# Patient Record
Sex: Female | Born: 1980 | Race: Black or African American | Hispanic: No | Marital: Married | Smoking: Never smoker
Health system: Southern US, Community
[De-identification: ages and names within clinical notes are randomized; demographics above are authoritative.]

## PROBLEM LIST (undated history)

## (undated) ENCOUNTER — Inpatient Hospital Stay (HOSPITAL_COMMUNITY): Payer: Self-pay

## (undated) DIAGNOSIS — R768 Other specified abnormal immunological findings in serum: Secondary | ICD-10-CM

## (undated) DIAGNOSIS — N971 Female infertility of tubal origin: Secondary | ICD-10-CM

## (undated) DIAGNOSIS — Z3183 Encounter for assisted reproductive fertility procedure cycle: Secondary | ICD-10-CM

## (undated) DIAGNOSIS — R51 Headache: Secondary | ICD-10-CM

## (undated) DIAGNOSIS — O00101 Right tubal pregnancy without intrauterine pregnancy: Secondary | ICD-10-CM

## (undated) DIAGNOSIS — D649 Anemia, unspecified: Secondary | ICD-10-CM

## (undated) DIAGNOSIS — E282 Polycystic ovarian syndrome: Secondary | ICD-10-CM

## (undated) DIAGNOSIS — I10 Essential (primary) hypertension: Secondary | ICD-10-CM

## (undated) DIAGNOSIS — F419 Anxiety disorder, unspecified: Secondary | ICD-10-CM

## (undated) DIAGNOSIS — N839 Noninflammatory disorder of ovary, fallopian tube and broad ligament, unspecified: Secondary | ICD-10-CM

## (undated) DIAGNOSIS — F32A Depression, unspecified: Secondary | ICD-10-CM

## (undated) HISTORY — DX: Right tubal pregnancy without intrauterine pregnancy: O00.101

## (undated) HISTORY — DX: Depression, unspecified: F32.A

## (undated) HISTORY — DX: Noninflammatory disorder of ovary, fallopian tube and broad ligament, unspecified: N83.9

## (undated) HISTORY — DX: Anxiety disorder, unspecified: F41.9

## (undated) HISTORY — DX: Encounter for assisted reproductive fertility procedure cycle: Z31.83

## (undated) HISTORY — DX: Headache: R51

## (undated) HISTORY — DX: Other specified abnormal immunological findings in serum: R76.8

---

## 2002-02-10 DIAGNOSIS — G4730 Sleep apnea, unspecified: Secondary | ICD-10-CM

## 2002-02-10 HISTORY — DX: Sleep apnea, unspecified: G47.30

## 2002-02-10 HISTORY — PX: GASTRIC BYPASS: SHX52

## 2005-09-13 ENCOUNTER — Emergency Department (HOSPITAL_COMMUNITY): Admission: EM | Admit: 2005-09-13 | Discharge: 2005-09-13 | Payer: Self-pay | Admitting: *Deleted

## 2005-09-16 ENCOUNTER — Emergency Department: Payer: Self-pay | Admitting: Emergency Medicine

## 2005-10-18 ENCOUNTER — Emergency Department: Payer: Self-pay | Admitting: Emergency Medicine

## 2005-11-01 ENCOUNTER — Emergency Department: Payer: Self-pay | Admitting: Emergency Medicine

## 2005-12-10 ENCOUNTER — Emergency Department: Payer: Self-pay | Admitting: Internal Medicine

## 2005-12-13 ENCOUNTER — Emergency Department: Payer: Self-pay

## 2006-01-27 ENCOUNTER — Emergency Department: Payer: Self-pay | Admitting: Emergency Medicine

## 2008-03-13 ENCOUNTER — Emergency Department: Payer: Self-pay | Admitting: Emergency Medicine

## 2009-05-11 ENCOUNTER — Ambulatory Visit: Payer: Self-pay | Admitting: Internal Medicine

## 2009-09-30 ENCOUNTER — Emergency Department: Payer: Self-pay | Admitting: Internal Medicine

## 2010-02-11 ENCOUNTER — Emergency Department: Payer: Self-pay | Admitting: Emergency Medicine

## 2010-03-26 ENCOUNTER — Other Ambulatory Visit (HOSPITAL_COMMUNITY): Payer: Self-pay | Admitting: Obstetrics & Gynecology

## 2010-03-26 DIAGNOSIS — N979 Female infertility, unspecified: Secondary | ICD-10-CM

## 2010-04-02 ENCOUNTER — Ambulatory Visit (HOSPITAL_COMMUNITY)
Admission: RE | Admit: 2010-04-02 | Discharge: 2010-04-02 | Disposition: A | Discharge status: Home / Self Care | Payer: BC Managed Care – PPO | Source: Ambulatory Visit | Attending: Obstetrics & Gynecology | Admitting: Obstetrics & Gynecology

## 2010-04-02 DIAGNOSIS — N979 Female infertility, unspecified: Secondary | ICD-10-CM

## 2010-07-17 ENCOUNTER — Other Ambulatory Visit (HOSPITAL_COMMUNITY): Payer: BC Managed Care – PPO

## 2010-07-17 ENCOUNTER — Encounter (HOSPITAL_COMMUNITY): Payer: BC Managed Care – PPO

## 2010-07-17 ENCOUNTER — Other Ambulatory Visit: Payer: Self-pay | Admitting: Obstetrics and Gynecology

## 2010-07-17 LAB — CBC
HCT: 31.3 % — ABNORMAL LOW (ref 36.0–46.0)
Hemoglobin: 9 g/dL — ABNORMAL LOW (ref 12.0–15.0)
MCH: 18.7 pg — ABNORMAL LOW (ref 26.0–34.0)
MCHC: 28.8 g/dL — ABNORMAL LOW (ref 30.0–36.0)
MCV: 65.1 fL — ABNORMAL LOW (ref 78.0–100.0)
Platelets: 256 10*3/uL (ref 150–400)
RBC: 4.81 MIL/uL (ref 3.87–5.11)
RDW: 19.8 % — ABNORMAL HIGH (ref 11.5–15.5)
WBC: 6.3 10*3/uL (ref 4.0–10.5)

## 2010-07-17 LAB — BASIC METABOLIC PANEL
BUN: 11 mg/dL (ref 6–23)
CO2: 27 mEq/L (ref 19–32)
Calcium: 8.9 mg/dL (ref 8.4–10.5)
Chloride: 107 mEq/L (ref 96–112)
Creatinine, Ser: 0.51 mg/dL (ref 0.4–1.2)
GFR calc Af Amer: >60 mL/min (ref >60)
GFR calc non Af Amer: >60 mL/min (ref >60)
Glucose, Bld: 76 mg/dL (ref 70–99)
Potassium: 4.3 mEq/L (ref 3.5–5.1)
Sodium: 140 mEq/L (ref 135–145)

## 2010-07-17 LAB — SURGICAL PCR SCREEN
MRSA, PCR: NEGATIVE
Staphylococcus aureus: NEGATIVE

## 2010-07-24 ENCOUNTER — Ambulatory Visit (HOSPITAL_COMMUNITY)
Admission: RE | Admit: 2010-07-24 | Discharge: 2010-07-24 | Disposition: A | Discharge status: Home / Self Care | Payer: BC Managed Care – PPO | Source: Ambulatory Visit | Attending: Obstetrics and Gynecology | Admitting: Obstetrics and Gynecology

## 2010-07-24 ENCOUNTER — Ambulatory Visit (HOSPITAL_COMMUNITY)
Admission: RE | Admit: 2010-07-24 | Payer: BC Managed Care – PPO | Source: Ambulatory Visit | Admitting: Obstetrics and Gynecology

## 2010-07-24 ENCOUNTER — Other Ambulatory Visit: Payer: Self-pay | Admitting: Obstetrics and Gynecology

## 2010-07-24 DIAGNOSIS — N840 Polyp of corpus uteri: Secondary | ICD-10-CM | POA: Insufficient documentation

## 2010-07-24 DIAGNOSIS — N7013 Chronic salpingitis and oophoritis: Secondary | ICD-10-CM | POA: Insufficient documentation

## 2010-07-24 HISTORY — PX: DIAGNOSTIC LAPAROSCOPY: SUR761

## 2010-07-24 LAB — PREGNANCY, URINE: Preg Test, Ur: NEGATIVE

## 2010-09-14 NOTE — Op Note (Signed)
NAMECAMERON, Marilyn Wilkinson                 ACCOUNT NO.:  [id number]  MEDICAL RECORD NO.:  [id number]  LOCATION:  [facility]                          FACILITY:  [facility]  PHYSICIAN:  Fermin Schwab, MD   DATE OF BIRTH:  1980/12/27  DATE OF PROCEDURE:  07/24/2010 DATE OF DISCHARGE:  07/24/2010                              OPERATIVE REPORT   PREOPERATIVE DIAGNOSES:  Bilateral hydrosalpinx, endometrial polyp.  POSTOPERATIVE DIAGNOSES:  Bilateral hydrosalpinx and pelvic adhesions.  PROCEDURE:  Hysteroscopy, endometrial biopsy, laparoscopy, electrosurgical lysis of adhesions, left salpingectomy, right salpingoneostomy.  SURGEON:  Fermin Schwab, MD  ASSISTANT:  None.  ANESTHESIA:  General endotracheal.  COMPLICATIONS:  None.  SPECIMENS:  Endometrial biopsy and left tube to Pathology.  ESTIMATED BLOOD LOSS:  Minimal.  FINDINGS:  On exam under anesthesia, the uterus was normal size anteverted.  No adnexal masses were palpable.  External genitalia and Bartholin, Skene, urethra were normal.  Vagina was normal.  Ectocervix was normal.  On lap hysteroscopy, endocervical canal was normal.  The uterus sounded to 8 cm.  Hysteroscopy revealed normal endometrial cavity without polyps.  Both tubal ostia were seen.  On laparoscopy, left peri-hepatic adhesions were noted between the liver capsule and diaphragm.  The right lobe of the liver was normal. Gallbladder, appendix, and rest of the diaphragm were normal.  Examination of the pelvic cavity revealed the following:  The anterior cul-de-sac was normal.  The uterus was grossly normal.  The posterior cul-de-sac was normal.  Both tubes revealed severe hydrosalpinx with comminution of the fallopian tubes and distal dilation.  The diameter on the right was 1.5 cm.  The diameter on the left hydrosalpinx was 1 cm. The left ovary had 6% cover with dense adhesions.  The left tube had the one-third cover with filmy adhesions.  In addition, the left tube  did not show filling with chromotubation.  The right tube was thin-walled and had one-third cover with filmy adhesions.  With chromotubation, the right tube did fill.  On salpingoneostomy 50% preservation of the tubal folds within the right tubal ampulla was confirmed.  DESCRIPTION OF PROCEDURE:  The patient was placed in dorsal supine position.  General endotracheal anesthesia was given, 1 gram of Kefzol was given intravenously for prophylaxis.  She was then placed in lithotomy position and operative field was created both on the abdomen and perineum and inside the vagina.  She was draped in a sterile manner. Exam under anesthesia showed above findings.  A Foley catheter was inserted into the bladder.  A slimline hysteroscope with normal saline via hysteroscopic pump was used for video hysteroscopy.  Above findings were noted.  As there were no polyps to remove, an endometrial biopsy was done.  This part of the procedure was terminated and the surgeon was re-gloved and operative field was created on the abdomen.  After preemptive anesthesia with 0.25% percent bupivacaine and 1:200,000 epinephrine for all incisions, a 5 mm infraumbilical skin incision was made, and a Veress needle was inserted.  After verifying its correct location and pneumoperitoneum was created with carbon dioxide.  A 5-mm trocar was inserted and video laparoscopy was started with the 30-degree laparoscope.  Above findings  were noted.  A 5-mm trocar was inserted under direct visualization in each lower quadrant for ancillary instruments.  Decision was made to remove the left hydrosalpinx that did not fill on chromotubation.  This was accomplished with Harmonic Ace device by first transecting the left tube at the uterotubal junction and carrying out successive coagulation and cutting maximum setting along the mesosalpinx, staying well away from the ovarian collaterals in the mesosalpinx.  The coagulations stayed  well away from the left infundibular pelvic ligament, trying not to harm ovarian circulation.  Using needle electrode with a 35 watts cutting current, the salpingo- oophorolysis was performed on the right adnexa.  A dilute vasopressin injection was injected (0.4 units per mL) in to the right mesosalpinx for tubal blanching.  Next, a cruciate incision was made on the most distal aspect of this clubbed tube where the fimbria were supposed to be using cutting current on a needle electrode and penetrating only through the muscularis layer of the thin-walled tube.  Using the hook scissors, this incision was carried into the tubal lumen sharply.  The resulting flaps of the tube were everted back and the serosal edges were secured to the distal ampullary serosa with a suture of 6-0 Vicryl, 6-0 Prolene. Chromotubation revealed free flow of indigo carmine from this tube, although portions of the tube were still ballooning during the process. Hemostasis was ensured and a slurry of Seprafilm was instilled over both adnexa after pelvis was copiously irrigated and aspirated.  Instruments were removed, and the gas was allowed to come out.  The incisions were approximated with Dermabond.  Estimated blood loss was less than 20 mL. The patient tolerated the procedure well and was transferred to recovery room in satisfactory condition.  She will take doxycycline 100 mg tablets twice daily for 7 days postoperatively because of apparent evidence of untreated pelvic inflammatory disease.     Fermin Schwab, MD     TY/MEDQ  D:  09/13/2010  T:  09/14/2010  Job:  [id number]  cc:   Dr. Gevena Cotton

## 2011-02-17 ENCOUNTER — Other Ambulatory Visit: Payer: Self-pay | Admitting: Obstetrics & Gynecology

## 2011-02-17 ENCOUNTER — Ambulatory Visit (HOSPITAL_COMMUNITY)
Admission: RE | Admit: 2011-02-17 | Discharge: 2011-02-17 | Disposition: A | Discharge status: Home / Self Care | Payer: BC Managed Care – PPO | Source: Ambulatory Visit | Attending: Obstetrics & Gynecology | Admitting: Obstetrics & Gynecology

## 2011-02-17 ENCOUNTER — Ambulatory Visit: Payer: BC Managed Care – PPO | Admitting: Gynecology

## 2011-02-17 VITALS — BP 123/88 | Wt 223.0 lb

## 2011-02-17 DIAGNOSIS — Z3689 Encounter for other specified antenatal screening: Secondary | ICD-10-CM | POA: Insufficient documentation

## 2011-02-17 DIAGNOSIS — Z3400 Encounter for supervision of normal first pregnancy, unspecified trimester: Secondary | ICD-10-CM

## 2011-02-17 DIAGNOSIS — O3680X0 Pregnancy with inconclusive fetal viability, not applicable or unspecified: Secondary | ICD-10-CM

## 2011-02-17 LAB — POCT URINE PREGNANCY: Preg Test, Ur: POSITIVE

## 2011-02-18 LAB — OBSTETRIC PANEL
Antibody Screen: NEGATIVE
Basophils Absolute: 0 10*3/uL (ref 0.0–0.1)
Basophils Relative: 0 % (ref 0–1)
Eosinophils Absolute: 0.2 10*3/uL (ref 0.0–0.7)
Eosinophils Relative: 2 % (ref 0–5)
HCT: 37.4 % (ref 36.0–46.0)
Hemoglobin: 11.5 g/dL — ABNORMAL LOW (ref 12.0–15.0)
Hepatitis B Surface Ag: NEGATIVE
Lymphocytes Relative: 33 % (ref 12–46)
Lymphs Abs: 3.6 10*3/uL (ref 0.7–4.0)
MCH: 21.1 pg — ABNORMAL LOW (ref 26.0–34.0)
MCHC: 30.7 g/dL (ref 30.0–36.0)
MCV: 68.8 fL — ABNORMAL LOW (ref 78.0–100.0)
Monocytes Absolute: 1.1 10*3/uL — ABNORMAL HIGH (ref 0.1–1.0)
Monocytes Relative: 10 % (ref 3–12)
Neutro Abs: 6.2 10*3/uL (ref 1.7–7.7)
Neutrophils Relative %: 56 % (ref 43–77)
Platelets: 307 10*3/uL (ref 150–400)
RBC: 5.44 MIL/uL — ABNORMAL HIGH (ref 3.87–5.11)
RDW: 22.1 % — ABNORMAL HIGH (ref 11.5–15.5)
Rh Type: POSITIVE
Rubella: 193.7 IU/mL — ABNORMAL HIGH
WBC: 11.1 10*3/uL — ABNORMAL HIGH (ref 4.0–10.5)

## 2011-02-18 LAB — HIV ANTIBODY (ROUTINE TESTING W REFLEX): HIV: NONREACTIVE

## 2011-02-18 LAB — HEPATITIS C ANTIBODY: HCV Ab: NEGATIVE

## 2011-02-19 LAB — CULTURE, URINE COMPREHENSIVE
Colony Count: NO GROWTH
Organism ID, Bacteria: NO GROWTH

## 2011-03-03 ENCOUNTER — Ambulatory Visit (INDEPENDENT_AMBULATORY_CARE_PROVIDER_SITE_OTHER): Payer: BC Managed Care – PPO | Admitting: Family Medicine

## 2011-03-03 ENCOUNTER — Encounter: Payer: Self-pay | Admitting: Family Medicine

## 2011-03-03 DIAGNOSIS — Z1272 Encounter for screening for malignant neoplasm of vagina: Secondary | ICD-10-CM

## 2011-03-03 DIAGNOSIS — O30049 Twin pregnancy, dichorionic/diamniotic, unspecified trimester: Secondary | ICD-10-CM | POA: Insufficient documentation

## 2011-03-03 DIAGNOSIS — O30009 Twin pregnancy, unspecified number of placenta and unspecified number of amniotic sacs, unspecified trimester: Secondary | ICD-10-CM

## 2011-03-03 DIAGNOSIS — Z113 Encounter for screening for infections with a predominantly sexual mode of transmission: Secondary | ICD-10-CM

## 2011-03-03 DIAGNOSIS — E282 Polycystic ovarian syndrome: Secondary | ICD-10-CM

## 2011-03-03 MED ORDER — PROMETHAZINE HCL 25 MG PO TABS: 25.0000 mg | ORAL_TABLET | Freq: Four times a day (QID) | ORAL | Status: AC | PRN

## 2011-03-03 NOTE — Patient Instructions (Signed)
Pregnancy - First Trimester During sexual intercourse, millions of sperm go into the vagina. Only 1 sperm will penetrate and fertilize the female egg while it is in the Fallopian tube. One week later, the fertilized egg implants into the wall of the uterus. An embryo begins to develop into a baby. At 6 to 8 weeks, the eyes and face are formed and the heartbeat can be seen on ultrasound. At the end of 12 weeks (first trimester), all the baby's organs are formed. Now that you are pregnant, you will want to do everything you can to have a healthy baby. Two of the most important things are to get good prenatal care and follow your caregiver's instructions. Prenatal care is all the medical care you receive before the baby's birth. It is given to prevent, find, and treat problems during the pregnancy and childbirth. PRENATAL EXAMS  During prenatal visits, your weight, blood pressure and urine are checked. This is done to make sure you are healthy and progressing normally during the pregnancy.   A pregnant woman should gain 25 to 35 pounds during the pregnancy. However, if you are over weight or underweight, your caregiver will advise you regarding your weight.   Your caregiver will ask and answer questions for you.   Blood work, cervical cultures, other necessary tests and a Pap test are done during your prenatal exams. These tests are done to check on your health and the probable health of your baby. Tests are strongly recommended and done for HIV with your permission. This is the virus that causes AIDS. These tests are done because medications can be given to help prevent your baby from being born with this infection should you have been infected without knowing it. Blood work is also used to find out your blood type, previous infections and follow your blood levels (hemoglobin).   Low hemoglobin (anemia) is common during pregnancy. Iron and vitamins are given to help prevent this. Later in the pregnancy,  blood tests for diabetes will be done along with any other tests if any problems develop. You may need tests to make sure you and the baby are doing well.   You may need other tests to make sure you and the baby are doing well.  CHANGES DURING THE FIRST TRIMESTER (THE FIRST 3 MONTHS OF PREGNANCY) Your body goes through many changes during pregnancy. They vary from person to person. Talk to your caregiver about changes you notice and are concerned about. Changes can include:  Your menstrual period stops.   The egg and sperm carry the genes that determine what you look like. Genes from you and your partner are forming a baby. The female genes determine whether the baby is a boy or a girl.   Your body increases in girth and you may feel bloated.   Feeling sick to your stomach (nauseous) and throwing up (vomiting). If the vomiting is uncontrollable, call your caregiver.   Your breasts will begin to enlarge and become tender.   Your nipples may stick out more and become darker.   The need to urinate more. Painful urination may mean you have a bladder infection.   Tiring easily.   Loss of appetite.   Cravings for certain kinds of food.   At first, you may gain or lose a couple of pounds.   You may have changes in your emotions from day to day (excited to be pregnant or concerned something may go wrong with the pregnancy and baby).     You may have more vivid and strange dreams.  HOME CARE INSTRUCTIONS   It is very important to avoid all smoking, alcohol and un-prescribed drugs during your pregnancy. These affect the formation and growth of the baby. Avoid chemicals while pregnant to ensure the delivery of a healthy infant.   Start your prenatal visits by the 12th week of pregnancy. They are usually scheduled monthly at first, then more often in the last 2 months before delivery. Keep your caregiver's appointments. Follow your caregiver's instructions regarding medication use, blood and lab  tests, exercise, and diet.   During pregnancy, you are providing food for you and your baby. Eat regular, well-balanced meals. Choose foods such as meat, fish, milk and other low fat dairy products, vegetables, fruits, and whole-grain breads and cereals. Your caregiver will tell you of the ideal weight gain.   You can help morning sickness by keeping soda crackers at the bedside. Eat a couple before arising in the morning. You may want to use the crackers without salt on them.   Eating 4 to 5 small meals rather than 3 large meals a day also may help the nausea and vomiting.   Drinking liquids between meals instead of during meals also seems to help nausea and vomiting.   A physical sexual relationship may be continued throughout pregnancy if there are no other problems. Problems may be early (premature) leaking of amniotic fluid from the membranes, vaginal bleeding, or belly (abdominal) pain.   Exercise regularly if there are no restrictions. Check with your caregiver or physical therapist if you are unsure of the safety of some of your exercises. Greater weight gain will occur in the last 2 trimesters of pregnancy. Exercising will help:   Control your weight.   Keep you in shape.   Prepare you for labor and delivery.   Help you lose your pregnancy weight after you deliver your baby.   Wear a good support or jogging bra for breast tenderness during pregnancy. This may help if worn during sleep too.   Ask when prenatal classes are available. Begin classes when they are offered.   Do not use hot tubs, steam rooms or saunas.   Wear your seat belt when driving. This protects you and your baby if you are in an accident.   Avoid raw meat, uncooked cheese, cat litter boxes and soil used by cats throughout the pregnancy. These carry germs that can cause birth defects in the baby.   The first trimester is a good time to visit your dentist for your dental health. Getting your teeth cleaned is  OK. Use a softer toothbrush and brush gently during pregnancy.   Ask for help if you have financial, counseling or nutritional needs during pregnancy. Your caregiver will be able to offer counseling for these needs as well as refer you for other special needs.   Do not take any medications or herbs unless told by your caregiver.   Inform your caregiver if there is any mental or physical domestic violence.   Make a list of emergency phone numbers of family, friends, hospital, and police and fire departments.   Write down your questions. Take them to your prenatal visit.   Do not douche.   Do not cross your legs.   If you have to stand for long periods of time, rotate you feet or take small steps in a circle.   You may have more vaginal secretions that may require a sanitary pad. Do not use   tampons or scented sanitary pads.  MEDICATIONS AND DRUG USE IN PREGNANCY  Take prenatal vitamins as directed. The vitamin should contain 1 milligram of folic acid. Keep all vitamins out of reach of children. Only a couple vitamins or tablets containing iron may be fatal to a baby or young child when ingested.   Avoid use of all medications, including herbs, over-the-counter medications, not prescribed or suggested by your caregiver. Only take over-the-counter or prescription medicines for pain, discomfort, or fever as directed by your caregiver. Do not use aspirin, ibuprofen, or naproxen unless directed by your caregiver.   Let your caregiver also know about herbs you may be using.   Alcohol is related to a number of birth defects. This includes fetal alcohol syndrome. All alcohol, in any form, should be avoided completely. Smoking will cause low birth rate and premature babies.   Street or illegal drugs are very harmful to the baby. They are absolutely forbidden. A baby born to an addicted mother will be addicted at birth. The baby will go through the same withdrawal an adult does.   Let your  caregiver know about any medications that you have to take and for what reason you take them.  MISCARRIAGE IS COMMON DURING PREGNANCY A miscarriage does not mean you did something wrong. It is not a reason to worry about getting pregnant again. Your caregiver will help you with questions you may have. If you have a miscarriage, you may need minor surgery. SEEK MEDICAL CARE IF:  You have any concerns or worries during your pregnancy. It is better to call with your questions if you feel they cannot wait, rather than worry about them. SEEK IMMEDIATE MEDICAL CARE IF:   An unexplained oral temperature above 102 F (38.9 C) develops, or as your caregiver suggests.   You have leaking of fluid from the vagina (birth canal). If leaking membranes are suspected, take your temperature and inform your caregiver of this when you call.   There is vaginal spotting or bleeding. Notify your caregiver of the amount and how many pads are used.   You develop a bad smelling vaginal discharge with a change in the color.   You continue to feel sick to your stomach (nauseated) and have no relief from remedies suggested. You vomit blood or coffee ground-like materials.   You lose more than 2 pounds of weight in 1 week.   You gain more than 2 pounds of weight in 1 week and you notice swelling of your face, hands, feet, or legs.   You gain 5 pounds or more in 1 week (even if you do not have swelling of your hands, face, legs, or feet).   You get exposed to German measles and have never had them.   You are exposed to fifth disease or chickenpox.   You develop belly (abdominal) pain. Round ligament discomfort is a common non-cancerous (benign) cause of abdominal pain in pregnancy. Your caregiver still must evaluate this.   You develop headache, fever, diarrhea, pain with urination, or shortness of breath.   You fall or are in a car accident or have any kind of trauma.   There is mental or physical violence in  your home.  Document Released: 01/21/2001 Document Revised: 10/09/2010 Document Reviewed: 07/25/2008 [employer] Patient Information 2012 [employer]. Breastfeeding BENEFITS OF BREASTFEEDING For the baby  The first milk (colostrum) helps the baby's digestive system function better.   There are antibodies from the mother in the milk that help   the baby fight off infections.   The baby has a lower incidence of asthma, allergies, and SIDS (sudden infant death syndrome).   The nutrients in breast milk are better than formulas for the baby and helps the baby's brain grow better.   Babies who breastfeed have less gas, colic, and constipation.  For the mother  Breastfeeding helps develop a very special bond between mother and baby.   It is more convenient, always available at the correct temperature and cheaper than formula feeding.   It burns calories in the mother and helps with losing weight that was gained during pregnancy.   It makes the uterus contract back down to normal size faster and slows bleeding following delivery.   Breastfeeding mothers have a lower risk of developing breast cancer.  NURSE FREQUENTLY  A healthy, full-term baby may breastfeed as often as every hour or space his or her feedings to every 3 hours.   How often to nurse will vary from baby to baby. Watch your baby for signs of hunger, not the clock.   Nurse as often as the baby requests, or when you feel the need to reduce the fullness of your breasts.   Awaken the baby if it has been 3 to 4 hours since the last feeding.   Frequent feeding will help the mother make more milk and will prevent problems like sore nipples and engorgement of the breasts.  BABY'S POSITION AT THE BREAST  Whether lying down or sitting, be sure that the baby's tummy is facing your tummy.   Support the breast with 4 fingers underneath the breast and the thumb above. Make sure your fingers are well away from the nipple and baby's  mouth.   Stroke the baby's lips and cheek closest to the breast gently with your finger or nipple.   When the baby's mouth is open wide enough, place all of your nipple and as much of the dark area around the nipple as possible into your baby's mouth.   Pull the baby in close so the tip of the nose and the baby's cheeks touch the breast during the feeding.  FEEDINGS  The length of each feeding varies from baby to baby and from feeding to feeding.   The baby must suck about 2 to 3 minutes for your milk to get to him or her. This is called a "let down." For this reason, allow the baby to feed on each breast as long as he or she wants. Your baby will end the feeding when he or she has received the right balance of nutrients.   To break the suction, put your finger into the corner of the baby's mouth and slide it between his or her gums before removing your breast from his or her mouth. This will help prevent sore nipples.  REDUCING BREAST ENGORGEMENT  In the first week after your baby is born, you may experience signs of breast engorgement. When breasts are engorged, they feel heavy, warm, full, and may be tender to the touch. You can reduce engorgement if you:   Nurse frequently, every 2 to 3 hours. Mothers who breastfeed early and often have fewer problems with engorgement.   Place light ice packs on your breasts between feedings. This reduces swelling. Wrap the ice packs in a lightweight towel to protect your skin.   Apply moist hot packs to your breast for 5 to 10 minutes before each feeding. This increases circulation and helps the milk flow.     Gently massage your breast before and during the feeding.   Make sure that the baby empties at least one breast at every feeding before switching sides.   Use a breast pump to empty the breasts if your baby is sleepy or not nursing well. You may also want to pump if you are returning to work or or you feel you are getting engorged.   Avoid  bottle feeds, pacifiers or supplemental feedings of water or juice in place of breastfeeding.   Be sure the baby is latched on and positioned properly while breastfeeding.   Prevent fatigue, stress, and anemia.   Wear a supportive bra, avoiding underwire styles.   Eat a balanced diet with enough fluids.  If you follow these suggestions, your engorgement should improve in 24 to 48 hours. If you are still experiencing difficulty, call your lactation consultant or caregiver. IS MY BABY GETTING ENOUGH MILK? Sometimes, mothers worry about whether their babies are getting enough milk. You can be assured that your baby is getting enough milk if:  The baby is actively sucking and you hear swallowing.   The baby nurses at least 8 to 12 times in a 24 hour time period. Nurse your baby until he or she unlatches or falls asleep at the first breast (at least 10 to 20 minutes), then offer the second side.   The baby is wetting 5 to 6 disposable diapers (6 to 8 cloth diapers) in a 24 hour period by 5 to 6 days of age.   The baby is having at least 2 to 3 stools every 24 hours for the first few months. Breast milk is all the food your baby needs. It is not necessary for your baby to have water or formula. In fact, to help your breasts make more milk, it is best not to give your baby supplemental feedings during the early weeks.   The stool should be soft and yellow.   The baby should gain 4 to 7 ounces per week after he is 4 days old.  TAKE CARE OF YOURSELF Take care of your breasts by:  Bathing or showering daily.   Avoiding the use of soaps on your nipples.   Start feedings on your left breast at one feeding and on your right breast at the next feeding.   You will notice an increase in your milk supply 2 to 5 days after delivery. You may feel some discomfort from engorgement, which makes your breasts very firm and often tender. Engorgement "peaks" out within 24 to 48 hours. In the meantime, apply  warm moist towels to your breasts for 5 to 10 minutes before feeding. Gentle massage and expression of some milk before feeding will soften your breasts, making it easier for your baby to latch on. Wear a well fitting nursing bra and air dry your nipples for 10 to 15 minutes after each feeding.   Only use cotton bra pads.   Only use pure lanolin on your nipples after nursing. You do not need to wash it off before nursing.  Take care of yourself by:   Eating well-balanced meals and nutritious snacks.   Drinking milk, fruit juice, and water to satisfy your thirst (about 8 glasses a day).   Getting plenty of rest.   Increasing calcium in your diet (1200 mg a day).   Avoiding foods that you notice affect the baby in a bad way.  SEEK MEDICAL CARE IF:   You have any questions or difficulty   with breastfeeding.   You need help.   You have a hard, red, sore area on your breast, accompanied by a fever of 100.5 F (38.1 C) or more.   Your baby is too sleepy to eat well or is having trouble sleeping.   Your baby is wetting less than 6 diapers per day, by 5 days of age.   Your baby's skin or white part of his or her eyes is more yellow than it was in the hospital.   You feel depressed.  Document Released: 01/27/2005 Document Revised: 10/09/2010 Document Reviewed: 09/11/2008 [employer] Patient Information 2012 [employer]. 

## 2011-03-03 NOTE — Progress Notes (Signed)
   Subjective:    Marilyn Wilkinson is a G1P0 [redacted]w[redacted]d being seen today for her first obstetrical visit.  Her obstetrical history is significant for IVF. Patient does intend to breast feed. Pregnancy history fully reviewed.  Patient reports no complaints.  Filed Vitals:   03/03/11 1500  BP: 133/90  Weight: 229 lb (103.874 kg)    HISTORY: OB History    Grav Para Term Preterm Abortions TAB SAB Ect Mult Living   1              # Outc Date GA Lbr Len/2nd Wgt Sex Del Anes PTL Lv   1 CUR              Past Medical History  Diagnosis Date  . Headache     migraines  . Fallopian tube disorder     BLOCKAGE ON BILATERAL TUBE   Past Surgical History  Procedure Date  . Diagnostic laparoscopy 07/24/2010    REMOVAL OF BILATERAL TUBES BLOCKAGE  . Gastric bypass 2004   Family History  Problem Relation Age of Onset  . Asthma Mother   . Diabetes Mother   . Arthritis Mother   . Fibromyalgia Mother   . Hypertension Father   . Diabetes Father   . Hypertension Maternal Grandmother   . Arthritis Maternal Grandmother      Exam    Uterine Size: size equals dates 16 wk size  Pelvic Exam:    Perineum: No Hemorrhoids   Vulva: normal   Vagina:  normal mucosa   pH:    Cervix: anteverted and no cervical motion tenderness   Adnexa: normal adnexa   Bony Pelvis: average  System: Breast:  normal appearance, no masses or tenderness   Skin: normal coloration and turgor, no rashes    Neurologic: oriented, normal   Extremities: normal strength, tone, and muscle mass   HEENT sclera clear, anicteric   Mouth/Teeth mucous membranes moist, pharynx normal without lesions   Neck supple   Cardiovascular: regular rate and rhythm, no murmurs or gallops   Respiratory:  appears well, vitals normal, no respiratory distress, acyanotic, normal RR, ear and throat exam is normal, neck free of mass or lymphadenopathy, chest clear, no wheezing, crepitations, rhonchi, normal symmetric air entry   Abdomen: soft,  non-tender; bowel sounds normal; no masses,  no organomegaly   Urinary: urethral meatus normal      Assessment:    Pregnancy: G1P0 Patient Active Problem List  Diagnoses  . PCOS (polycystic ovarian syndrome)  . Dichorionic diamniotic twin pregnancy        Plan:     Initial labs drawn. Prenatal vitamins. Problem list reviewed and updated. Genetic Screening discussed First Screen: ordered.  Ultrasound discussed; fetal survey: discussed.  Follow up in 4 weeks. Metformin for now  Renly Roots S 03/03/2011

## 2011-03-10 NOTE — Progress Notes (Signed)
Spoke to patient . Patient aware of normal pap and culture result.

## 2011-03-12 ENCOUNTER — Ambulatory Visit (HOSPITAL_COMMUNITY): Admission: RE | Admit: 2011-03-12 | Payer: BC Managed Care – PPO | Source: Ambulatory Visit

## 2011-03-12 ENCOUNTER — Other Ambulatory Visit: Payer: Self-pay | Admitting: Family Medicine

## 2011-03-12 ENCOUNTER — Ambulatory Visit (HOSPITAL_COMMUNITY)
Admission: RE | Admit: 2011-03-12 | Discharge: 2011-03-12 | Disposition: A | Discharge status: Home / Self Care | Payer: BC Managed Care – PPO | Source: Ambulatory Visit | Attending: Family Medicine | Admitting: Family Medicine

## 2011-03-12 DIAGNOSIS — O30009 Twin pregnancy, unspecified number of placenta and unspecified number of amniotic sacs, unspecified trimester: Secondary | ICD-10-CM | POA: Insufficient documentation

## 2011-03-12 DIAGNOSIS — O30049 Twin pregnancy, dichorionic/diamniotic, unspecified trimester: Secondary | ICD-10-CM

## 2011-03-12 DIAGNOSIS — Z3689 Encounter for other specified antenatal screening: Secondary | ICD-10-CM | POA: Insufficient documentation

## 2011-03-12 DIAGNOSIS — O351XX0 Maternal care for (suspected) chromosomal abnormality in fetus, not applicable or unspecified: Secondary | ICD-10-CM | POA: Insufficient documentation

## 2011-03-12 DIAGNOSIS — O3510X0 Maternal care for (suspected) chromosomal abnormality in fetus, unspecified, not applicable or unspecified: Secondary | ICD-10-CM | POA: Insufficient documentation

## 2011-03-12 NOTE — Progress Notes (Signed)
Obstetric ultrasound performed today.  Adequate nuchal translucency measurements unable to be obtained.  Patient elected to plan for Quad Screen in second trimester.  Please see full report in [facility].  Repeat ultrasound in 6 weeks recommended to evaluate fetal anatomy.

## 2011-03-13 ENCOUNTER — Encounter: Payer: Self-pay | Admitting: Family Medicine

## 2011-03-18 NOTE — Progress Notes (Signed)
Encounter addended by: Marlana Latus, RN on: 03/18/2011 11:14 AM<BR>     Documentation filed: Episodes, Chief Complaint Section

## 2011-03-31 ENCOUNTER — Ambulatory Visit (INDEPENDENT_AMBULATORY_CARE_PROVIDER_SITE_OTHER): Payer: BC Managed Care – PPO | Admitting: Obstetrics & Gynecology

## 2011-03-31 VITALS — BP 119/84 | Wt 235.0 lb

## 2011-03-31 DIAGNOSIS — O30049 Twin pregnancy, dichorionic/diamniotic, unspecified trimester: Secondary | ICD-10-CM

## 2011-03-31 DIAGNOSIS — O30009 Twin pregnancy, unspecified number of placenta and unspecified number of amniotic sacs, unspecified trimester: Secondary | ICD-10-CM

## 2011-03-31 NOTE — Progress Notes (Signed)
No other complaints or concerns.  Fetal movement and labor precautions reviewed.  Quad screen next visit. Anatomy u/s at 18 weeks.

## 2011-03-31 NOTE — Patient Instructions (Signed)
Pregnancy - Second Trimester The second trimester of pregnancy (3 to 6 months) is a period of rapid growth for you and your baby. At the end of the sixth month, your baby is about 9 inches long and weighs 1 1/2 pounds. You will begin to feel the baby move between 18 and 20 weeks of the pregnancy. This is called quickening. Weight gain is faster. A clear fluid (colostrum) may leak out of your breasts. You may feel small contractions of the womb (uterus). This is known as false labor or Braxton-Hicks contractions. This is like a practice for labor when the baby is ready to be born. Usually, the problems with morning sickness have usually passed by the end of your first trimester. Some women develop small dark blotches (called cholasma, mask of pregnancy) on their face that usually goes away after the baby is born. Exposure to the sun makes the blotches worse. Acne may also develop in some pregnant women and pregnant women who have acne, may find that it goes away. PRENATAL EXAMS  Blood work may continue to be done during prenatal exams. These tests are done to check on your health and the probable health of your baby. Blood work is used to follow your blood levels (hemoglobin). Anemia (low hemoglobin) is common during pregnancy. Iron and vitamins are given to help prevent this. You will also be checked for diabetes between 24 and 28 weeks of the pregnancy. Some of the previous blood tests may be repeated.   The size of the uterus is measured during each visit. This is to make sure that the baby is continuing to grow properly according to the dates of the pregnancy.   Your blood pressure is checked every prenatal visit. This is to make sure you are not getting toxemia.   Your urine is checked to make sure you do not have an infection, diabetes or protein in the urine.   Your weight is checked often to make sure gains are happening at the suggested rate. This is to ensure that both you and your baby are  growing normally.   Sometimes, an ultrasound is performed to confirm the proper growth and development of the baby. This is a test which bounces harmless sound waves off the baby so your caregiver can more accurately determine due dates.  Sometimes, a specialized test is done on the amniotic fluid surrounding the baby. This test is called an amniocentesis. The amniotic fluid is obtained by sticking a needle into the belly (abdomen). This is done to check the chromosomes in instances where there is a concern about possible genetic problems with the baby. It is also sometimes done near the end of pregnancy if an early delivery is required. In this case, it is done to help make sure the baby's lungs are mature enough for the baby to live outside of the womb. CHANGES OCCURING IN THE SECOND TRIMESTER OF PREGNANCY Your body goes through many changes during pregnancy. They vary from person to person. Talk to your caregiver about changes you notice that you are concerned about.  During the second trimester, you will likely have an increase in your appetite. It is normal to have cravings for certain foods. This varies from person to person and pregnancy to pregnancy.   Your lower abdomen will begin to bulge.   You may have to urinate more often because the uterus and baby are pressing on your bladder. It is also common to get more bladder infections during pregnancy (  pain with urination). You can help this by drinking lots of fluids and emptying your bladder before and after intercourse.   You may begin to get stretch marks on your hips, abdomen, and breasts. These are normal changes in the body during pregnancy. There are no exercises or medications to take that prevent this change.   You may begin to develop swollen and bulging veins (varicose veins) in your legs. Wearing support hose, elevating your feet for 15 minutes, 3 to 4 times a day and limiting salt in your diet helps lessen the problem.    Heartburn may develop as the uterus grows and pushes up against the stomach. Antacids recommended by your caregiver helps with this problem. Also, eating smaller meals 4 to 5 times a day helps.   Constipation can be treated with a stool softener or adding bulk to your diet. Drinking lots of fluids, vegetables, fruits, and whole grains are helpful.   Exercising is also helpful. If you have been very active up until your pregnancy, most of these activities can be continued during your pregnancy. If you have been less active, it is helpful to start an exercise program such as walking.   Hemorrhoids (varicose veins in the rectum) may develop at the end of the second trimester. Warm sitz baths and hemorrhoid cream recommended by your caregiver helps hemorrhoid problems.   Backaches may develop during this time of your pregnancy. Avoid heavy lifting, wear low heal shoes and practice good posture to help with backache problems.   Some pregnant women develop tingling and numbness of their hand and fingers because of swelling and tightening of ligaments in the wrist (carpel tunnel syndrome). This goes away after the baby is born.   As your breasts enlarge, you may have to get a bigger bra. Get a comfortable, cotton, support bra. Do not get a nursing bra until the last month of the pregnancy if you will be nursing the baby.   You may get a dark line from your belly button to the pubic area called the linea nigra.   You may develop rosy cheeks because of increase blood flow to the face.   You may develop spider looking lines of the face, neck, arms and chest. These go away after the baby is born.  HOME CARE INSTRUCTIONS   It is extremely important to avoid all smoking, herbs, alcohol, and unprescribed drugs during your pregnancy. These chemicals affect the formation and growth of the baby. Avoid these chemicals throughout the pregnancy to ensure the delivery of a healthy infant.   Most of your home  care instructions are the same as suggested for the first trimester of your pregnancy. Keep your caregiver's appointments. Follow your caregiver's instructions regarding medication use, exercise and diet.   During pregnancy, you are providing food for you and your baby. Continue to eat regular, well-balanced meals. Choose foods such as meat, fish, milk and other low fat dairy products, vegetables, fruits, and whole-grain breads and cereals. Your caregiver will tell you of the ideal weight gain.   A physical sexual relationship may be continued up until near the end of pregnancy if there are no other problems. Problems could include early (premature) leaking of amniotic fluid from the membranes, vaginal bleeding, abdominal pain, or other medical or pregnancy problems.   Exercise regularly if there are no restrictions. Check with your caregiver if you are unsure of the safety of some of your exercises. The greatest weight gain will occur in the   last 2 trimesters of pregnancy. Exercise will help you:   Control your weight.   Get you in shape for labor and delivery.   Lose weight after you have the baby.   Wear a good support or jogging bra for breast tenderness during pregnancy. This may help if worn during sleep. Pads or tissues may be used in the bra if you are leaking colostrum.   Do not use hot tubs, steam rooms or saunas throughout the pregnancy.   Wear your seat belt at all times when driving. This protects you and your baby if you are in an accident.   Avoid raw meat, uncooked cheese, cat litter boxes and soil used by cats. These carry germs that can cause birth defects in the baby.   The second trimester is also a good time to visit your dentist for your dental health if this has not been done yet. Getting your teeth cleaned is OK. Use a soft toothbrush. Brush gently during pregnancy.   It is easier to loose urine during pregnancy. Tightening up and strengthening the pelvic muscles will  help with this problem. Practice stopping your urination while you are going to the bathroom. These are the same muscles you need to strengthen. It is also the muscles you would use as if you were trying to stop from passing gas. You can practice tightening these muscles up 10 times a set and repeating this about 3 times per day. Once you know what muscles to tighten up, do not perform these exercises during urination. It is more likely to contribute to an infection by backing up the urine.   Ask for help if you have financial, counseling or nutritional needs during pregnancy. Your caregiver will be able to offer counseling for these needs as well as refer you for other special needs.   Your skin may become oily. If so, wash your face with mild soap, use non-greasy moisturizer and oil or cream based makeup.  MEDICATIONS AND DRUG USE IN PREGNANCY  Take prenatal vitamins as directed. The vitamin should contain 1 milligram of folic acid. Keep all vitamins out of reach of children. Only a couple vitamins or tablets containing iron may be fatal to a baby or young child when ingested.   Avoid use of all medications, including herbs, over-the-counter medications, not prescribed or suggested by your caregiver. Only take over-the-counter or prescription medicines for pain, discomfort, or fever as directed by your caregiver. Do not use aspirin.   Let your caregiver also know about herbs you may be using.   Alcohol is related to a number of birth defects. This includes fetal alcohol syndrome. All alcohol, in any form, should be avoided completely. Smoking will cause low birth rate and premature babies.   Street or illegal drugs are very harmful to the baby. They are absolutely forbidden. A baby born to an addicted mother will be addicted at birth. The baby will go through the same withdrawal an adult does.  SEEK MEDICAL CARE IF:  You have any concerns or worries during your pregnancy. It is better to call with  your questions if you feel they cannot wait, rather than worry about them. SEEK IMMEDIATE MEDICAL CARE IF:   An unexplained oral temperature above 102 F (38.9 C) develops, or as your caregiver suggests.   You have leaking of fluid from the vagina (birth canal). If leaking membranes are suspected, take your temperature and tell your caregiver of this when you call.   There   is vaginal spotting, bleeding, or passing clots. Tell your caregiver of the amount and how many pads are used. Light spotting in pregnancy is common, especially following intercourse.   You develop a bad smelling vaginal discharge with a change in the color from clear to white.   You continue to feel sick to your stomach (nauseated) and have no relief from remedies suggested. You vomit blood or coffee ground-like materials.   You lose more than 2 pounds of weight or gain more than 2 pounds of weight over 1 week, or as suggested by your caregiver.   You notice swelling of your face, hands, feet, or legs.   You get exposed to German measles and have never had them.   You are exposed to fifth disease or chickenpox.   You develop belly (abdominal) pain. Round ligament discomfort is a common non-cancerous (benign) cause of abdominal pain in pregnancy. Your caregiver still must evaluate you.   You develop a bad headache that does not go away.   You develop fever, diarrhea, pain with urination, or shortness of breath.   You develop visual problems, blurry, or double vision.   You fall or are in a car accident or any kind of trauma.   There is mental or physical violence at home.  Document Released: 01/21/2001 Document Revised: 10/09/2010 Document Reviewed: 07/26/2008 [employer] Patient Information 2012 [employer]. 

## 2011-04-14 ENCOUNTER — Ambulatory Visit (INDEPENDENT_AMBULATORY_CARE_PROVIDER_SITE_OTHER): Payer: BC Managed Care – PPO | Admitting: Obstetrics & Gynecology

## 2011-04-14 VITALS — BP 123/99 | Wt 241.0 lb

## 2011-04-14 DIAGNOSIS — Z3480 Encounter for supervision of other normal pregnancy, unspecified trimester: Secondary | ICD-10-CM

## 2011-04-14 DIAGNOSIS — Z23 Encounter for immunization: Secondary | ICD-10-CM

## 2011-04-14 DIAGNOSIS — O30049 Twin pregnancy, dichorionic/diamniotic, unspecified trimester: Secondary | ICD-10-CM

## 2011-04-14 DIAGNOSIS — E669 Obesity, unspecified: Secondary | ICD-10-CM

## 2011-04-14 DIAGNOSIS — R358 Other polyuria: Secondary | ICD-10-CM

## 2011-04-14 DIAGNOSIS — R3589 Other polyuria: Secondary | ICD-10-CM

## 2011-04-14 NOTE — Progress Notes (Signed)
Routine visit. No problems. Wants her flu shot today. Has anatomy u/s scheduled next week. Will get early 1 hour glucola at [facility].

## 2011-04-14 NOTE — Progress Notes (Signed)
Addended by: Allie Bossier on: 04/14/2011 01:59 PM   Modules accepted: Orders

## 2011-04-21 ENCOUNTER — Ambulatory Visit (HOSPITAL_COMMUNITY)
Admission: RE | Admit: 2011-04-21 | Discharge: 2011-04-21 | Disposition: A | Discharge status: Home / Self Care | Payer: BC Managed Care – PPO | Source: Ambulatory Visit | Attending: Obstetrics & Gynecology | Admitting: Obstetrics & Gynecology

## 2011-04-21 DIAGNOSIS — Z363 Encounter for antenatal screening for malformations: Secondary | ICD-10-CM | POA: Insufficient documentation

## 2011-04-21 DIAGNOSIS — O30049 Twin pregnancy, dichorionic/diamniotic, unspecified trimester: Secondary | ICD-10-CM

## 2011-04-21 DIAGNOSIS — O358XX0 Maternal care for other (suspected) fetal abnormality and damage, not applicable or unspecified: Secondary | ICD-10-CM | POA: Insufficient documentation

## 2011-04-21 DIAGNOSIS — Z1389 Encounter for screening for other disorder: Secondary | ICD-10-CM | POA: Insufficient documentation

## 2011-04-21 DIAGNOSIS — O30009 Twin pregnancy, unspecified number of placenta and unspecified number of amniotic sacs, unspecified trimester: Secondary | ICD-10-CM | POA: Insufficient documentation

## 2011-04-23 ENCOUNTER — Emergency Department: Payer: Self-pay

## 2011-05-12 ENCOUNTER — Ambulatory Visit (INDEPENDENT_AMBULATORY_CARE_PROVIDER_SITE_OTHER): Payer: BC Managed Care – PPO | Admitting: Family Medicine

## 2011-05-12 VITALS — BP 123/94 | Ht 66.0 in | Wt 250.0 lb

## 2011-05-12 DIAGNOSIS — O30009 Twin pregnancy, unspecified number of placenta and unspecified number of amniotic sacs, unspecified trimester: Secondary | ICD-10-CM

## 2011-05-12 DIAGNOSIS — Z3480 Encounter for supervision of other normal pregnancy, unspecified trimester: Secondary | ICD-10-CM

## 2011-05-12 DIAGNOSIS — O30049 Twin pregnancy, dichorionic/diamniotic, unspecified trimester: Secondary | ICD-10-CM

## 2011-05-12 NOTE — Patient Instructions (Signed)
Pregnancy - Second Trimester The second trimester of pregnancy (3 to 6 months) is a period of rapid growth for you and your baby. At the end of the sixth month, your baby is about 9 inches long and weighs 1 1/2 pounds. You will begin to feel the baby move between 18 and 20 weeks of the pregnancy. This is called quickening. Weight gain is faster. A clear fluid (colostrum) may leak out of your breasts. You may feel small contractions of the womb (uterus). This is known as false labor or Braxton-Hicks contractions. This is like a practice for labor when the baby is ready to be born. Usually, the problems with morning sickness have usually passed by the end of your first trimester. Some women develop small dark blotches (called cholasma, mask of pregnancy) on their face that usually goes away after the baby is born. Exposure to the sun makes the blotches worse. Acne may also develop in some pregnant women and pregnant women who have acne, may find that it goes away. PRENATAL EXAMS  Blood work may continue to be done during prenatal exams. These tests are done to check on your health and the probable health of your baby. Blood work is used to follow your blood levels (hemoglobin). Anemia (low hemoglobin) is common during pregnancy. Iron and vitamins are given to help prevent this. You will also be checked for diabetes between 24 and 28 weeks of the pregnancy. Some of the previous blood tests may be repeated.   The size of the uterus is measured during each visit. This is to make sure that the baby is continuing to grow properly according to the dates of the pregnancy.   Your blood pressure is checked every prenatal visit. This is to make sure you are not getting toxemia.   Your urine is checked to make sure you do not have an infection, diabetes or protein in the urine.   Your weight is checked often to make sure gains are happening at the suggested rate. This is to ensure that both you and your baby are  growing normally.   Sometimes, an ultrasound is performed to confirm the proper growth and development of the baby. This is a test which bounces harmless sound waves off the baby so your caregiver can more accurately determine due dates.  Sometimes, a specialized test is done on the amniotic fluid surrounding the baby. This test is called an amniocentesis. The amniotic fluid is obtained by sticking a needle into the belly (abdomen). This is done to check the chromosomes in instances where there is a concern about possible genetic problems with the baby. It is also sometimes done near the end of pregnancy if an early delivery is required. In this case, it is done to help make sure the baby's lungs are mature enough for the baby to live outside of the womb. CHANGES OCCURING IN THE SECOND TRIMESTER OF PREGNANCY Your body goes through many changes during pregnancy. They vary from person to person. Talk to your caregiver about changes you notice that you are concerned about.  During the second trimester, you will likely have an increase in your appetite. It is normal to have cravings for certain foods. This varies from person to person and pregnancy to pregnancy.   Your lower abdomen will begin to bulge.   You may have to urinate more often because the uterus and baby are pressing on your bladder. It is also common to get more bladder infections during pregnancy (  pain with urination). You can help this by drinking lots of fluids and emptying your bladder before and after intercourse.   You may begin to get stretch marks on your hips, abdomen, and breasts. These are normal changes in the body during pregnancy. There are no exercises or medications to take that prevent this change.   You may begin to develop swollen and bulging veins (varicose veins) in your legs. Wearing support hose, elevating your feet for 15 minutes, 3 to 4 times a day and limiting salt in your diet helps lessen the problem.    Heartburn may develop as the uterus grows and pushes up against the stomach. Antacids recommended by your caregiver helps with this problem. Also, eating smaller meals 4 to 5 times a day helps.   Constipation can be treated with a stool softener or adding bulk to your diet. Drinking lots of fluids, vegetables, fruits, and whole grains are helpful.   Exercising is also helpful. If you have been very active up until your pregnancy, most of these activities can be continued during your pregnancy. If you have been less active, it is helpful to start an exercise program such as walking.   Hemorrhoids (varicose veins in the rectum) may develop at the end of the second trimester. Warm sitz baths and hemorrhoid cream recommended by your caregiver helps hemorrhoid problems.   Backaches may develop during this time of your pregnancy. Avoid heavy lifting, wear low heal shoes and practice good posture to help with backache problems.   Some pregnant women develop tingling and numbness of their hand and fingers because of swelling and tightening of ligaments in the wrist (carpel tunnel syndrome). This goes away after the baby is born.   As your breasts enlarge, you may have to get a bigger bra. Get a comfortable, cotton, support bra. Do not get a nursing bra until the last month of the pregnancy if you will be nursing the baby.   You may get a dark line from your belly button to the pubic area called the linea nigra.   You may develop rosy cheeks because of increase blood flow to the face.   You may develop spider looking lines of the face, neck, arms and chest. These go away after the baby is born.  HOME CARE INSTRUCTIONS   It is extremely important to avoid all smoking, herbs, alcohol, and unprescribed drugs during your pregnancy. These chemicals affect the formation and growth of the baby. Avoid these chemicals throughout the pregnancy to ensure the delivery of a healthy infant.   Most of your home  care instructions are the same as suggested for the first trimester of your pregnancy. Keep your caregiver's appointments. Follow your caregiver's instructions regarding medication use, exercise and diet.   During pregnancy, you are providing food for you and your baby. Continue to eat regular, well-balanced meals. Choose foods such as meat, fish, milk and other low fat dairy products, vegetables, fruits, and whole-grain breads and cereals. Your caregiver will tell you of the ideal weight gain.   A physical sexual relationship may be continued up until near the end of pregnancy if there are no other problems. Problems could include early (premature) leaking of amniotic fluid from the membranes, vaginal bleeding, abdominal pain, or other medical or pregnancy problems.   Exercise regularly if there are no restrictions. Check with your caregiver if you are unsure of the safety of some of your exercises. The greatest weight gain will occur in the   last 2 trimesters of pregnancy. Exercise will help you:   Control your weight.   Get you in shape for labor and delivery.   Lose weight after you have the baby.   Wear a good support or jogging bra for breast tenderness during pregnancy. This may help if worn during sleep. Pads or tissues may be used in the bra if you are leaking colostrum.   Do not use hot tubs, steam rooms or saunas throughout the pregnancy.   Wear your seat belt at all times when driving. This protects you and your baby if you are in an accident.   Avoid raw meat, uncooked cheese, cat litter boxes and soil used by cats. These carry germs that can cause birth defects in the baby.   The second trimester is also a good time to visit your dentist for your dental health if this has not been done yet. Getting your teeth cleaned is OK. Use a soft toothbrush. Brush gently during pregnancy.   It is easier to loose urine during pregnancy. Tightening up and strengthening the pelvic muscles will  help with this problem. Practice stopping your urination while you are going to the bathroom. These are the same muscles you need to strengthen. It is also the muscles you would use as if you were trying to stop from passing gas. You can practice tightening these muscles up 10 times a set and repeating this about 3 times per day. Once you know what muscles to tighten up, do not perform these exercises during urination. It is more likely to contribute to an infection by backing up the urine.   Ask for help if you have financial, counseling or nutritional needs during pregnancy. Your caregiver will be able to offer counseling for these needs as well as refer you for other special needs.   Your skin may become oily. If so, wash your face with mild soap, use non-greasy moisturizer and oil or cream based makeup.  MEDICATIONS AND DRUG USE IN PREGNANCY  Take prenatal vitamins as directed. The vitamin should contain 1 milligram of folic acid. Keep all vitamins out of reach of children. Only a couple vitamins or tablets containing iron may be fatal to a baby or young child when ingested.   Avoid use of all medications, including herbs, over-the-counter medications, not prescribed or suggested by your caregiver. Only take over-the-counter or prescription medicines for pain, discomfort, or fever as directed by your caregiver. Do not use aspirin.   Let your caregiver also know about herbs you may be using.   Alcohol is related to a number of birth defects. This includes fetal alcohol syndrome. All alcohol, in any form, should be avoided completely. Smoking will cause low birth rate and premature babies.   Street or illegal drugs are very harmful to the baby. They are absolutely forbidden. A baby born to an addicted mother will be addicted at birth. The baby will go through the same withdrawal an adult does.  SEEK MEDICAL CARE IF:  You have any concerns or worries during your pregnancy. It is better to call with  your questions if you feel they cannot wait, rather than worry about them. SEEK IMMEDIATE MEDICAL CARE IF:   An unexplained oral temperature above 102 F (38.9 C) develops, or as your caregiver suggests.   You have leaking of fluid from the vagina (birth canal). If leaking membranes are suspected, take your temperature and tell your caregiver of this when you call.   There   is vaginal spotting, bleeding, or passing clots. Tell your caregiver of the amount and how many pads are used. Light spotting in pregnancy is common, especially following intercourse.   You develop a bad smelling vaginal discharge with a change in the color from clear to white.   You continue to feel sick to your stomach (nauseated) and have no relief from remedies suggested. You vomit blood or coffee ground-like materials.   You lose more than 2 pounds of weight or gain more than 2 pounds of weight over 1 week, or as suggested by your caregiver.   You notice swelling of your face, hands, feet, or legs.   You get exposed to German measles and have never had them.   You are exposed to fifth disease or chickenpox.   You develop belly (abdominal) pain. Round ligament discomfort is a common non-cancerous (benign) cause of abdominal pain in pregnancy. Your caregiver still must evaluate you.   You develop a bad headache that does not go away.   You develop fever, diarrhea, pain with urination, or shortness of breath.   You develop visual problems, blurry, or double vision.   You fall or are in a car accident or any kind of trauma.   There is mental or physical violence at home.  Document Released: 01/21/2001 Document Revised: 01/16/2011 Document Reviewed: 07/26/2008 [employer] Patient Information 2012 [employer]. Contraception Choices Contraception (birth control) is the use of any methods or devices to prevent pregnancy. Below are some methods to help avoid pregnancy. HORMONAL METHODS   Contraceptive implant.  This is a thin, plastic tube containing progesterone hormone. It does not contain estrogen hormone. Your caregiver inserts the tube in the inner part of the upper arm. The tube can remain in place for up to 3 years. After 3 years, the implant must be removed. The implant prevents the ovaries from releasing an egg (ovulation), thickens the cervical mucus which prevents sperm from entering the uterus, and thins the lining of the inside of the uterus.   Progesterone-only injections. These injections are given every 3 months by your caregiver to prevent pregnancy. This synthetic progesterone hormone stops the ovaries from releasing eggs. It also thickens cervical mucus and changes the uterine lining. This makes it harder for sperm to survive in the uterus.   Birth control pills. These pills contain estrogen and progesterone hormone. They work by stopping the egg from forming in the ovary (ovulation). Birth control pills are prescribed by a caregiver.Birth control pills can also be used to treat heavy periods.   Minipill. This type of birth control pill contains only the progesterone hormone. They are taken every day of each month and must be prescribed by your caregiver.   Birth control patch. The patch contains hormones similar to those in birth control pills. It must be changed once a week and is prescribed by a caregiver.   Vaginal ring. The ring contains hormones similar to those in birth control pills. It is left in the vagina for 3 weeks, removed for 1 week, and then a new one is put back in place. The patient must be comfortable inserting and removing the ring from the vagina.A caregiver's prescription is necessary.   Emergency contraception. Emergency contraceptives prevent pregnancy after unprotected sexual intercourse. This pill can be taken right after sex or up to 5 days after unprotected sex. It is most effective the sooner you take the pills after having sexual intercourse. Emergency  contraceptive pills are available without a prescription.   Check with your pharmacist. Do not use emergency contraception as your only form of birth control.  BARRIER METHODS   Female condom. This is a thin sheath (latex or rubber) that is worn over the penis during sexual intercourse. It can be used with spermicide to increase effectiveness.   Female condom. This is a soft, loose-fitting sheath that is put into the vagina before sexual intercourse.   Diaphragm. This is a soft, latex, dome-shaped barrier that must be fitted by a caregiver. It is inserted into the vagina, along with a spermicidal jelly. It is inserted before intercourse. The diaphragm should be left in the vagina for 6 to 8 hours after intercourse.   Cervical cap. This is a round, soft, latex or plastic cup that fits over the cervix and must be fitted by a caregiver. The cap can be left in place for up to 48 hours after intercourse.   Sponge. This is a soft, circular piece of polyurethane foam. The sponge has spermicide in it. It is inserted into the vagina after wetting it and before sexual intercourse.   Spermicides. These are chemicals that kill or block sperm from entering the cervix and uterus. They come in the form of creams, jellies, suppositories, foam, or tablets. They do not require a prescription. They are inserted into the vagina with an applicator before having sexual intercourse. The process must be repeated every time you have sexual intercourse.  INTRAUTERINE CONTRACEPTION  Intrauterine device (IUD). This is a T-shaped device that is put in a woman's uterus during a menstrual period to prevent pregnancy. There are 2 types:   Copper IUD. This type of IUD is wrapped in copper wire and is placed inside the uterus. Copper makes the uterus and fallopian tubes produce a fluid that kills sperm. It can stay in place for 10 years.   Hormone IUD. This type of IUD contains the hormone progestin (synthetic progesterone). The  hormone thickens the cervical mucus and prevents sperm from entering the uterus, and it also thins the uterine lining to prevent implantation of a fertilized egg. The hormone can weaken or kill the sperm that get into the uterus. It can stay in place for 5 years.  PERMANENT METHODS OF CONTRACEPTION  Female tubal ligation. This is when the woman's fallopian tubes are surgically sealed, tied, or blocked to prevent the egg from traveling to the uterus.   Female sterilization. This is when the female has the tubes that carry sperm tied off (vasectomy).This blocks sperm from entering the vagina during sexual intercourse. After the procedure, the man can still ejaculate fluid (semen).  NATURAL PLANNING METHODS  Natural family planning. This is not having sexual intercourse or using a barrier method (condom, diaphragm, cervical cap) on days the woman could become pregnant.   Calendar method. This is keeping track of the length of each menstrual cycle and identifying when you are fertile.   Ovulation method. This is avoiding sexual intercourse during ovulation.   Symptothermal method. This is avoiding sexual intercourse during ovulation, using a thermometer and ovulation symptoms.   Post-ovulation method. This is timing sexual intercourse after you have ovulated.  Regardless of which type or method of contraception you choose, it is important that you use condoms to protect against the transmission of sexually transmitted diseases (STDs). Talk with your caregiver about which form of contraception is most appropriate for you. Document Released: 01/27/2005 Document Revised: 01/16/2011 Document Reviewed: 06/05/2010 [employer] Patient Information 2012 [employer]. Breastfeeding BENEFITS OF BREASTFEEDING For   the baby  The first milk (colostrum) helps the baby's digestive system function better.   There are antibodies from the mother in the milk that help the baby fight off infections.   The baby has a  lower incidence of asthma, allergies, and SIDS (sudden infant death syndrome).   The nutrients in breast milk are better than formulas for the baby and helps the baby's brain grow better.   Babies who breastfeed have less gas, colic, and constipation.  For the mother  Breastfeeding helps develop a very special bond between mother and baby.   It is more convenient, always available at the correct temperature and cheaper than formula feeding.   It burns calories in the mother and helps with losing weight that was gained during pregnancy.   It makes the uterus contract back down to normal size faster and slows bleeding following delivery.   Breastfeeding mothers have a lower risk of developing breast cancer.  NURSE FREQUENTLY  A healthy, full-term baby may breastfeed as often as every hour or space his or her feedings to every 3 hours.   How often to nurse will vary from baby to baby. Watch your baby for signs of hunger, not the clock.   Nurse as often as the baby requests, or when you feel the need to reduce the fullness of your breasts.   Awaken the baby if it has been 3 to 4 hours since the last feeding.   Frequent feeding will help the mother make more milk and will prevent problems like sore nipples and engorgement of the breasts.  BABY'S POSITION AT THE BREAST  Whether lying down or sitting, be sure that the baby's tummy is facing your tummy.   Support the breast with 4 fingers underneath the breast and the thumb above. Make sure your fingers are well away from the nipple and baby's mouth.   Stroke the baby's lips and cheek closest to the breast gently with your finger or nipple.   When the baby's mouth is open wide enough, place all of your nipple and as much of the dark area around the nipple as possible into your baby's mouth.   Pull the baby in close so the tip of the nose and the baby's cheeks touch the breast during the feeding.  FEEDINGS  The length of each feeding  varies from baby to baby and from feeding to feeding.   The baby must suck about 2 to 3 minutes for your milk to get to him or her. This is called a "let down." For this reason, allow the baby to feed on each breast as long as he or she wants. Your baby will end the feeding when he or she has received the right balance of nutrients.   To break the suction, put your finger into the corner of the baby's mouth and slide it between his or her gums before removing your breast from his or her mouth. This will help prevent sore nipples.  REDUCING BREAST ENGORGEMENT  In the first week after your baby is born, you may experience signs of breast engorgement. When breasts are engorged, they feel heavy, warm, full, and may be tender to the touch. You can reduce engorgement if you:   Nurse frequently, every 2 to 3 hours. Mothers who breastfeed early and often have fewer problems with engorgement.   Place light ice packs on your breasts between feedings. This reduces swelling. Wrap the ice packs in a lightweight towel to protect   your skin.   Apply moist hot packs to your breast for 5 to 10 minutes before each feeding. This increases circulation and helps the milk flow.   Gently massage your breast before and during the feeding.   Make sure that the baby empties at least one breast at every feeding before switching sides.   Use a breast pump to empty the breasts if your baby is sleepy or not nursing well. You may also want to pump if you are returning to work or or you feel you are getting engorged.   Avoid bottle feeds, pacifiers or supplemental feedings of water or juice in place of breastfeeding.   Be sure the baby is latched on and positioned properly while breastfeeding.   Prevent fatigue, stress, and anemia.   Wear a supportive bra, avoiding underwire styles.   Eat a balanced diet with enough fluids.  If you follow these suggestions, your engorgement should improve in 24 to 48 hours. If you are  still experiencing difficulty, call your lactation consultant or caregiver. IS MY BABY GETTING ENOUGH MILK? Sometimes, mothers worry about whether their babies are getting enough milk. You can be assured that your baby is getting enough milk if:  The baby is actively sucking and you hear swallowing.   The baby nurses at least 8 to 12 times in a 24 hour time period. Nurse your baby until he or she unlatches or falls asleep at the first breast (at least 10 to 20 minutes), then offer the second side.   The baby is wetting 5 to 6 disposable diapers (6 to 8 cloth diapers) in a 24 hour period by 5 to 6 days of age.   The baby is having at least 2 to 3 stools every 24 hours for the first few months. Breast milk is all the food your baby needs. It is not necessary for your baby to have water or formula. In fact, to help your breasts make more milk, it is best not to give your baby supplemental feedings during the early weeks.   The stool should be soft and yellow.   The baby should gain 4 to 7 ounces per week after he is 4 days old.  TAKE CARE OF YOURSELF Take care of your breasts by:  Bathing or showering daily.   Avoiding the use of soaps on your nipples.   Start feedings on your left breast at one feeding and on your right breast at the next feeding.   You will notice an increase in your milk supply 2 to 5 days after delivery. You may feel some discomfort from engorgement, which makes your breasts very firm and often tender. Engorgement "peaks" out within 24 to 48 hours. In the meantime, apply warm moist towels to your breasts for 5 to 10 minutes before feeding. Gentle massage and expression of some milk before feeding will soften your breasts, making it easier for your baby to latch on. Wear a well fitting nursing bra and air dry your nipples for 10 to 15 minutes after each feeding.   Only use cotton bra pads.   Only use pure lanolin on your nipples after nursing. You do not need to wash it  off before nursing.  Take care of yourself by:   Eating well-balanced meals and nutritious snacks.   Drinking milk, fruit juice, and water to satisfy your thirst (about 8 glasses a day).   Getting plenty of rest.   Increasing calcium in your diet (1200 mg   a day).   Avoiding foods that you notice affect the baby in a bad way.  SEEK MEDICAL CARE IF:   You have any questions or difficulty with breastfeeding.   You need help.   You have a hard, red, sore area on your breast, accompanied by a fever of 100.5 F (38.1 C) or more.   Your baby is too sleepy to eat well or is having trouble sleeping.   Your baby is wetting less than 6 diapers per day, by 5 days of age.   Your baby's skin or white part of his or her eyes is more yellow than it was in the hospital.   You feel depressed.  Document Released: 01/27/2005 Document Revised: 01/16/2011 Document Reviewed: 09/11/2008 [employer] Patient Information 2012 [employer]. 

## 2011-05-12 NOTE — Progress Notes (Signed)
Needs one hour and quad screen U/S for growth at 24 weeks ordered

## 2011-05-12 NOTE — Progress Notes (Signed)
p=[phone] 

## 2011-05-13 LAB — GLUCOSE TOLERANCE, 1 HOUR: Glucose, 1 Hour GTT: 83 mg/dL (ref 70–140)

## 2011-05-14 LAB — ALPHA FETOPROTEIN, MATERNAL
AFP: 148 IU/mL
Curr Gest Age: 21.2 wks.days
MoM for AFP: 3.5
Open Spina bifida: NEGATIVE
Osb Risk: 1:442 {titer}

## 2011-06-02 ENCOUNTER — Ambulatory Visit (HOSPITAL_COMMUNITY): Payer: BC Managed Care – PPO

## 2011-06-02 ENCOUNTER — Ambulatory Visit (INDEPENDENT_AMBULATORY_CARE_PROVIDER_SITE_OTHER): Payer: BC Managed Care – PPO | Admitting: Obstetrics & Gynecology

## 2011-06-02 VITALS — BP 137/102 | Wt 255.0 lb

## 2011-06-02 DIAGNOSIS — I10 Essential (primary) hypertension: Secondary | ICD-10-CM

## 2011-06-02 DIAGNOSIS — O30009 Twin pregnancy, unspecified number of placenta and unspecified number of amniotic sacs, unspecified trimester: Secondary | ICD-10-CM

## 2011-06-02 DIAGNOSIS — O10019 Pre-existing essential hypertension complicating pregnancy, unspecified trimester: Secondary | ICD-10-CM

## 2011-06-02 DIAGNOSIS — Z3480 Encounter for supervision of other normal pregnancy, unspecified trimester: Secondary | ICD-10-CM

## 2011-06-02 DIAGNOSIS — O30049 Twin pregnancy, dichorionic/diamniotic, unspecified trimester: Secondary | ICD-10-CM

## 2011-06-02 NOTE — Progress Notes (Signed)
On review of her past notes, patient has a DBP of 90 at 11 weeks.  Over the past three visits, all DBP >90.  Today, BP 137/102, no proteinuria.  Patient denies any HA, visual changes, RUQ pain.  Will collect 24 hour urine, check BP labs when the urine is returned.  Preeclampsia precautions reviewed.  Growth scan scheduled within the week.  No other complaints or concerns.  Fetal movement and labor precautions also reviewed.

## 2011-06-02 NOTE — Progress Notes (Signed)
Patient is here for routine prenatal she is feeling good.  Babies both seen on u/s moving around.

## 2011-06-02 NOTE — Patient Instructions (Addendum)
Hypertension During Pregnancy Hypertension is also called high blood pressure. It can occur at any time in life and during pregnancy. When you have hypertension, there is extra pressure inside your blood vessels that carry blood from the heart to the rest of your body (arteries). Hypertension during pregnancy can cause problems for you and your baby. Your baby might not weigh as much as it should at birth or might be born early (premature). Very bad cases of hypertension during pregnancy can be life-threatening.  There are different types of hypertension during pregnancy.   Chronic hypertension. This happens when a woman has hypertension before pregnancy and it continues during pregnancy.   Gestational hypertension. This is when hypertension develops during pregnancy.   Preeclampsia or toxemia of pregnancy. This is a very serious type of hypertension that develops only during pregnancy. It is a disease that affects the whole body (systemic) and can be very dangerous for both mother and baby.   Gestational hypertension and preeclampsia usually go away after your baby is born. Blood pressure generally stabilizes within 6 weeks. Women who have hypertension during pregnancy have a greater chance of developing hypertension later in life or with future pregnancies. UNDERSTANDING BLOOD PRESSURE Blood pressure moves blood in your body. Sometimes, the force that moves the blood becomes too strong.  A blood pressure reading is given in 2 numbers and looks like a fraction.   The top number is called the systolic pressure. When your heart beats, it forces more blood to flow through the arteries. Pressure inside the arteries goes up.   The bottom number is the diastolic pressure. Pressure goes down between beats. That is when the heart is resting.   You may have hypertension if:   Your systolic blood pressure is above 140.   Your diastolic pressure is above 90.  RISK FACTORS Some factors make you more  likely to develop hypertension during pregnancy. Risk factors include:  Having hypertension before pregnancy.   Having hypertension during a previous pregnancy.   Being overweight.   Being older than 40.   Being pregnant with more than 1 baby (multiples).   Having diabetes or kidney problems.  SYMPTOMS Chronic and gestational hypertension may not cause symptoms. Preeclampsia has symptoms, which may include:  Increased protein in your urine. Your caregiver will check for this at every prenatal visit.   Swelling of your hands and face.   Rapid weight gain.   Headaches.   Visual changes.   Being bothered by light.   Abdominal pain, especially in the right upper area.   Chest pain.   Shortness of breath.   Increased reflexes.   Seizures. Seizures occur with a more severe form of preeclampsia, called eclampsia.  DIAGNOSIS   You may be diagnosed with hypertension during pregnancy during a regular prenatal exam. At each visit, tests may include:   Blood pressure checks.   A urine test to check for protein in your urine.   The type of hypertension you are diagnosed with depends on when you developed it. It also depends on your specific blood pressure reading.   Developing hypertension before 20 weeks of pregnancy is consistent with chronic hypertension.   Developing hypertension after 20 weeks of pregnancy is consistent with gestational hypertension.   Hypertension with increased urinary protein is diagnosed as preeclampsia.   Blood pressure measurements that stay above 160 systolic or 110 diastolic are a sign of severe preeclampsia.  TREATMENT Treatment for hypertension during pregnancy varies. Treatment depends on   the type of hypertension and how serious it is.  If you take medicine for chronic hypertension, you may need to switch medicines.   Drugs called ACE inhibitors should not be taken during pregnancy.   Low-dose aspirin may be suggested for women who have  risk factors for preeclampsia.   If you have gestational hypertension, you may need to take a blood pressure medicine that is safe during pregnancy. Your caregiver will recommend the appropriate medicine.   If you have severe preeclampsia, you may need to be in the hospital. Caregivers will watch you and the baby very closely. You also may need to take medicine (magnesium sulfate) to prevent seizures and lower blood pressure.   Sometimes an early delivery is needed. This may be the case if the condition worsens. It would be done to protect you and the baby. The only cure for preeclampsia is delivery.  HOME CARE INSTRUCTIONS  Schedule and keep all of your regular prenatal care.   Follow your caregiver's instructions for taking medicines. Tell your caregiver about all medicines you take. This includes over-the-counter medicines.   Eat as little salt as possible.   Get regular exercise.   Do not drink alcohol.   Do not use tobacco products.   Do not drink products with caffeine.   Lie on your left side when resting.   Tell your doctor if you have any preeclampsia symptoms.  SEEK IMMEDIATE MEDICAL CARE IF:  You have severe abdominal pain.   You have sudden swelling in the hands, ankles, or face.   You gain 4 pounds (1.8 kg) or more in 1 week.   You vomit repeatedly.   You have vaginal bleeding.   You do not feel the baby moving as much.   You have a headache.   You have blurred or double vision.   You have muscle twitching or spasms.   You have shortness of breath.   You have blue fingernails and lips.   You have blood in your urine.  MAKE SURE YOU:  Understand these instructions.   Will watch your condition.   Will get help right away if you are not doing well.  Document Released: 10/15/2010 Document Revised: 01/16/2011 Document Reviewed: 10/15/2010 [facility] Patient Information 2012 [place], [place].   Breastfeeding BENEFITS OF BREASTFEEDING For the  baby  The first milk (colostrum) helps the baby's digestive system function better.   There are antibodies from the mother in the milk that help the baby fight off infections.   The baby has a lower incidence of asthma, allergies, and SIDS (sudden infant death syndrome).   The nutrients in breast milk are better than formulas for the baby and helps the baby's brain grow better.   Babies who breastfeed have less gas, colic, and constipation.  For the mother  Breastfeeding helps develop a very special bond between mother and baby.   It is more convenient, always available at the correct temperature and cheaper than formula feeding.   It burns calories in the mother and helps with losing weight that was gained during pregnancy.   It makes the uterus contract back down to normal size faster and slows bleeding following delivery.   Breastfeeding mothers have a lower risk of developing breast cancer.  NURSE FREQUENTLY  A healthy, full-term baby may breastfeed as often as every hour or space his or her feedings to every 3 hours.   How often to nurse will vary from baby to baby. Watch your baby for  signs of hunger, not the clock.   Nurse as often as the baby requests, or when you feel the need to reduce the fullness of your breasts.   Awaken the baby if it has been 3 to 4 hours since the last feeding.   Frequent feeding will help the mother make more milk and will prevent problems like sore nipples and engorgement of the breasts.  BABY'S POSITION AT THE BREAST  Whether lying down or sitting, be sure that the baby's tummy is facing your tummy.   Support the breast with 4 fingers underneath the breast and the thumb above. Make sure your fingers are well away from the nipple and baby's mouth.   Stroke the baby's lips and cheek closest to the breast gently with your finger or nipple.   When the baby's mouth is open wide enough, place all of your nipple and as much of the dark area around  the nipple as possible into your baby's mouth.   Pull the baby in close so the tip of the nose and the baby's cheeks touch the breast during the feeding.  FEEDINGS  The length of each feeding varies from baby to baby and from feeding to feeding.   The baby must suck about 2 to 3 minutes for your milk to get to him or her. This is called a "let down." For this reason, allow the baby to feed on each breast as long as he or she wants. Your baby will end the feeding when he or she has received the right balance of nutrients.   To break the suction, put your finger into the corner of the baby's mouth and slide it between his or her gums before removing your breast from his or her mouth. This will help prevent sore nipples.  REDUCING BREAST ENGORGEMENT  In the first week after your baby is born, you may experience signs of breast engorgement. When breasts are engorged, they feel heavy, warm, full, and may be tender to the touch. You can reduce engorgement if you:   Nurse frequently, every 2 to 3 hours. Mothers who breastfeed early and often have fewer problems with engorgement.   Place light ice packs on your breasts between feedings. This reduces swelling. Wrap the ice packs in a lightweight towel to protect your skin.   Apply moist hot packs to your breast for 5 to 10 minutes before each feeding. This increases circulation and helps the milk flow.   Gently massage your breast before and during the feeding.   Make sure that the baby empties at least one breast at every feeding before switching sides.   Use a breast pump to empty the breasts if your baby is sleepy or not nursing well. You may also want to pump if you are returning to work or or you feel you are getting engorged.   Avoid bottle feeds, pacifiers or supplemental feedings of water or juice in place of breastfeeding.   Be sure the baby is latched on and positioned properly while breastfeeding.   Prevent fatigue, stress, and  anemia.   Wear a supportive bra, avoiding underwire styles.   Eat a balanced diet with enough fluids.  If you follow these suggestions, your engorgement should improve in 24 to 48 hours. If you are still experiencing difficulty, call your lactation consultant or caregiver. IS MY BABY GETTING ENOUGH MILK? Sometimes, mothers worry about whether their babies are getting enough milk. You can be assured that your baby is  getting enough milk if:  The baby is actively sucking and you hear swallowing.   The baby nurses at least 8 to 12 times in a 24 hour time period. Nurse your baby until he or she unlatches or falls asleep at the first breast (at least 10 to 20 minutes), then offer the second side.   The baby is wetting 5 to 6 disposable diapers (6 to 8 cloth diapers) in a 24 hour period by 58 to 68 days of age.   The baby is having at least 2 to 3 stools every 24 hours for the first few months. Breast milk is all the food your baby needs. It is not necessary for your baby to have water or formula. In fact, to help your breasts make more milk, it is best not to give your baby supplemental feedings during the early weeks.   The stool should be soft and yellow.   The baby should gain 4 to 7 ounces per week after he is 79 days old.  TAKE CARE OF YOURSELF Take care of your breasts by:  Bathing or showering daily.   Avoiding the use of soaps on your nipples.   Start feedings on your left breast at one feeding and on your right breast at the next feeding.   You will notice an increase in your milk supply 2 to 5 days after delivery. You may feel some discomfort from engorgement, which makes your breasts very firm and often tender. Engorgement "peaks" out within 24 to 48 hours. In the meantime, apply warm moist towels to your breasts for 5 to 10 minutes before feeding. Gentle massage and expression of some milk before feeding will soften your breasts, making it easier for your baby to latch on. Wear a  well fitting nursing bra and air dry your nipples for 10 to 15 minutes after each feeding.   Only use cotton bra pads.   Only use pure lanolin on your nipples after nursing. You do not need to wash it off before nursing.  Take care of yourself by:   Eating well-balanced meals and nutritious snacks.   Drinking milk, fruit juice, and water to satisfy your thirst (about 8 glasses a day).   Getting plenty of rest.   Increasing calcium in your diet (1200 mg a day).   Avoiding foods that you notice affect the baby in a bad way.  SEEK MEDICAL CARE IF:   You have any questions or difficulty with breastfeeding.   You need help.   You have a hard, red, sore area on your breast, accompanied by a fever of 100.5 F (38.1 C) or more.   Your baby is too sleepy to eat well or is having trouble sleeping.   Your baby is wetting less than 6 diapers per day, by 67 days of age.   Your baby's skin or white part of his or her eyes is more yellow than it was in the hospital.   You feel depressed.  Document Released: 01/27/2005 Document Revised: 01/16/2011 Document Reviewed: 09/11/2008 [facility] Patient Information 2012 [place], [place].

## 2011-06-09 ENCOUNTER — Other Ambulatory Visit: Payer: BC Managed Care – PPO | Admitting: *Deleted

## 2011-06-09 ENCOUNTER — Inpatient Hospital Stay (HOSPITAL_COMMUNITY): Payer: BC Managed Care – PPO

## 2011-06-09 ENCOUNTER — Encounter: Payer: Self-pay | Admitting: Obstetrics & Gynecology

## 2011-06-09 ENCOUNTER — Encounter (HOSPITAL_COMMUNITY): Payer: Self-pay | Admitting: *Deleted

## 2011-06-09 ENCOUNTER — Ambulatory Visit (HOSPITAL_COMMUNITY)
Admission: RE | Admit: 2011-06-09 | Discharge: 2011-06-09 | Disposition: A | Discharge status: Home / Self Care | Payer: BC Managed Care – PPO | Source: Ambulatory Visit | Attending: Family Medicine | Admitting: Family Medicine

## 2011-06-09 ENCOUNTER — Inpatient Hospital Stay (HOSPITAL_COMMUNITY)
Admission: AD | Admit: 2011-06-09 | Discharge: 2011-06-13 | DRG: 886 | Disposition: A | Discharge status: Home / Self Care | Payer: BC Managed Care – PPO | Source: Ambulatory Visit | Attending: Obstetrics & Gynecology | Admitting: Obstetrics & Gynecology

## 2011-06-09 ENCOUNTER — Other Ambulatory Visit: Payer: Self-pay | Admitting: Family Medicine

## 2011-06-09 DIAGNOSIS — O26879 Cervical shortening, unspecified trimester: Principal | ICD-10-CM

## 2011-06-09 DIAGNOSIS — O30049 Twin pregnancy, dichorionic/diamniotic, unspecified trimester: Secondary | ICD-10-CM

## 2011-06-09 DIAGNOSIS — Z3689 Encounter for other specified antenatal screening: Secondary | ICD-10-CM | POA: Insufficient documentation

## 2011-06-09 DIAGNOSIS — O30009 Twin pregnancy, unspecified number of placenta and unspecified number of amniotic sacs, unspecified trimester: Secondary | ICD-10-CM | POA: Diagnosis present

## 2011-06-09 DIAGNOSIS — O365990 Maternal care for other known or suspected poor fetal growth, unspecified trimester, not applicable or unspecified: Secondary | ICD-10-CM

## 2011-06-09 DIAGNOSIS — O10019 Pre-existing essential hypertension complicating pregnancy, unspecified trimester: Secondary | ICD-10-CM

## 2011-06-09 DIAGNOSIS — O10919 Unspecified pre-existing hypertension complicating pregnancy, unspecified trimester: Secondary | ICD-10-CM

## 2011-06-09 DIAGNOSIS — IMO0002 Reserved for concepts with insufficient information to code with codable children: Secondary | ICD-10-CM

## 2011-06-09 DIAGNOSIS — E282 Polycystic ovarian syndrome: Secondary | ICD-10-CM

## 2011-06-09 DIAGNOSIS — I10 Essential (primary) hypertension: Secondary | ICD-10-CM

## 2011-06-09 DIAGNOSIS — O34599 Maternal care for other abnormalities of gravid uterus, unspecified trimester: Secondary | ICD-10-CM | POA: Diagnosis present

## 2011-06-09 HISTORY — DX: Female infertility of tubal origin: N97.1

## 2011-06-09 HISTORY — DX: Polycystic ovarian syndrome: E28.2

## 2011-06-09 LAB — URINALYSIS, ROUTINE W REFLEX MICROSCOPIC
Bilirubin Urine: NEGATIVE
Glucose, UA: NEGATIVE mg/dL
Hgb urine dipstick: NEGATIVE
Ketones, ur: NEGATIVE mg/dL
Leukocytes, UA: NEGATIVE
Nitrite: NEGATIVE
Protein, ur: NEGATIVE mg/dL
Specific Gravity, Urine: 1.02 (ref 1.005–1.030)
Urobilinogen, UA: 0.2 mg/dL (ref 0.0–1.0)
pH: 6 (ref 5.0–8.0)

## 2011-06-09 LAB — CBC
HCT: 35.2 % — ABNORMAL LOW (ref 36.0–46.0)
Hemoglobin: 11 g/dL — ABNORMAL LOW (ref 12.0–15.0)
MCH: 24 pg — ABNORMAL LOW (ref 26.0–34.0)
MCHC: 31.3 g/dL (ref 30.0–36.0)
MCV: 76.7 fL — ABNORMAL LOW (ref 78.0–100.0)
Platelets: 238 10*3/uL (ref 150–400)
RBC: 4.59 MIL/uL (ref 3.87–5.11)
RDW: 18 % — ABNORMAL HIGH (ref 11.5–15.5)
WBC: 10.4 10*3/uL (ref 4.0–10.5)

## 2011-06-09 LAB — COMPREHENSIVE METABOLIC PANEL
ALT: 15 U/L (ref 0–35)
AST: 17 U/L (ref 0–37)
Albumin: 2.8 g/dL — ABNORMAL LOW (ref 3.5–5.2)
Alkaline Phosphatase: 68 U/L (ref 39–117)
BUN: 9 mg/dL (ref 6–23)
CO2: 21 mEq/L (ref 19–32)
Calcium: 8.9 mg/dL (ref 8.4–10.5)
Chloride: 103 mEq/L (ref 96–112)
Creatinine, Ser: 0.54 mg/dL (ref 0.50–1.10)
GFR calc Af Amer: >90 mL/min (ref >90)
GFR calc non Af Amer: >90 mL/min (ref >90)
Glucose, Bld: 69 mg/dL — ABNORMAL LOW (ref 70–99)
Potassium: 3.9 mEq/L (ref 3.5–5.1)
Sodium: 135 mEq/L (ref 135–145)
Total Bilirubin: 0.2 mg/dL — ABNORMAL LOW (ref 0.3–1.2)
Total Protein: 6.3 g/dL (ref 6.0–8.3)

## 2011-06-09 LAB — FERRITIN: Ferritin: 7 ng/mL — ABNORMAL LOW (ref 10–291)

## 2011-06-09 LAB — VITAMIN B12: Vitamin B-12: 277 pg/mL (ref 211–911)

## 2011-06-09 LAB — PROTEIN / CREATININE RATIO, URINE
Creatinine, Urine: 118.38 mg/dL
Protein Creatinine Ratio: 0.07 (ref 0.00–0.15)
Total Protein, Urine: 8.5 mg/dL

## 2011-06-09 LAB — OB RESULTS CONSOLE GBS: GBS: POSITIVE

## 2011-06-09 LAB — FOLATE: Folate: >20 ng/mL

## 2011-06-09 MED ORDER — DOCUSATE SODIUM 100 MG PO CAPS
100.0000 mg | ORAL_CAPSULE | Freq: Every day | ORAL | Status: DC
  Administered 2011-06-10 – 2011-06-13 (×4): 100 mg via ORAL
  Filled 2011-06-09 (×6): qty 1

## 2011-06-09 MED ORDER — ZOLPIDEM TARTRATE 10 MG PO TABS: 10.0000 mg | ORAL_TABLET | Freq: Every evening | ORAL | Status: DC | PRN

## 2011-06-09 MED ORDER — CALCIUM CARBONATE ANTACID 500 MG PO CHEW
2.0000 | CHEWABLE_TABLET | ORAL | Status: DC | PRN
  Filled 2011-06-09: qty 2

## 2011-06-09 MED ORDER — ACETAMINOPHEN 325 MG PO TABS
650.0000 mg | ORAL_TABLET | ORAL | Status: DC | PRN
  Administered 2011-06-10 – 2011-06-11 (×3): 650 mg via ORAL
  Filled 2011-06-09 (×3): qty 2

## 2011-06-09 MED ORDER — PRENATAL MULTIVITAMIN CH
1.0000 | ORAL_TABLET | Freq: Every day | ORAL | Status: DC
  Administered 2011-06-10 – 2011-06-13 (×3): 1 via ORAL
  Filled 2011-06-09 (×5): qty 1

## 2011-06-09 MED ORDER — BETAMETHASONE SOD PHOS & ACET 6 (3-3) MG/ML IJ SUSP
12.0000 mg | INTRAMUSCULAR | Status: AC
  Administered 2011-06-09 – 2011-06-10 (×2): 12 mg via INTRAMUSCULAR
  Filled 2011-06-09 (×2): qty 2

## 2011-06-09 MED ORDER — SODIUM CHLORIDE 0.9 % IV SOLN
INTRAVENOUS | Status: DC
  Administered 2011-06-09: 17:00:00 via INTRAVENOUS

## 2011-06-09 MED ORDER — PROGESTERONE MICRONIZED 200 MG PO CAPS
200.0000 mg | ORAL_CAPSULE | Freq: Every day | ORAL | Status: DC
  Filled 2011-06-09: qty 1

## 2011-06-09 NOTE — Progress Notes (Signed)
Patient dropped off 24 hour urine collection on her way to the hospital so they didn't have to carry it with them.  When she went to the hospital she was admitted due an abnormality on her ultrasound.

## 2011-06-09 NOTE — Plan of Care (Signed)
Problem: Consults Goal: Birthing Suites Patient Information Press F2 to bring up selections list   Pt < [redacted] weeks EGA     

## 2011-06-09 NOTE — H&P (Signed)
LENORE MOYANO is a 31 y.o. female G1P0 at 25 weeks 2 days with di/di TUIP presenting for Baby B small and subjectively low fluid. History OB History    Grav Para Term Preterm Abortions TAB SAB Ect Mult Living   1         0     Past Medical History  Diagnosis Date  . Headache     migraines  . 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 Polycystic ovarian syndrome   . Infertility of tubal origin    Past Surgical History  Procedure Date  . Diagnostic laparoscopy 07/24/2010    REMOVAL OF BILATERAL TUBES BLOCKAGE  . Gastric bypass 2004   Family History: family history includes Arthritis in her maternal grandmother and mother; Asthma in her mother; Diabetes in her father and mother; Fibromyalgia in her mother; and Hypertension in her father and maternal grandmother. Social History:  reports that she has never smoked. She has never used smokeless tobacco. She reports that she drinks alcohol. She reports that she does not use illicit drugs.  Review of Systems  Constitutional: Negative.   Respiratory: Negative.   Cardiovascular: Negative.   Gastrointestinal: Negative.   Genitourinary: Negative.   Neurological: Negative.     Dilation: 1 Effacement (%): Thick Station: -2 Exam by:: Dr. Penne Lash Blood pressure 116/84, pulse 82, temperature 99 F (37.2 C), temperature source Oral, resp. rate 18, height 5\' 6"  (1.676 m), weight 115.667 kg (255 lb), last menstrual period 12/14/2010. Exam Physical Exam  Vitals reviewed. Constitutional: She is oriented to person, place, and time. She appears well-developed and well-nourished.  HENT:  Head: Normocephalic and atraumatic.  Mouth/Throat: No oropharyngeal exudate.  Eyes: Conjunctivae are normal.  Neck: Neck supple.  Cardiovascular: Normal rate and regular rhythm.   Respiratory: Breath sounds normal.  GI: Soft. She exhibits no distension. There is no tenderness. There is no rebound and no guarding.  Genitourinary: Vagina  normal.       Cervix 1/30/high/soft  Neurological: She is alert and oriented to person, place, and time.  Skin: Skin is warm and dry.  Psychiatric: She has a normal mood and affect.    Prenatal labs: ABO, Rh: O/POS/-- (01/07 0932) Antibody: NEG (01/07 0932) Rubella: 193.7 (01/07 0932) RPR: NON REAC (01/07 0932)  HBsAg: NEGATIVE (01/07 0932)  HIV: NON REACTIVE (01/07 0932)  GBS:     Assessment/Plan: MARINELL IGARASHI is a 31 y.o. female G1P0 at 25 weeks 2 days with di/di TUIP presenting for Baby B small and subjectively low fluid.  Pt has also had some elevated pressures x1 week.  1-Follow up 24 hour urine turned into office today. 2-PIH labs, protein/creat ratio 3-Betamethasone 4-Magnesium for CP prophylaxis 5-Progesterone not indicated in TIUP (discussed with MFM) 6-Umbilical artery doppler 7-Labs for hx of gastric bypass--folic acid, CA, Ferritin, Vit B12, Vitamin D 8-GBS 9-FFN tomorrow at 4 pm    Ledger Heindl H. 06/09/2011, 3:47 PM

## 2011-06-10 ENCOUNTER — Inpatient Hospital Stay (HOSPITAL_COMMUNITY): Payer: BC Managed Care – PPO

## 2011-06-10 LAB — GC/CHLAMYDIA PROBE AMP, URINE
Chlamydia, Swab/Urine, PCR: NEGATIVE
GC Probe Amp, Urine: NEGATIVE

## 2011-06-10 LAB — VITAMIN D 25 HYDROXY (VIT D DEFICIENCY, FRACTURES): Vit D, 25-Hydroxy: 30 ng/mL (ref 30–89)

## 2011-06-10 LAB — CREATININE CLEARANCE, URINE, 24 HOUR
Creatinine Clearance: 215 mL/min — ABNORMAL HIGH (ref 75–115)
Creatinine, 24H Ur: 1670 mg/d (ref 700–1800)
Creatinine, Urine: 133.6 mg/dL
Creatinine: 0.54 mg/dL (ref 0.50–1.10)

## 2011-06-10 LAB — PROTEIN, URINE, 24 HOUR
Protein, 24H Urine: 100 mg/d (ref 50–100)
Protein, Urine: 8 mg/dL

## 2011-06-10 MED ORDER — FERROUS SULFATE 325 (65 FE) MG PO TABS
325.0000 mg | ORAL_TABLET | Freq: Every day | ORAL | Status: DC
  Administered 2011-06-10 – 2011-06-13 (×4): 325 mg via ORAL
  Filled 2011-06-10 (×4): qty 1

## 2011-06-10 MED ORDER — PRENATAL MULTIVITAMIN CH
1.0000 | ORAL_TABLET | Freq: Every day | ORAL | Status: DC
  Administered 2011-06-12: 1 via ORAL
  Filled 2011-06-10 (×6): qty 1

## 2011-06-10 MED ORDER — FOLIC ACID 1 MG PO TABS
1.0000 mg | ORAL_TABLET | Freq: Every day | ORAL | Status: DC
  Administered 2011-06-10 – 2011-06-13 (×4): 1 mg via ORAL
  Filled 2011-06-10 (×5): qty 1

## 2011-06-10 MED ORDER — METFORMIN HCL 500 MG PO TABS
1000.0000 mg | ORAL_TABLET | Freq: Two times a day (BID) | ORAL | Status: DC
  Administered 2011-06-10 – 2011-06-11 (×2): 1000 mg via ORAL
  Filled 2011-06-10 (×4): qty 2

## 2011-06-10 NOTE — Progress Notes (Signed)
Patient ID: Marilyn Wilkinson, female   DOB: [birth year], 31 y.o.   MRN: [id number]  FACULTY PRACTICE ANTEPARTUM(COMPREHENSIVE) NOTE  Marilyn Wilkinson is a 31 y.o. G1P0 at [redacted]w[redacted]d  who is admitted for developing IUGR of twin B, short cervix, subjectively low amniotic fluid twin B, r/o pre eclampsia.   Length of Stay:  1  Days  Subjective: Pt has headache.  Pt has history of migraines. Patient reports the fetal movement as limited, but nml for patient. Patient reports uterine contraction  activity as none. Patient reports  vaginal bleeding as none. Patient describes fluid per vagina as None.  Vitals:  Blood pressure 117/74, pulse 87, temperature 98.1 F (36.7 C), temperature source Oral, resp. rate 20, height 5\' 6"  (1.676 m), weight 115.667 kg (255 lb), last menstrual period 12/14/2010. Physical Examination:  General appearance - alert, well appearing, and in no distress Abdomen - soft, NT,  Extremities - Homan's sign negative bilaterally, nontender DTR - 2+  Fetal Monitoring:  Baseline: 150 (both fetuses) bpm, Variability: Good {> 6 bpm), Accelerations: Non-reactive but appropriate for gestational age and Decelerations: Absent  Labs:  Recent Results (from the past 24 hour(s))  URINALYSIS, ROUTINE W REFLEX MICROSCOPIC   Collection Time   06/09/11  3:00 PM      Component Value Range   Color, Urine YELLOW  YELLOW    APPearance CLEAR  CLEAR    Specific Gravity, Urine 1.020  1.005 - 1.030    pH 6.0  5.0 - 8.0    Glucose, UA NEGATIVE  NEGATIVE (mg/dL)   Hgb urine dipstick NEGATIVE  NEGATIVE    Bilirubin Urine NEGATIVE  NEGATIVE    Ketones, ur NEGATIVE  NEGATIVE (mg/dL)   Protein, ur NEGATIVE  NEGATIVE (mg/dL)   Urobilinogen, UA 0.2  0.0 - 1.0 (mg/dL)   Nitrite NEGATIVE  NEGATIVE    Leukocytes, UA NEGATIVE  NEGATIVE   PROTEIN / CREATININE RATIO, URINE   Collection Time   06/09/11  3:35 PM      Component Value Range   Creatinine, Urine 118.38     Total Protein, Urine 8.5     PROTEIN  CREATININE RATIO 0.07  0.00 - 0.15   CBC   Collection Time   06/09/11  3:48 PM      Component Value Range   WBC 10.4  4.0 - 10.5 (K/uL)   RBC 4.59  3.87 - 5.11 (MIL/uL)   Hemoglobin 11.0 (*) 12.0 - 15.0 (g/dL)   HCT 40.9 (*) 81.1 - 46.0 (%)   MCV 76.7 (*) 78.0 - 100.0 (fL)   MCH 24.0 (*) 26.0 - 34.0 (pg)   MCHC 31.3  30.0 - 36.0 (g/dL)   RDW 91.4 (*) 78.2 - 15.5 (%)   Platelets 238  150 - 400 (K/uL)  COMPREHENSIVE METABOLIC PANEL   Collection Time   06/09/11  3:48 PM      Component Value Range   Sodium 135  135 - 145 (mEq/L)   Potassium 3.9  3.5 - 5.1 (mEq/L)   Chloride 103  96 - 112 (mEq/L)   CO2 21  19 - 32 (mEq/L)   Glucose, Bld 69 (*) 70 - 99 (mg/dL)   BUN 9  6 - 23 (mg/dL)   Creatinine, Ser 9.56  0.50 - 1.10 (mg/dL)   Calcium 8.9  8.4 - 21.3 (mg/dL)   Total Protein 6.3  6.0 - 8.3 (g/dL)   Albumin 2.8 (*) 3.5 - 5.2 (g/dL)   AST 17  0 -  37 (U/L)   ALT 15  0 - 35 (U/L)   Alkaline Phosphatase 68  39 - 117 (U/L)   Total Bilirubin 0.2 (*) 0.3 - 1.2 (mg/dL)   GFR calc non Af Amer >90  >90 (mL/min)   GFR calc Af Amer >90  >90 (mL/min)  FERRITIN   Collection Time   06/09/11  3:48 PM      Component Value Range   Ferritin 7 (*) 10 - 291 (ng/mL)  VITAMIN B12   Collection Time   06/09/11  3:48 PM      Component Value Range   Vitamin B-12 277  211 - 911 (pg/mL)  VITAMIN D 25 HYDROXY   Collection Time   06/09/11  3:48 PM      Component Value Range   Vit D, 25-Hydroxy 30  30 - 89 (ng/mL)  FOLATE   Collection Time   06/09/11  3:48 PM      Component Value Range   Folate >20.0    PROTEIN, URINE, 24 HOUR   Collection Time   06/09/11  4:08 PM      Component Value Range   Protein, Urine 8     Protein, 24H Urine 100  50 - 100 (mg/day)  CREATININE CLEARANCE, URINE, 24 HOUR   Collection Time   06/09/11  4:08 PM      Component Value Range   Creatinine       Creatinine, Urine 133.6     Creatinine, 24H Ur 1670  700 - 1800 (mg/day)   Creatinine Clearance        Imaging Studies:     Umbilical artery, fetus B, absent end diastolic flow.  Medications:  Scheduled    . betamethasone acetate-betamethasone sodium phosphate  12 mg Intramuscular Q24H  . docusate sodium  100 mg Oral Daily  . prenatal multivitamin  1 tablet Oral Daily  . DISCONTD: progesterone  200 mg Vaginal QHS   I have reviewed the patient's current medications.  ASSESSMENT: Patient Active Problem List  Diagnoses  . PCOS (polycystic ovarian syndrome)  . Dichorionic diamniotic twin pregnancy  .  Chronic hypertension antepartum  . Chronic hypertension    PLAN: Marilyn Wilkinson is a 31 y.o. G1P0 at [redacted]w[redacted]d  who is admitted for developing IUGR of twin B, short cervix, subjectively low amniotic fluid twin B, r/o pre eclampsia.  1-MFM consult 2-Follow up on 24 hour urine 3-Iron for low ferritin 4-continue betamethasone 5-consider ffn at 2 pm  Teryl Gubler H. 06/10/2011,7:58 AM

## 2011-06-10 NOTE — Progress Notes (Signed)
Patient ID: Marilyn Wilkinson, female   DOB: 1980/08/15, 31 y.o.   MRN: [id number] FACULTY PRACTICE ANTEPARTUM(COMPREHENSIVE) NOTE  Marilyn Wilkinson is a 31 y.o. G1P0 at [redacted]w[redacted]d by LMP, date of conception, early ultrasound who is admitted for fetal assessment with abnormal US findings.    Length of Stay:  1  Days  Subjective: Normal fetal movement Patient reports the fetal movement as normal. Patient reports uterine contraction  activity as none. Patient reports  vaginal bleeding as none. Patient describes fluid per vagina as None.  Vitals:  Blood pressure 138/81, pulse 103, temperature 98.2 F (36.8 C), temperature source Oral, resp. rate 20, height 5\' 6"  (1.676 m), weight 115.667 kg (255 lb), last menstrual period 12/14/2010. Physical Examination:  General appearance - alert, well appearing, and in no distress Heart - normal rate and regular rhythm Abdomen - soft, nontender, nondistended Fundal Height:  size equals dates and consistent with twins Cervical Exam: Not evaluated. and found to be not evaluated/ / and fetal presentation is . Extremities: extremities normal, atraumatic, no cyanosis or edema and Homans sign is negative, no sign of DVT with DTRs 2+ bilaterally Membranes:intact  Fetal Monitoring:  Baseline: 150-160 bpm  Labs:  No results found for this or any previous visit (from the past 24 hour(s)).  Imaging Studies:     Currently EPIC will not allow sonographic studies to automatically populate into notes.  In the meantime, copy and paste results into note or free text.  Medications:  Scheduled    . betamethasone acetate-betamethasone sodium phosphate  12 mg Intramuscular Q24H  . docusate sodium  100 mg Oral Daily  . ferrous sulfate  325 mg Oral Q breakfast  . folic acid  1 mg Oral Daily  . metFORMIN  1,000 mg Oral BID WC  . prenatal multivitamin  1 tablet Oral Daily  . prenatal multivitamin  1 tablet Oral Daily   I have reviewed the patient's current  medications.  ASSESSMENT: Patient Active Problem List  Diagnoses  . PCOS (polycystic ovarian syndrome)  . Dichorionic diamniotic twin pregnancy  .  Chronic hypertension antepartum  . Chronic hypertension    PLAN: End diastolic flow noted on [place] today, low normal AF baby B. Monitor q shift, repeat BPP, dopplers and AFI in 2-3 days Discussed with Dr. Carlye Grippe 06/10/2011,7:34 PM

## 2011-06-10 NOTE — Progress Notes (Signed)
UR Chart review completed.  

## 2011-06-10 NOTE — Progress Notes (Signed)
Please see AS ultrasound report for recommendations

## 2011-06-11 ENCOUNTER — Encounter (HOSPITAL_COMMUNITY): Payer: Self-pay | Admitting: *Deleted

## 2011-06-11 DIAGNOSIS — O26879 Cervical shortening, unspecified trimester: Secondary | ICD-10-CM

## 2011-06-11 DIAGNOSIS — O30049 Twin pregnancy, dichorionic/diamniotic, unspecified trimester: Secondary | ICD-10-CM

## 2011-06-11 DIAGNOSIS — O10019 Pre-existing essential hypertension complicating pregnancy, unspecified trimester: Secondary | ICD-10-CM

## 2011-06-11 DIAGNOSIS — O30009 Twin pregnancy, unspecified number of placenta and unspecified number of amniotic sacs, unspecified trimester: Secondary | ICD-10-CM

## 2011-06-11 LAB — FETAL FIBRONECTIN: Fetal Fibronectin: POSITIVE — AB

## 2011-06-11 NOTE — Progress Notes (Signed)
06/11/11 1500  Clinical Encounter Type  Visited With Patient and family together (Husband Talmadge Chad)  Visit Type Initial;Spiritual support;Social support  Spiritual Encounters  Spiritual Needs Prayer;Emotional  Stress Factors  Family Stress Factors Major life changes (Concerns about babies' and mother's health)    Referred yesterday by nurse to visit today, following yesterday's scheduled MFM and NICU contacts.   Visited with Rashida, husband Luisa Hart, and [name]'s mother Dewayne Hatch to offer chaplain services.  Family was receptive to spiritual support and requested prayer.  We held hands at the bedside, and each person offered vocal prayer of gratitude, for healing, about hopes.  MIL Ann particularly asked how to reach the chaplain and requested follow-up support for [place] and family.  Family expressed gratitude for support, encouragement, and prayer.  Provided pastoral presence, listening, encouragement, and prayer at bedside.  Spiritual Care will continue to follow for emotional and spiritual support.  Avis Epley, [place] Chaplain [phone]

## 2011-06-11 NOTE — Progress Notes (Signed)
Pt sitting up eating breakfast, will do monitoring when finished.

## 2011-06-11 NOTE — Progress Notes (Signed)
Patient ID: Marilyn Wilkinson, female   DOB: 1980/07/26, 31 y.o.   MRN: [id number] FACULTY PRACTICE ANTEPARTUM(COMPREHENSIVE) NOTE  Marilyn Wilkinson is a 31 y.o. G1P0 [redacted]w[redacted]d by LMP who is admitted for monitoring for abnormal US findings.   Fetal presentation is . Length of Stay:  2  Days  Subjective: Rare contractions Patient reports the fetal movement as active. Patient reports uterine contraction  activity as none. Patient reports  vaginal bleeding as none. Patient describes fluid per vagina as None.  Vitals:  Blood pressure 118/70, pulse 79, temperature 98.1 F (36.7 C), temperature source Oral, resp. rate 20, height 5\' 6"  (1.676 m), weight 115.667 kg (255 lb), last menstrual period 12/14/2010. Physical Examination:  General appearance - alert, well appearing, and in no distress Heart - normal rate and regular rhythm Abdomen - soft, nontender, nondistended Fundal Height:  consistent with twins Cervical Exam: Not evaluated. and found to be not evaluated/ / and fetal presentation is . Extremities: extremities normal, atraumatic, no cyanosis or edema and Homans sign is negative, no sign of DVT with DTRs 2+ bilaterally Membranes:intact  Fetal Monitoring:  Baseline: 150-155 bpm, Variability: Good {> 6 bpm), Accelerations: Reactive and Decelerations: Variable: mild  Labs:  No results found for this or any previous visit (from the past 24 hour(s)).  Imaging Studies:     Currently EPIC will not allow sonographic studies to automatically populate into notes.  In the meantime, copy and paste results into note or free text. NOrmal cord dopplers 06/10/11 Medications:  Scheduled    . betamethasone acetate-betamethasone sodium phosphate  12 mg Intramuscular Q24H  . docusate sodium  100 mg Oral Daily  . ferrous sulfate  325 mg Oral Q breakfast  . folic acid  1 mg Oral Daily  . metFORMIN  1,000 mg Oral BID WC  . prenatal multivitamin  1 tablet Oral Daily  . prenatal multivitamin  1 tablet Oral  Daily   I have reviewed the patient's current medications.  ASSESSMENT: Patient Active Problem List  Diagnoses  . PCOS (polycystic ovarian syndrome)  . Dichorionic diamniotic twin pregnancy  .  Chronic hypertension antepartum  . Chronic hypertension    PLAN: Monitor intermittently, dopplers. Consider fFN today due to short cervix.  Karlisa Gaubert 06/11/2011,7:23 AM

## 2011-06-12 DIAGNOSIS — O30049 Twin pregnancy, dichorionic/diamniotic, unspecified trimester: Secondary | ICD-10-CM

## 2011-06-12 MED ORDER — METFORMIN HCL 500 MG PO TABS
500.0000 mg | ORAL_TABLET | Freq: Two times a day (BID) | ORAL | Status: DC
  Administered 2011-06-12 – 2011-06-13 (×4): 500 mg via ORAL
  Filled 2011-06-12 (×5): qty 1

## 2011-06-12 NOTE — Progress Notes (Signed)
Patient ID: Marilyn Wilkinson, female   DOB: 04/05/1980, 31 y.o.   MRN: [id number] FACULTY PRACTICE ANTEPARTUM(COMPREHENSIVE) NOTE  Marilyn Wilkinson is a 31 y.o. G1P0 at [redacted]w[redacted]d by LMP who is admitted for monitoring for abnormal US findings.    Length of Stay:  3  Days  Subjective: Rare contractions Patient reports the fetal movement as active. Patient reports uterine contraction  activity as none. Patient reports  vaginal bleeding as none. Patient describes fluid per vagina as None.  Vitals:  Blood pressure 124/73, pulse 69, temperature 98.1 F (36.7 C), temperature source Oral, resp. rate 18, height 5\' 6"  (1.676 m), weight 115.667 kg (255 lb), last menstrual period 12/14/2010. Physical Examination:  General appearance - alert, well appearing, and in no distress Heart - normal rate and regular rhythm Abdomen - soft, nontender, nondistended Fundal Height:  consistent with twins Cervical Exam: Not evaluated. and found to be not evaluated/ / and fetal presentation is . Extremities: extremities normal, atraumatic, no cyanosis or edema and Homans sign is negative, no sign of DVT with DTRs 2+ bilaterally Membranes:intact  Fetal Monitoring:  Baseline 155, moderate variability, no accels, no decels                                Baseline 165, moderate variability, no accels, no decels Toco: no ctx  Labs:  Recent Results (from the past 24 hour(s))  FETAL FIBRONECTIN   Collection Time   06/11/11  2:36 PM      Component Value Range   Fetal Fibronectin POSITIVE (*) NEGATIVE     Imaging Studies:    Normal cord dopplers Jul 04, 2011 Medications:  Scheduled    . docusate sodium  100 mg Oral Daily  . ferrous sulfate  325 mg Oral Q breakfast  . folic acid  1 mg Oral Daily  . metFORMIN  500 mg Oral BID WC  . prenatal multivitamin  1 tablet Oral Daily  . prenatal multivitamin  1 tablet Oral Daily  . DISCONTD: metFORMIN  1,000 mg Oral BID WC   I have reviewed the patient's current  medications.  ASSESSMENT: Patient Active Problem List  Diagnoses  . PCOS (polycystic ovarian syndrome)  . Dichorionic diamniotic twin pregnancy  .  Chronic hypertension antepartum  . Chronic hypertension    PLAN: Continue close monitoring Plan for repeat ultrasound tomorrow morning with MFM Continue current care  Marilyn Wilkinson 06/12/2011,11:40 AM

## 2011-06-12 NOTE — Consult Note (Signed)
[facility]  Neonatal Medicine Consultation       06/12/2011    4:22 PM  I was called at the request of the patient's obstetrician (Dr. Penne Lash) to speak to this patient due to 25 5/7 week twin pregnancy with poor growth of twin B.  The patient was admitted on 4/29, and has been treated with bethamethasone.    I discussed outcome expected for preterm babies born at 25-34 weeks.  I reviewed respiratory distress and possible treatments needed, infection, feeding, intracranial bleeding, retinopathy.  I encouraged her to breast feed the twins, even if she can only do it while they are in the NICU.  I encouraged the father to tour the nursery, and to discuss with their OB nurse to make arrangements.  _____________________ Electronically Signed By: Angelita Ingles, MD Neonatologist

## 2011-06-13 ENCOUNTER — Inpatient Hospital Stay (HOSPITAL_COMMUNITY): Payer: BC Managed Care – PPO

## 2011-06-13 LAB — CULTURE, BETA STREP (GROUP B ONLY)

## 2011-06-13 MED ORDER — PROGESTERONE MICRONIZED 200 MG PO CAPS: 200.0000 mg | ORAL_CAPSULE | Freq: Every day | ORAL | Status: DC

## 2011-06-13 NOTE — Discharge Summary (Signed)
Attestation of Attending Supervision of Advanced Practitioner: Evaluation and management procedures were performed by the OB Fellow/PA/CNM/NP under my supervision and collaboration. Chart reviewed, and agree with management and plan.  Michaila Kenney, M.D. 06/13/2011 9:32 PM   

## 2011-06-13 NOTE — Progress Notes (Signed)
Patient returns for follow up BPPs and umbilical artery Dopplers due to DC/DA twins, lagging growth on Twin B with 25% discordance.  See report in AS/OB-GYN.  Alpha Gula, MD

## 2011-06-13 NOTE — Progress Notes (Signed)
UR Chart review completed.  

## 2011-06-13 NOTE — Discharge Instructions (Signed)
Multiple Pregnancy A multiple pregnancy is when a woman is pregnant with two or more fetuses. Multiple pregnancies occur in about 3% of all births. The more babies in a pregnancy, the greater the risks of problems to the babies and mother. This includes death. Since the use of Assisted Reproductive Technology (ART) and medications that can induce ovulation, multiple fetal gestation has increased.  RISKS TO THE MOTHER  Preeclampsia and eclampsia.   Postpartum bleeding (hemorrhage).   Kidney infection (pyelonephritis).   Develop anemia.   Develop diabetes.   Liver complications.   A blood clot blocks the artery, or branch of the artery leading to the lungs (pulmonary embolism).   Blood clots in the leg.   Placental separation.   Higher rate of Cesarean Section deliveries.   Women over 31 years old have a higher rate of Downs Syndrome babies.  RISKS TO THE BABY  Preterm labor with a premature baby.   Very low birth weight babies that are less than 3 pounds, especially with triplets or mores.   Premature rupture of the membranes.   Twin to twin blood transfusion with one baby anemic and the other baby with too much blood in its system. There may also be heart failure.   With triplets or more, one of the babies is at high risk for cerebral palsy or other neurologic problem.   There is a higher incidence of fetal death.  CARE OF MOTHERS WITH MULTIPLE FETAL GESTATION Multiple pregnancies need more care and special prenatal care.  You will see your caregiver more often.   You will have more tests including ultrasounds, nonstress tests and blood tests.   You will have special tests done called amniocentesis and a biophysical profile.   You may be hospitalized more often during the pregnancy.   You will be encouraged to eat a balanced and healthy diet with vitamin and mineral supplements as directed.   You will be asked to get more rest and sleep to keep up your energy.    You will be asked to restrict your daily activities, exercise, work, household chores and sexual activity.   If you have preterm labor with small babies, you will be given a steroid injection to help the babies lungs mature and do better when born.   The delivery may have to be by Cesarean delivery, especially if there are triplets or more.   The delivery should be in a hospital with an intensive care nursery and Neonatologists (pediatrician for high risk babies) to care for the newborn babies.  HOME CARE INSTRUCTIONS   Follow the caregiver's recommendations regarding office visits, tests for you and the babies, diet, rest and medications.   Avoid a large amount of physical activity.   Arrange to have help after the babies are born and when you go home from the hospital.   Take classes on how to care for multiple babies before you deliver them.  SEEK IMMEDIATE MEDICAL CARE IF:   You develop a temperature of 102 F (38.9 C) or higher.   You are leaking fluid from the vagina.   You develop vaginal bleeding.   You develop uterine contractions.   You develop a severe headache, severe upper abdominal pain, visual problems or excessive swelling of your face, hands and feet.   You develop severe back pain or leg pain.   You develop severe tiredness.   You develop chest pain.   You have shortness of breath, fall down or pass out.  Document Released: 11/06/2007 Document Revised: 01/16/2011 Document Reviewed: 11/06/2007 [facility] Patient Information 2012 [place], [place].Multiple Pregnancy A multiple pregnancy is when a woman is pregnant with two or more fetuses. Multiple pregnancies occur in about 3% of all births. The more babies in a pregnancy, the greater the risks of problems to the babies and mother. This includes death. Since the use of Assisted Reproductive Technology (ART) and medications that can induce ovulation, multiple fetal gestation has increased.  RISKS TO THE  MOTHER  Preeclampsia and eclampsia.   Postpartum bleeding (hemorrhage).   Kidney infection (pyelonephritis).   Develop anemia.   Develop diabetes.   Liver complications.   A blood clot blocks the artery, or branch of the artery leading to the lungs (pulmonary embolism).   Blood clots in the leg.   Placental separation.   Higher rate of Cesarean Section deliveries.   Women over 31 years old have a higher rate of Downs Syndrome babies.  RISKS TO THE BABY  Preterm labor with a premature baby.   Very low birth weight babies that are less than 3 pounds, especially with triplets or mores.   Premature rupture of the membranes.   Twin to twin blood transfusion with one baby anemic and the other baby with too much blood in its system. There may also be heart failure.   With triplets or more, one of the babies is at high risk for cerebral palsy or other neurologic problem.   There is a higher incidence of fetal death.  CARE OF MOTHERS WITH MULTIPLE FETAL GESTATION Multiple pregnancies need more care and special prenatal care.  You will see your caregiver more often.   You will have more tests including ultrasounds, nonstress tests and blood tests.   You will have special tests done called amniocentesis and a biophysical profile.   You may be hospitalized more often during the pregnancy.   You will be encouraged to eat a balanced and healthy diet with vitamin and mineral supplements as directed.   You will be asked to get more rest and sleep to keep up your energy.   You will be asked to restrict your daily activities, exercise, work, household chores and sexual activity.   If you have preterm labor with small babies, you will be given a steroid injection to help the babies lungs mature and do better when born.   The delivery may have to be by Cesarean delivery, especially if there are triplets or more.   The delivery should be in a hospital with an intensive care  nursery and Neonatologists (pediatrician for high risk babies) to care for the newborn babies.  HOME CARE INSTRUCTIONS   Follow the caregiver's recommendations regarding office visits, tests for you and the babies, diet, rest and medications.   Avoid a large amount of physical activity.   Arrange to have help after the babies are born and when you go home from the hospital.   Take classes on how to care for multiple babies before you deliver them.  SEEK IMMEDIATE MEDICAL CARE IF:   You develop a temperature of 102 F (38.9 C) or higher.   You are leaking fluid from the vagina.   You develop vaginal bleeding.   You develop uterine contractions.   You develop a severe headache, severe upper abdominal pain, visual problems or excessive swelling of your face, hands and feet.   You develop severe back pain or leg pain.   You develop severe tiredness.   You develop  chest pain.   You have shortness of breath, fall down or pass out.  Document Released: 11/06/2007 Document Revised: 01/16/2011 Document Reviewed: 11/06/2007 [facility] Patient Information 2012 [place], [place].Preventing Preterm Labor Preterm labor is when a pregnant woman has contractions that cause the cervix to open, shorten, and thin before 37 weeks of pregnancy. You will have regular contractions (tightening) 2 to 3 minutes apart. This usually causes discomfort or pain. HOME CARE  Eat a healthy diet.   Take your vitamins as told by your doctor.   Drink enough fluids to keep your pee (urine) clear or pale yellow every day.   Get rest and sleep.   Do not have sex if you are at high risk for preterm labor.   Follow your doctor's advice about activity, medicines, and tests.   Avoid stress.   Avoid hard labor or exercise that lasts for a long time.   Do not smoke.  GET HELP RIGHT AWAY IF:   You are having contractions.   You have belly (abdominal) pain.   You have bleeding from your vagina.   You have  pain when you pee (urinate).   You have abnormal discharge from your vagina.   You have a temperature by mouth above 102 F (38.9 C).  MAKE SURE YOU:  Understand these instructions.   Will watch your condition.   Will get help if you are not doing well or get worse.  Document Released: 04/25/2008 Document Revised: 01/16/2011 Document Reviewed: 04/25/2008 [facility] Patient Information 2012 [place], [place].

## 2011-06-13 NOTE — Progress Notes (Signed)
Patient seen for repeat BPP on Twin A (BPP 6/8 earlier this AM for absent breating). The fetus is active- fetal breathing movement noted immediately.  Repeat BPP for Twin A only 8/8.  Recommend continued 2x weekly BPPs/ Doppler studies.  See report in AS - OB/GYN  Alpha Gula, MD

## 2011-06-13 NOTE — Discharge Summary (Signed)
  FACULTY PRACTICE ANTEPARTUM(COMPREHENSIVE) NOTE  Marilyn Wilkinson is a 31 y.o. G1P0000 at 83w6dwho is admitted for Di/Di twins, cervical shortening  Length of Stay:  4  Days  Subjective:  No complaints Patient reports the fetal movement as active. Patient reports uterine contraction  activity as none. Patient reports  vaginal bleeding as none. Patient describes fluid per vagina as None.  Vitals:  Blood pressure 152/90, pulse 102, temperature 97.8 F (36.6 C), temperature source Oral, resp. rate 18, height 5\' 6"  (1.676 m), weight 115.667 kg (255 lb), last menstrual period 12/14/2010. Physical Examination:  General appearance - alert, well appearing, and in no distress, oriented to person, place, and time and well hydrated Heart - normal rate and regular rhythmAbdomen - soft, nontender, nondistended Fundal Height:  consistent with twins Extremities: extremities normal, atraumatic, no cyanosis or edema and Homans sign is negative, no sign of DVT with DTRs 2+ bilaterally Membranes:intact  Fetal Monitoring:  Baseline: A: 155, B160 bpm  Labs: No results found for this or any previous visit (from the past 24 hour(s)). No results found for this or any previous visit (from the past 24 hour(s)). fFN + Imaging Studies:    Repeat BPP by MFM 8/8 on twin A  Medications:  Scheduled    . docusate sodium  100 mg Oral Daily  . ferrous sulfate  325 mg Oral Q breakfast  . folic acid  1 mg Oral Daily  . metFORMIN  500 mg Oral BID WC  . prenatal multivitamin  1 tablet Oral Daily   I have reviewed the patient's current medications. NICU consult done. S/P BMZ ASSESSMENT: PCOS DC/DA twins at 25wk6d Growth lag twin B 25% Short cx CHTN   PLAN: D/C home with F/U at [facility] and 2x/wk BPP Dopplers with MFM PRL precautions and pelvic rest Rx prometrium 200 mg q hs  Beck Cofer 06/13/2011,8:01 PM

## 2011-06-16 ENCOUNTER — Other Ambulatory Visit: Payer: Self-pay | Admitting: Obstetrics & Gynecology

## 2011-06-16 DIAGNOSIS — IMO0001 Reserved for inherently not codable concepts without codable children: Secondary | ICD-10-CM

## 2011-06-17 ENCOUNTER — Ambulatory Visit (INDEPENDENT_AMBULATORY_CARE_PROVIDER_SITE_OTHER): Payer: BC Managed Care – PPO | Admitting: Obstetrics & Gynecology

## 2011-06-17 ENCOUNTER — Ambulatory Visit (HOSPITAL_COMMUNITY): Payer: Self-pay

## 2011-06-17 ENCOUNTER — Inpatient Hospital Stay (HOSPITAL_COMMUNITY)
Admission: AD | Admit: 2011-06-17 | Discharge: 2011-06-29 | DRG: 651 | Disposition: A | Discharge status: Home / Self Care | Payer: BC Managed Care – PPO | Source: Ambulatory Visit | Attending: Obstetrics & Gynecology | Admitting: Obstetrics & Gynecology

## 2011-06-17 ENCOUNTER — Other Ambulatory Visit: Payer: Self-pay | Admitting: Obstetrics & Gynecology

## 2011-06-17 ENCOUNTER — Encounter (HOSPITAL_COMMUNITY): Payer: Self-pay | Admitting: *Deleted

## 2011-06-17 ENCOUNTER — Ambulatory Visit (HOSPITAL_COMMUNITY)
Admit: 2011-06-17 | Discharge: 2011-06-17 | Disposition: A | Discharge status: Home / Self Care | Payer: BC Managed Care – PPO | Attending: Obstetrics & Gynecology | Admitting: Obstetrics & Gynecology

## 2011-06-17 VITALS — BP 133/96

## 2011-06-17 VITALS — BP 145/116 | Wt 259.0 lb

## 2011-06-17 DIAGNOSIS — O99844 Bariatric surgery status complicating childbirth: Secondary | ICD-10-CM | POA: Diagnosis present

## 2011-06-17 DIAGNOSIS — O34599 Maternal care for other abnormalities of gravid uterus, unspecified trimester: Secondary | ICD-10-CM | POA: Diagnosis present

## 2011-06-17 DIAGNOSIS — IMO0001 Reserved for inherently not codable concepts without codable children: Secondary | ICD-10-CM

## 2011-06-17 DIAGNOSIS — I10 Essential (primary) hypertension: Secondary | ICD-10-CM

## 2011-06-17 DIAGNOSIS — O1002 Pre-existing essential hypertension complicating childbirth: Principal | ICD-10-CM | POA: Diagnosis present

## 2011-06-17 DIAGNOSIS — O30049 Twin pregnancy, dichorionic/diamniotic, unspecified trimester: Secondary | ICD-10-CM | POA: Diagnosis present

## 2011-06-17 DIAGNOSIS — E282 Polycystic ovarian syndrome: Secondary | ICD-10-CM | POA: Diagnosis present

## 2011-06-17 DIAGNOSIS — O10019 Pre-existing essential hypertension complicating pregnancy, unspecified trimester: Secondary | ICD-10-CM | POA: Diagnosis present

## 2011-06-17 DIAGNOSIS — O365990 Maternal care for other known or suspected poor fetal growth, unspecified trimester, not applicable or unspecified: Secondary | ICD-10-CM | POA: Diagnosis present

## 2011-06-17 DIAGNOSIS — O30009 Twin pregnancy, unspecified number of placenta and unspecified number of amniotic sacs, unspecified trimester: Secondary | ICD-10-CM | POA: Diagnosis present

## 2011-06-17 DIAGNOSIS — O43899 Other placental disorders, unspecified trimester: Secondary | ICD-10-CM | POA: Diagnosis not present

## 2011-06-17 DIAGNOSIS — O3090 Multiple gestation, unspecified, unspecified trimester: Secondary | ICD-10-CM | POA: Diagnosis present

## 2011-06-17 DIAGNOSIS — O368990 Maternal care for other specified fetal problems, unspecified trimester, not applicable or unspecified: Secondary | ICD-10-CM

## 2011-06-17 DIAGNOSIS — Z3480 Encounter for supervision of other normal pregnancy, unspecified trimester: Secondary | ICD-10-CM

## 2011-06-17 LAB — COMPREHENSIVE METABOLIC PANEL
ALT: 14 U/L (ref 0–35)
AST: 16 U/L (ref 0–37)
Albumin: 2.6 g/dL — ABNORMAL LOW (ref 3.5–5.2)
Alkaline Phosphatase: 72 U/L (ref 39–117)
BUN: 12 mg/dL (ref 6–23)
CO2: 23 mEq/L (ref 19–32)
Calcium: 8.8 mg/dL (ref 8.4–10.5)
Chloride: 102 mEq/L (ref 96–112)
Creatinine, Ser: 0.58 mg/dL (ref 0.50–1.10)
GFR calc Af Amer: >90 mL/min (ref >90)
GFR calc non Af Amer: >90 mL/min (ref >90)
Glucose, Bld: 96 mg/dL (ref 70–99)
Potassium: 3.7 mEq/L (ref 3.5–5.1)
Sodium: 134 mEq/L — ABNORMAL LOW (ref 135–145)
Total Bilirubin: 0.1 mg/dL — ABNORMAL LOW (ref 0.3–1.2)
Total Protein: 5.9 g/dL — ABNORMAL LOW (ref 6.0–8.3)

## 2011-06-17 LAB — CBC
HCT: 34.3 % — ABNORMAL LOW (ref 36.0–46.0)
Hemoglobin: 10.9 g/dL — ABNORMAL LOW (ref 12.0–15.0)
MCH: 24.2 pg — ABNORMAL LOW (ref 26.0–34.0)
MCHC: 31.8 g/dL (ref 30.0–36.0)
MCV: 76.2 fL — ABNORMAL LOW (ref 78.0–100.0)
Platelets: 254 10*3/uL (ref 150–400)
RBC: 4.5 MIL/uL (ref 3.87–5.11)
RDW: 17.6 % — ABNORMAL HIGH (ref 11.5–15.5)
WBC: 12.5 10*3/uL — ABNORMAL HIGH (ref 4.0–10.5)

## 2011-06-17 MED ORDER — DOCUSATE SODIUM 100 MG PO CAPS
100.0000 mg | ORAL_CAPSULE | Freq: Every day | ORAL | Status: DC
  Administered 2011-06-18 – 2011-06-25 (×8): 100 mg via ORAL
  Filled 2011-06-17 (×8): qty 1

## 2011-06-17 MED ORDER — FOLIC ACID 1 MG PO TABS
1.0000 mg | ORAL_TABLET | Freq: Every day | ORAL | Status: DC
  Administered 2011-06-18 – 2011-06-25 (×8): 1 mg via ORAL
  Filled 2011-06-17 (×11): qty 1

## 2011-06-17 MED ORDER — CALCIUM CARBONATE ANTACID 500 MG PO CHEW: 2.0000 | CHEWABLE_TABLET | ORAL | Status: DC | PRN

## 2011-06-17 MED ORDER — PROGESTERONE MICRONIZED 200 MG PO CAPS
200.0000 mg | ORAL_CAPSULE | Freq: Every day | ORAL | Status: DC
  Administered 2011-06-17 – 2011-06-25 (×9): 200 mg via VAGINAL
  Filled 2011-06-17 (×11): qty 1

## 2011-06-17 MED ORDER — ZOLPIDEM TARTRATE 10 MG PO TABS: 10.0000 mg | ORAL_TABLET | Freq: Every evening | ORAL | Status: DC | PRN

## 2011-06-17 MED ORDER — PROGESTERONE MICRONIZED 200 MG PO CAPS
200.0000 mg | ORAL_CAPSULE | Freq: Every day | ORAL | Status: DC
  Filled 2011-06-17 (×2): qty 1

## 2011-06-17 MED ORDER — ACETAMINOPHEN 500 MG PO TABS
1000.0000 mg | ORAL_TABLET | Freq: Four times a day (QID) | ORAL | Status: DC | PRN
  Administered 2011-06-17 – 2011-06-26 (×5): 1000 mg via ORAL
  Filled 2011-06-17 (×5): qty 2

## 2011-06-17 MED ORDER — PRENATAL MULTIVITAMIN CH
1.0000 | ORAL_TABLET | Freq: Every day | ORAL | Status: DC
  Administered 2011-06-18 – 2011-06-25 (×8): 1 via ORAL
  Filled 2011-06-17 (×8): qty 1

## 2011-06-17 MED ORDER — METFORMIN HCL 500 MG PO TABS
1000.0000 mg | ORAL_TABLET | Freq: Two times a day (BID) | ORAL | Status: DC
  Administered 2011-06-17 – 2011-06-18 (×2): 1000 mg via ORAL
  Filled 2011-06-17 (×4): qty 2

## 2011-06-17 NOTE — Plan of Care (Signed)
Problem: Consults Goal: Birthing Suites Patient Information Press F2 to bring up selections list   Pt < [redacted] weeks EGA     

## 2011-06-17 NOTE — Patient Instructions (Signed)
Return to clinic for any obstetric concerns or go to [facility] for evaluation  

## 2011-06-17 NOTE — H&P (Signed)
Chart reviewed and agree with management and plan.  

## 2011-06-17 NOTE — Progress Notes (Signed)
BP 145/116, reports headache.  Had ultrasound at [facility] today, final read pending.  Patient still reports abnormal dopplers for Twin B.  Given severe range BP and symptoms, will send to [facility] for observation and further evaluation.  Dr. Shawnie Pons, Attending on call notified, [facility] Admissions also notified.  Patient and her husband agree to plan.

## 2011-06-17 NOTE — H&P (Signed)
Marilyn Wilkinson is a 31 y.o. female presenting for further eval of elevated BP and H/A. Denies any visual disburbances, RUQ pain, leaking, bldg or ctx. Had an [place] by MFM earlier today showing twin B with IUGR and absent EDF. When she had her visit at [facility] afterwards her DBP was approx 115 and therefore was sent to [facility]. 24 hr urine on 4/29= 100  History OB History    Grav Para Term Preterm Abortions TAB SAB Ect Mult Living   1 0 0 0 0 0 0 0 0 0      Past Medical History  Diagnosis Date  . Headache     migraines  . 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 Polycystic ovarian syndrome   . Infertility of tubal origin    Past Surgical History  Procedure Date  . Diagnostic laparoscopy 07/24/2010    REMOVAL OF BILATERAL TUBES BLOCKAGE  . Gastric bypass 2004   Family History: family history includes Arthritis in her maternal grandmother and mother; Asthma in her mother; Diabetes in her father and mother; Fibromyalgia in her mother; and Hypertension in her father and maternal grandmother. Social History:  reports that she has never smoked. She has never used smokeless tobacco. She reports that she drinks alcohol. She reports that she does not use illicit drugs.  ROS    Blood pressure 148/106, pulse 103, temperature 98.1 F (36.7 C), temperature source Oral, resp. rate 18, height 5\' 6"  (1.676 m), weight 117.482 kg (259 lb), last menstrual period 12/14/2010. Maternal Exam:  Uterine Assessment: None per toco     Fetal Exam Fetal Monitor Review: Baseline rate: A= 150 B= 155.  Variability: moderate (6-25 bpm).   Pattern: accelerations present and no decelerations.   occ mi variable     Physical Exam  Constitutional: She is oriented to person, place, and time. She appears well-developed and well-nourished.  HENT:  Head: Normocephalic.  Cardiovascular: Normal rate.   Respiratory: Effort normal.  Musculoskeletal: Normal range of motion.  Neurological: She is  alert and oriented to person, place, and time. She has normal reflexes.  Skin: Skin is warm and dry.  Psychiatric: She has a normal mood and affect. Her behavior is normal. Thought content normal.    Prenatal labs: ABO, Rh: O/POS/-- (01/07 0932) Antibody: NEG (01/07 0932) Rubella: 193.7 (01/07 0932) RPR: NON REAC (01/07 0932)  HBsAg: NEGATIVE (01/07 0932)  HIV: NON REACTIVE (01/07 0932)  GBS: Positive (04/29 0000)   Assessment/Plan: Marilyn Wilkinson twin preg @ 59.3 CHTN r/o superimposed preeclampsia IUGR & Abnl dopplers twin B  Collect CMET and 24 hour urine May require starting Labetalol for BP   Marilyn Wilkinson 06/17/2011, 4:42 PM

## 2011-06-18 ENCOUNTER — Inpatient Hospital Stay (HOSPITAL_COMMUNITY): Payer: BC Managed Care – PPO

## 2011-06-18 LAB — CREATININE CLEARANCE, URINE, 24 HOUR
Collection Interval-CRCL: 24 hours
Creatinine Clearance: 157 mL/min — ABNORMAL HIGH (ref 75–115)
Creatinine, 24H Ur: 1308 mg/d (ref 700–1800)
Creatinine, Urine: 111.31 mg/dL
Creatinine: 0.58 mg/dL (ref 0.50–1.10)
Urine Total Volume-CRCL: 1175 mL

## 2011-06-18 MED ORDER — SODIUM CHLORIDE 0.9 % IJ SOLN
3.0000 mL | Freq: Two times a day (BID) | INTRAMUSCULAR | Status: DC
  Administered 2011-06-19 – 2011-06-25 (×14): 3 mL via INTRAVENOUS

## 2011-06-18 NOTE — Progress Notes (Signed)
Came to evaluate patient as RN had difficulty tracing the heart rate pattern of Twin B.  Twin B visualized on bedside ultrasound, heart noted to be around mother's umbilical area, back up, head to maternal left.  Of note, Twin B had deep variable decelerations earlier today.  Will continue to monitor, discontinue once reassuring FHR tracing is obtained.  She is scheduled for BPP, UA dopplers for Twin B at [facility] tomorrow.   Continue routine antenatal care.  Jaynie Collins, M.D. 06/18/2011 9:16 PM

## 2011-06-18 NOTE — Progress Notes (Signed)
Patient had some concerns about applying for [facility].  Nurse Care manager spoke to patient and [facility] and she spoke to patient and gave her instructions on applying for [facility] in [place]. No other needs identified at this time.

## 2011-06-18 NOTE — Progress Notes (Signed)
Patient ID: Marilyn Wilkinson, female   DOB: [birth year], 31 y.o.   MRN: [id number] FACULTY PRACTICE ANTEPARTUM(COMPREHENSIVE) NOTE  Marilyn Wilkinson is a 31 y.o. G1P0000 at [redacted]w[redacted]d by date of embryo transfer who is admitted for elevated BP and headache.   Fetal presentation is cephalic and transverse. Length of Stay:  1  Days  Subjective: Slept well, no headache at present. Patient reports the fetal movement as active. Patient reports uterine contraction  activity as none. Patient reports  vaginal bleeding as none. Patient describes fluid per vagina as None.  Vitals:  Blood pressure 133/79, pulse 80, temperature 98.1 F (36.7 C), temperature source Oral, resp. rate 20, height 5\' 6"  (1.676 m), weight 117.482 kg (259 lb), last menstrual period 12/14/2010. Physical Examination:  General appearance - alert, well appearing, and in no distress Abdomen - soft, nontender, nondistended, no masses or organomegaly Fundal Height:  size equals dates Extremities: extremities normal, atraumatic, no cyanosis or edema Membranes:intact  Fetal Monitoring: A  Baseline: 140 bpm, Variability: Good {> 6 bpm), Accelerations: Non-reactive but appropriate for gestational age and Decelerations: Absent B-Baseline:  150 bpm, Variability: Good (>6 bpm), Accelerations:  Non-reactive but appropriate for gestational age and Decelerations: mod. Variables occasionally. Labs:  Recent Results (from the past 24 hour(s))  CBC   Collection Time   06/17/11  5:23 PM      Component Value Range   WBC 12.5 (*) 4.0 - 10.5 (K/uL)   RBC 4.50  3.87 - 5.11 (MIL/uL)   Hemoglobin 10.9 (*) 12.0 - 15.0 (g/dL)   HCT 78.2 (*) 95.6 - 46.0 (%)   MCV 76.2 (*) 78.0 - 100.0 (fL)   MCH 24.2 (*) 26.0 - 34.0 (pg)   MCHC 31.8  30.0 - 36.0 (g/dL)   RDW 21.3 (*) 08.6 - 15.5 (%)   Platelets 254  150 - 400 (K/uL)  COMPREHENSIVE METABOLIC PANEL   Collection Time   06/17/11  5:23 PM      Component Value Range   Sodium 134 (*) 135 - 145 (mEq/L)   Potassium  3.7  3.5 - 5.1 (mEq/L)   Chloride 102  96 - 112 (mEq/L)   CO2 23  19 - 32 (mEq/L)   Glucose, Bld 96  70 - 99 (mg/dL)   BUN 12  6 - 23 (mg/dL)   Creatinine, Ser 5.78  0.50 - 1.10 (mg/dL)   Calcium 8.8  8.4 - 46.9 (mg/dL)   Total Protein 5.9 (*) 6.0 - 8.3 (g/dL)   Albumin 2.6 (*) 3.5 - 5.2 (g/dL)   AST 16  0 - 37 (U/L)   ALT 14  0 - 35 (U/L)   Alkaline Phosphatase 72  39 - 117 (U/L)   Total Bilirubin 0.1 (*) 0.3 - 1.2 (mg/dL)   GFR calc non Af Amer >90  >90 (mL/min)   GFR calc Af Amer >90  >90 (mL/min)    Imaging Studies:    A vtx, nml fluid and Dopplers B trv, nml fluid and abnl Dopplers.  Medications:  Scheduled    . docusate sodium  100 mg Oral Daily  . folic acid  1 mg Oral Daily  . metFORMIN  1,000 mg Oral BID WC  . prenatal multivitamin  1 tablet Oral Daily  . progesterone  200 mg Vaginal Daily  . DISCONTD: progesterone  200 mg Oral Daily   I have reviewed the patient's current medications.  ASSESSMENT: Patient Active Problem List  Diagnoses  . PCOS (polycystic ovarian syndrome)  .  Dichorionic diamniotic twin pregnancy  .  Chronic hypertension antepartum  . Chronic hypertension    PLAN: Continue monitoring. 24 hour urine  BP are much improved over last 24 hours-will hold off on medication for now.  Angeleen Horney S 06/18/2011,8:04 AM

## 2011-06-18 NOTE — Progress Notes (Signed)
md notified of fhr of baby b. Will leave on monitor for a while longer.

## 2011-06-18 NOTE — Progress Notes (Signed)
UR chart review completed.  

## 2011-06-18 NOTE — Progress Notes (Signed)
Dr Macon Large called and notified that unable to trace both babies, have tried different positions. Dr Macon Large acknowledged and says that pt can come off monitor for now.

## 2011-06-19 ENCOUNTER — Ambulatory Visit (HOSPITAL_COMMUNITY): Payer: Self-pay

## 2011-06-19 ENCOUNTER — Inpatient Hospital Stay (HOSPITAL_COMMUNITY): Payer: BC Managed Care – PPO

## 2011-06-19 DIAGNOSIS — O10019 Pre-existing essential hypertension complicating pregnancy, unspecified trimester: Secondary | ICD-10-CM

## 2011-06-19 LAB — PROTEIN, URINE, 24 HOUR
Collection Interval-UPROT: 24 hours
Protein, 24H Urine: 94 mg/d (ref 50–100)
Protein, Urine: 8 mg/dL
Urine Total Volume-UPROT: 1175 mL

## 2011-06-19 MED ORDER — METFORMIN HCL 500 MG PO TABS
500.0000 mg | ORAL_TABLET | Freq: Two times a day (BID) | ORAL | Status: DC
  Administered 2011-06-19 – 2011-06-29 (×17): 500 mg via ORAL
  Filled 2011-06-19 (×23): qty 1

## 2011-06-19 NOTE — Progress Notes (Signed)
Patient ID: Marilyn Wilkinson, female   DOB: 06-Mar-1980, 31 y.o.   MRN: [id number] FACULTY PRACTICE ANTEPARTUM(COMPREHENSIVE) NOTE  Marilyn Wilkinson is a 31 y.o. G1P0000 with di/di twins at [redacted]w[redacted]d  by date of embryo transfer who is admitted for elevated BP and headache. Patient also has known IUGR of Twin B and abnormal UA dopplers of Twin B and is being followed closely by MFM.   Fetal presentation is cephalic and transverse. Length of Stay:  2  Days  Subjective: Slept well, no headache, visual changes, RUQ pain at present.  She underwent a BPP overnight as Twin B had repetitive variable decelerations lasting up to 2 minutes, and could not be traced adequately on the monitor.  BPP was 8/8 for both twins as per preliminary report,  Final report pending.  Patient is scheduled for BPP and dopplers with MFM today and tomorrow. Patient reports the fetal movement as active x 2. Patient reports uterine contraction  activity as none. Patient reports  vaginal bleeding as none. Patient describes fluid per vagina as none.  Vitals:  Blood pressure 128/85, pulse 82, temperature 98.1 F (36.7 C), temperature source Oral, resp. rate 18, height 5\' 6"  (1.676 m), weight 117.482 kg (259 lb), last menstrual period 12/14/2010. Temp:  [97.9 F (36.6 C)-98.4 F (36.9 C)] 98.1 F (36.7 C) (05/09 0601) Pulse Rate:  [79-93] 82  (05/09 0601) Resp:  [18-20] 18  (05/09 0650) BP: (128-148)/(85-92) 128/85 mmHg (05/09 0601)  Physical Examination: General appearance - alert, well appearing, and in no distress Abdomen - soft, nontender, nondistended, no masses or organomegaly Fundal Height:  gravid, no tenderness Extremities: extremities normal, atraumatic, no cyanosis or edema Membranes:intact  Fetal Monitoring:  Twin A  Baseline: 140 bpm, Variability: Good {> 6 bpm), Accelerations: Non-reactive but appropriate for gestational age and Decelerations: Absent Twin B Baseline:  150 bpm, Variability: Good (>6 bpm), Accelerations:   Non-reactive but appropriate for gestational age and Decelerations: moderate variables up to about 2 minutes in length   Labs:   Results for orders placed during the hospital encounter of 06/17/11 (from the past 48 hour(s))  PROTEIN, URINE, 24 HOUR     Status: Normal   Collection Time   06/17/11  4:00 PM      Component Value Range Comment   Urine Total Volume-UPROT 1175.0      Collection Interval-UPROT 24      Protein, Urine 8      Protein, 24H Urine 94  50 - 100 (mg/day)   CREATININE CLEARANCE, URINE, 24 HOUR     Status: Abnormal   Collection Time   06/17/11  4:00 PM      Component Value Range Comment   Urine Total Volume-CRCL 1175.0      Collection Interval-CRCL 24      Creatinine, Urine 111.31      Creatinine 0.58  0.50 - 1.10 (mg/dL)    Creatinine, 78G Ur 1308  700 - 1800 (mg/day)    Creatinine Clearance 157 (*) 75 - 115 (mL/min)   CBC     Status: Abnormal   Collection Time   06/17/11  5:23 PM      Component Value Range Comment   WBC 12.5 (*) 4.0 - 10.5 (K/uL)    RBC 4.50  3.87 - 5.11 (MIL/uL)    Hemoglobin 10.9 (*) 12.0 - 15.0 (g/dL)    HCT 95.6 (*) 21.3 - 46.0 (%)    MCV 76.2 (*) 78.0 - 100.0 (fL)    MCH  24.2 (*) 26.0 - 34.0 (pg)    MCHC 31.8  30.0 - 36.0 (g/dL)    RDW 16.1 (*) 09.6 - 15.5 (%)    Platelets 254  150 - 400 (K/uL)   COMPREHENSIVE METABOLIC PANEL     Status: Abnormal   Collection Time   06/17/11  5:23 PM      Component Value Range Comment   Sodium 134 (*) 135 - 145 (mEq/L)    Potassium 3.7  3.5 - 5.1 (mEq/L)    Chloride 102  96 - 112 (mEq/L)    CO2 23  19 - 32 (mEq/L)    Glucose, Bld 96  70 - 99 (mg/dL)    BUN 12  6 - 23 (mg/dL)    Creatinine, Ser 0.45  0.50 - 1.10 (mg/dL)    Calcium 8.8  8.4 - 10.5 (mg/dL)    Total Protein 5.9 (*) 6.0 - 8.3 (g/dL)    Albumin 2.6 (*) 3.5 - 5.2 (g/dL)    AST 16  0 - 37 (U/L)    ALT 14  0 - 35 (U/L)    Alkaline Phosphatase 72  39 - 117 (U/L)    Total Bilirubin 0.1 (*) 0.3 - 1.2 (mg/dL)    GFR calc non Af Amer >90  >90  (mL/min)    GFR calc Af Amer >90  >90 (mL/min)    Imaging Studies:    A vtx, nml fluid and Dopplers, BPP 8/8 B trv, nml fluid and abnl Dopplers, BPP 8/8  Medications:  Scheduled    . docusate sodium  100 mg Oral Daily  . folic acid  1 mg Oral Daily  . metFORMIN  500 mg Oral BID WC  . prenatal multivitamin  1 tablet Oral Daily  . progesterone  200 mg Vaginal Daily  . sodium chloride  3 mL Intravenous Q12H  . DISCONTD: metFORMIN  1,000 mg Oral BID WC   I have reviewed the patient's current medications.  ASSESSMENT: Patient Active Problem List  Diagnoses  . PCOS (polycystic ovarian syndrome)  . Dichorionic diamniotic twin pregnancy  .  Chronic hypertension antepartum  . Chronic hypertension  No evidence of superimposed preeclampsia currently  PLAN: Continue BP monitoring. No need for antihypertensive therapy Follow up on doppler studies; discuss with MFM the need for further inpatient management Routine antenatal care.  [name] 06/19/2011,7:47 AM

## 2011-06-19 NOTE — Progress Notes (Signed)
Pt to mfm via wheelchair

## 2011-06-20 ENCOUNTER — Inpatient Hospital Stay (HOSPITAL_COMMUNITY): Payer: BC Managed Care – PPO

## 2011-06-20 DIAGNOSIS — O365990 Maternal care for other known or suspected poor fetal growth, unspecified trimester, not applicable or unspecified: Secondary | ICD-10-CM

## 2011-06-20 DIAGNOSIS — O1002 Pre-existing essential hypertension complicating childbirth: Secondary | ICD-10-CM

## 2011-06-20 DIAGNOSIS — O30009 Twin pregnancy, unspecified number of placenta and unspecified number of amniotic sacs, unspecified trimester: Secondary | ICD-10-CM

## 2011-06-20 NOTE — Progress Notes (Signed)
FACULTY PRACTICE ANTEPARTUM(COMPREHENSIVE) NOTE  Marilyn Wilkinson is a 31 y.o. G1P0000 at [redacted]w[redacted]d by date of embryo transfer who is admitted for abnormal dopplers baby B.   Fetal presentation is caphalic A and variable B. Length of Stay:  3  Days  Subjective: Pt denies headache, discomfort, bleeding . Patient reports the fetal movement as active. Patient reports uterine contraction  activity as none. Patient reports  vaginal bleeding as none. Patient describes fluid per vagina as None.  Vitals:  Temp:  [97.8 F (36.6 C)-98.2 F (36.8 C)] 98.2 F (36.8 C) (05/10 0129) Pulse Rate:  [71-97] 71  (05/10 0129) Resp:  [18-20] 18  (05/10 0555) BP: (128-151)/(76-100) 131/84 mmHg (05/10 0129) Physical Examination:  General appearance - alert, well appearing, and in no distress Heart - normal rate and regular rhythm Abdomen - soft, nontender, nondistended Fundal Height:  size equals dates Cervical Exam: Not evaluated. and found to be not evaluated/  Extremities: extremities normal, atraumatic, no cyanosis or edema and Homans sign is negative, no sign of DVT with DTRs 2+ bilaterally Membranes:intact  Fetal Monitoring:  patient going to MFM now for daily dopplers and BPP baby B  Labs:  No results found for this or any previous visit (from the past 24 hour(s)).  IMedications:  Scheduled    . docusate sodium  100 mg Oral Daily  . folic acid  1 mg Oral Daily  . metFORMIN  500 mg Oral BID WC  . prenatal multivitamin  1 tablet Oral Daily  . progesterone  200 mg Vaginal Daily  . sodium chloride  3 mL Intravenous Q12H  . DISCONTD: metFORMIN  1,000 mg Oral BID WC   I have reviewed the patient's current medications.  ASSESSMENT: Patient Active Problem List  Diagnoses  . PCOS (polycystic ovarian syndrome)  . Dichorionic diamniotic twin pregnancy  .  Chronic hypertension antepartum  . Chronic hypertension    PLAN: Continue daily BPP baby B,   Lacretia Tindall V 06/20/2011,7:22 AM

## 2011-06-20 NOTE — Progress Notes (Signed)
Patient seen for follow up BPP and Doppler studies.  See report in [facility].  Marilyn Gula, MD   Dichorionic/diamniotic twin pregnancy at 26+6 weeks Twin A: normal amniotic fluid volume; UA dopplers were elevated for this GA .  BPP 6/8 (absent breathing movement), but active Twin B: growth restricted; low normal amniotic fluid volume; UA dopplers with absent end diastolic flow, but not reversed,  BPP 6/8 (absent breathing movement), but also active  Recommend repeat BPP this afternoon/ evening.  If reassuring continue in-house observation. Will need follow up growth scan next week.

## 2011-06-20 NOTE — Progress Notes (Signed)
Pt to mfm for ultrasound 

## 2011-06-20 NOTE — Progress Notes (Signed)
Patient seen for follow up BPP.  See report in [facility].  Both fetuses active with BPPs of 8/8.  Fluid subjectively low in Twin B.  Alpha Gula, MD

## 2011-06-20 NOTE — Progress Notes (Signed)
Dr Jolayne Panther reviewed fhr tracing, states we can remove monitors at this time.

## 2011-06-21 ENCOUNTER — Inpatient Hospital Stay (HOSPITAL_COMMUNITY): Payer: BC Managed Care – PPO

## 2011-06-21 NOTE — Progress Notes (Signed)
FACULTY PRACTICE ANTEPARTUM(COMPREHENSIVE) NOTE  Marilyn Wilkinson is a 31 y.o. G1P0000 at [redacted]w[redacted]d by date of embryo transfer who is admitted for abnormal dopplers baby B.   Fetal presentation is cephalic and variable B. Length of Stay:  4  Days  Subjective: Patient had BPP yesterday 8/8 Baby A and 6/8 baby B, retested yesterday PM as 8/8. Patient reports the fetal movement as active. Patient reports uterine contraction  activity as none. Patient reports  vaginal bleeding as none. Patient describes fluid per vagina as None.  Vitals:  Blood pressure 140/78, pulse 78, temperature 98.1 F (36.7 C), temperature source Oral, resp. rate 20, height 5\' 6"  (1.676 m), weight 117.482 kg (259 lb), last menstrual period 12/14/2010. Physical Examination:  General appearance - alert, well appearing, and in no distress Heart - normal rate and regular rhythm Abdomen - soft, nontender, nondistended Fundal Height:  size equals dates Cervical Exam: Not evaluated. and found to be not evaluated / {Ob Extremities: extremities normal, atraumatic, no cyanosis or edema and Homans sign is negative, no sign of DVT  Membranes:intact  Fetal Monitoring:  Baseline: 140 bpm and Decelerations: Absent  LMedications:  Scheduled    . docusate sodium  100 mg Oral Daily  . folic acid  1 mg Oral Daily  . metFORMIN  500 mg Oral BID WC  . prenatal multivitamin  1 tablet Oral Daily  . progesterone  200 mg Vaginal Daily  . sodium chloride  3 mL Intravenous Q12H   I have reviewed the patient's current medications.  ASSESSMENT: Patient Active Problem List  Diagnoses  . PCOS (polycystic ovarian syndrome)  . Dichorionic diamniotic twin pregnancy  .  Chronic hypertension antepartum  . Chronic hypertension    PLAN:   Daily BPP,   Lewie Deman V 06/21/2011,7:34 AM

## 2011-06-21 NOTE — Progress Notes (Signed)
Dr Jolayne Panther and dr. Margot Ables @ bedside with u/s machine to eval babies hr - when put back on cardio still continuing to monitor 1 baby - new order for bpp in u/s

## 2011-06-22 ENCOUNTER — Inpatient Hospital Stay (HOSPITAL_COMMUNITY): Payer: BC Managed Care – PPO

## 2011-06-22 NOTE — Progress Notes (Signed)
Dr Emelda Fear called - aware that u/s showed intermittent reverse flow on baby b - no new orders @ present

## 2011-06-22 NOTE — Progress Notes (Signed)
Patient ID: Marilyn Wilkinson, female   DOB: April 22, 1980, 31 y.o.   MRN: [id number] FACULTY PRACTICE ANTEPARTUM(COMPREHENSIVE) NOTE  Marilyn Wilkinson is a 31 y.o. G1P0000 at [redacted]w[redacted]d by date of embryo transfer who is admitted for abnormal dopplers baby B. baby B.   Fetal presentation is cephalic and variable B. Length of Stay:  5  Days  Subjective: Patient doing well without any complaints. Patient reports the fetal movement as active. Patient reports uterine contraction  activity as none. Patient reports  vaginal bleeding as none. Patient describes fluid per vagina as None.  Vitals:  Blood pressure 141/88, pulse 78, temperature 98.3 F (36.8 C), temperature source Oral, resp. rate 20, height 5\' 6"  (1.676 m), weight 117.482 kg (259 lb), last menstrual period 12/14/2010. Physical Examination:  General appearance - alert, well appearing, and in no distress Heart - normal rate and regular rhythm Abdomen - soft, nontender, nondistended Fundal Height:  Size consistent with twin pregnancy Cervical Exam: Not evaluated. and found to be not evaluated / {Ob Extremities: extremities normal, atraumatic, no cyanosis or edema and Homans sign is negative, no sign of DVT  Membranes:intact  Fetal Monitoring:  Unable to obtain NST yesterday evening. BPP 8/8 for both fetuses in am with absent dopplers in baby B. BPP 8/8 for both twins in the evening.  LMedications:  Scheduled    . docusate sodium  100 mg Oral Daily  . folic acid  1 mg Oral Daily  . metFORMIN  500 mg Oral BID WC  . prenatal multivitamin  1 tablet Oral Daily  . progesterone  200 mg Vaginal Daily  . sodium chloride  3 mL Intravenous Q12H   I have reviewed the patient's current medications.  ASSESSMENT: Patient Active Problem List  Diagnoses  . PCOS (polycystic ovarian syndrome)  . Dichorionic diamniotic twin pregnancy  .  Chronic hypertension antepartum  . Chronic hypertension    PLAN:   31 yo G1P0 with Di-Di twin pregnancy admitted due to  abnormal dopplers in baby B -Continue daily BPP and dopplers - continue current care  Sonal Dorwart 06/22/2011,6:50 AM

## 2011-06-23 ENCOUNTER — Inpatient Hospital Stay (HOSPITAL_COMMUNITY): Payer: BC Managed Care – PPO

## 2011-06-23 DIAGNOSIS — O365990 Maternal care for other known or suspected poor fetal growth, unspecified trimester, not applicable or unspecified: Secondary | ICD-10-CM

## 2011-06-23 DIAGNOSIS — I10 Essential (primary) hypertension: Secondary | ICD-10-CM

## 2011-06-23 DIAGNOSIS — O368990 Maternal care for other specified fetal problems, unspecified trimester, not applicable or unspecified: Secondary | ICD-10-CM

## 2011-06-23 NOTE — Anesthesia Preprocedure Evaluation (Signed)
Anesthesia Evaluation  Patient identified by MRN, date of birth, ID band Patient awake    Reviewed: Allergy & Precautions, H&P , NPO status , Patient's Chart, lab work & pertinent test results  Airway Mallampati: III      Dental No notable dental hx.    Pulmonary neg pulmonary ROS,  breath sounds clear to auscultation  Pulmonary exam normal       Cardiovascular Exercise Tolerance: Good hypertension, negative cardio ROS  Rhythm:regular Rate:Normal     Neuro/Psych  Headaches, negative neurological ROS  negative psych ROS   GI/Hepatic negative GI ROS, Neg liver ROS,   Endo/Other  negative endocrine ROSMorbid obesity  Renal/GU negative Renal ROS  negative genitourinary   Musculoskeletal   Abdominal Normal abdominal exam  (+)   Peds  Hematology negative hematology ROS (+)   Anesthesia Other Findings Headache   migraines Fallopian tube disorder   BLOCKAGE ON BILATERAL TUBE    Polycystic ovarian syndrome     Infertility of tubal origin    Reproductive/Obstetrics (+) Pregnancy                           Anesthesia Physical Anesthesia Plan  ASA: III  Anesthesia Plan: Spinal   Post-op Pain Management:    Induction:   Airway Management Planned:   Additional Equipment:   Intra-op Plan:   Post-operative Plan:   Informed Consent: I have reviewed the patients History and Physical, chart, labs and discussed the procedure including the risks, benefits and alternatives for the proposed anesthesia with the patient or authorized representative who has indicated his/her understanding and acceptance.     Plan Discussed with: Anesthesiologist, CRNA and Surgeon  Anesthesia Plan Comments:        Absent EDF by doppler on baby B. Discussed general versus spinal.  Anesthesia Quick Evaluation

## 2011-06-23 NOTE — Progress Notes (Signed)
Pt to MFM via wheelchair.

## 2011-06-23 NOTE — Progress Notes (Signed)
UR chart review completed.  

## 2011-06-23 NOTE — Consult Note (Signed)
Neonatology Consult to Antenatal Patient:  Ms. Pun was admitted on 5/7 and is currently at 33 2/[redacted] weeks GA. The pregnancy is complicated by Di-Di twin gestation with known IUGR, subjectively lower AFI, and absent EDF for Twin B. The mother has chronic hypertension with superimposed PIH, and she has polycystic ovary disease, on Metformin. She is admitted for close observation and the BPP for both babies was 8/8 today. She has received BMZ on 4/29-30. The babies are both female.  I spoke with the patient, her husband, and several family members. We discussed the worst case of delivery in the next 1-2 days, including usual DR management, possible respiratory complications and need for support, IV access, feedings (mother desires breast feeding, which was encouraged), LOS, Mortality and Morbidity, and long term outcomes. They had only a few questions at this time, which I answered. I offered a NICU tour to any interested family members and would be glad to come back if the patient or her husband have more questions later.  Thank you for asking me to see this patient.  Deatra James, MD Neonatologist  Time spent: 25 minutes [phone])

## 2011-06-23 NOTE — Progress Notes (Signed)
Patient ID: Marilyn Wilkinson, female   DOB: [birth year], 31 y.o.   MRN: [id number] FACULTY PRACTICE ANTEPARTUM(COMPREHENSIVE) NOTE  Marilyn Wilkinson is a 31 y.o. G1P0000 at [redacted]w[redacted]d by date of embryo transfer who is admitted for abnormal dopplers baby B.   Fetal presentation is cephalic forTwin A and variable for Twin B. Length of Stay:  6  Days  Subjective: Patient doing well without any complaints. Patient reports the fetal movement as active. Patient reports uterine contraction  activity as none. Patient reports  vaginal bleeding as none. Patient describes fluid per vagina as None.  Vitals:  Blood pressure 126/80, pulse 77, temperature 98.4 F (36.9 C), temperature source Oral, resp. rate 20, height 5\' 6"  (1.676 m), weight 117.482 kg (259 lb), last menstrual period 12/14/2010, SpO2 98.00%. Physical Examination: General appearance - alert, well appearing, and in no distress Heart - normal rate and regular rhythm Abdomen - soft, nontender, nondistended Fundal Height:  Size consistent with twin pregnancy Cervical Exam: Not evaluated. and found to be not evaluated Extremities: extremities normal, atraumatic, no cyanosis or edema and Homans sign is negative, no sign of DVT  Membranes:intact  Fetal Monitoring:  MFM read: BPP 8/8 for both twins, Twin B with subjectively decreased AFV with absent end-diastolic flow and intermittent mid diastolic reverse flow.  Medications:      . docusate sodium  100 mg Oral Daily  . folic acid  1 mg Oral Daily  . metFORMIN  500 mg Oral BID WC  . prenatal multivitamin  1 tablet Oral Daily  . progesterone  200 mg Vaginal Daily  . sodium chloride  3 mL Intravenous Q12H   I have reviewed the patient's current medications.  ASSESSMENT: Patient Active Problem List  Diagnoses  . PCOS (polycystic ovarian syndrome)  . Dichorionic diamniotic twin pregnancy  .  Chronic hypertension antepartum  . Chronic hypertension   PLAN:   31 yo G1P0 with Di-Di twin pregnancy  and CHTN admitted due to abnormal dopplers in Twin B.  Twin B now with absent end-diastolic flow and intermittent mid diastolic reverse flow. Discussed findings with patient.  Also discussed what would happen in the situation of indicated delivery for fetal distress or persistent reverse flow on dopplers; had detailed description of what occurs in emergent/stat or urgent cesarean deliveries.  The risks of cesarean section discussed with the patient included but were not limited to: bleeding which may require transfusion or reoperation; infection which may require antibiotics; injury to bowel, bladder, ureters or other surrounding organs; injury to the fetus; need for additional procedures including hysterectomy in the event of a life-threatening hemorrhage; placental abnormalities wth subsequent pregnancies, incisional problems, thromboembolic phenomenon and other postoperative/anesthesia complications.  All questions answered.  Written consent obtained and placed in her blue chart; will be valid for 30 days.  -Continue daily BPP and dopplers at MFM -Will follow up MFM recommendations -Labs including Type and Screen every 3 days; always keep saline lock in place -Anesthesiology and Neonatology consults ordered;  Spoke with both attendings on call (Dr Brayton Caves for Anesthesia and Dr. Francine Graven for neonatology) -Continue current inpatient antenatal care  [facility] A 06/23/2011,10:24 AM

## 2011-06-23 NOTE — Progress Notes (Signed)
Dr Macon Large at bedside. Per MD will hold off on am monitoring at this time. Since she had ultrasound done this am.

## 2011-06-24 ENCOUNTER — Inpatient Hospital Stay (HOSPITAL_COMMUNITY): Payer: BC Managed Care – PPO

## 2011-06-24 DIAGNOSIS — O43899 Other placental disorders, unspecified trimester: Secondary | ICD-10-CM | POA: Diagnosis not present

## 2011-06-24 DIAGNOSIS — O365990 Maternal care for other known or suspected poor fetal growth, unspecified trimester, not applicable or unspecified: Secondary | ICD-10-CM | POA: Diagnosis present

## 2011-06-24 DIAGNOSIS — O368990 Maternal care for other specified fetal problems, unspecified trimester, not applicable or unspecified: Secondary | ICD-10-CM | POA: Diagnosis not present

## 2011-06-24 LAB — COMPREHENSIVE METABOLIC PANEL
ALT: 16 U/L (ref 0–35)
AST: 16 U/L (ref 0–37)
Albumin: 2.4 g/dL — ABNORMAL LOW (ref 3.5–5.2)
Alkaline Phosphatase: 81 U/L (ref 39–117)
BUN: 11 mg/dL (ref 6–23)
CO2: 23 mEq/L (ref 19–32)
Calcium: 8.5 mg/dL (ref 8.4–10.5)
Chloride: 106 mEq/L (ref 96–112)
Creatinine, Ser: 0.62 mg/dL (ref 0.50–1.10)
GFR calc Af Amer: >90 mL/min (ref >90)
GFR calc non Af Amer: >90 mL/min (ref >90)
Glucose, Bld: 66 mg/dL — ABNORMAL LOW (ref 70–99)
Potassium: 4.5 mEq/L (ref 3.5–5.1)
Sodium: 137 mEq/L (ref 135–145)
Total Bilirubin: 0.2 mg/dL — ABNORMAL LOW (ref 0.3–1.2)
Total Protein: 5.4 g/dL — ABNORMAL LOW (ref 6.0–8.3)

## 2011-06-24 LAB — CBC
HCT: 34.8 % — ABNORMAL LOW (ref 36.0–46.0)
Hemoglobin: 11 g/dL — ABNORMAL LOW (ref 12.0–15.0)
MCH: 24.4 pg — ABNORMAL LOW (ref 26.0–34.0)
MCHC: 31.6 g/dL (ref 30.0–36.0)
MCV: 77.2 fL — ABNORMAL LOW (ref 78.0–100.0)
Platelets: 243 10*3/uL (ref 150–400)
RBC: 4.51 MIL/uL (ref 3.87–5.11)
RDW: 17.5 % — ABNORMAL HIGH (ref 11.5–15.5)
WBC: 9.5 10*3/uL (ref 4.0–10.5)

## 2011-06-24 LAB — TYPE AND SCREEN
ABO/RH(D): O POS
Antibody Screen: NEGATIVE

## 2011-06-24 LAB — ABO/RH: ABO/RH(D): O POS

## 2011-06-24 LAB — HEMOGLOBIN A1C
Hgb A1c MFr Bld: 5 % (ref <5.7)
Mean Plasma Glucose: 97 mg/dL (ref <117)

## 2011-06-24 NOTE — Progress Notes (Signed)
Patient ID: Marilyn Wilkinson, female   DOB: [birth year], 31 y.o.   MRN: [id number]  FACULTY PRACTICE ANTEPARTUM(COMPREHENSIVE) NOTE  Marilyn Wilkinson is a 31 y.o. G1P0000 at [redacted]w[redacted]d  who is admitted for HTN, abnml dopplers.   Length of Stay:  7  Days  Subjective: Patient had mild headache.  None now Patient reports the fetal movement as active. Patient reports uterine contraction  activity as none. Patient reports  vaginal bleeding as none. Patient describes fluid per vagina as None.  Vitals:  Blood pressure 123/65, pulse 79, temperature 98.1 F (36.7 C), temperature source Oral, resp. rate 18, height 5\' 6"  (1.676 m), weight 117.482 kg (259 lb), last menstrual period 12/14/2010, SpO2 98.00%. Physical Examination:  General appearance - alert, well appearing, and in no distress Abdomen - soft, nontender Extremities - no edema, redness or tenderness in the calves or thighs  Fetal Monitoring: A:  Baseline 150, 10 x 10 accels, no decels,  B:  Baseline 155, 10 x 10 accels, moderate variability, ? One decel, RN believes that it was maternal Labs:  Recent Results (from the past 24 hour(s))  CBC   Collection Time   06/24/11  5:20 AM      Component Value Range   WBC 9.5  4.0 - 10.5 (K/uL)   RBC 4.51  3.87 - 5.11 (MIL/uL)   Hemoglobin 11.0 (*) 12.0 - 15.0 (g/dL)   HCT 93.8 (*) 10.1 - 46.0 (%)   MCV 77.2 (*) 78.0 - 100.0 (fL)   MCH 24.4 (*) 26.0 - 34.0 (pg)   MCHC 31.6  30.0 - 36.0 (g/dL)   RDW 75.1 (*) 02.5 - 15.5 (%)   Platelets 243  150 - 400 (K/uL)  TYPE AND SCREEN   Collection Time   06/24/11  5:20 AM      Component Value Range   ABO/RH(D) O POS     Antibody Screen NEG     Sample Expiration 06/27/2011    COMPREHENSIVE METABOLIC PANEL   Collection Time   06/24/11  5:20 AM      Component Value Range   Sodium 137  135 - 145 (mEq/L)   Potassium 4.5  3.5 - 5.1 (mEq/L)   Chloride 106  96 - 112 (mEq/L)   CO2 23  19 - 32 (mEq/L)   Glucose, Bld 66 (*) 70 - 99 (mg/dL)   BUN 11  6 - 23 (mg/dL)    Creatinine, Ser 8.52  0.50 - 1.10 (mg/dL)   Calcium 8.5  8.4 - 77.8 (mg/dL)   Total Protein 5.4 (*) 6.0 - 8.3 (g/dL)   Albumin 2.4 (*) 3.5 - 5.2 (g/dL)   AST 16  0 - 37 (U/L)   ALT 16  0 - 35 (U/L)   Alkaline Phosphatase 81  39 - 117 (U/L)   Total Bilirubin 0.2 (*) 0.3 - 1.2 (mg/dL)   GFR calc non Af Amer >90  >90 (mL/min)   GFR calc Af Amer >90  >90 (mL/min)    Imaging Studies:     Medications:  Scheduled    . docusate sodium  100 mg Oral Daily  . folic acid  1 mg Oral Daily  . metFORMIN  500 mg Oral BID WC  . prenatal multivitamin  1 tablet Oral Daily  . progesterone  200 mg Vaginal Daily  . sodium chloride  3 mL Intravenous Q12H    ASSESSMENT: Patient Active Problem List  Diagnoses  . PCOS (polycystic ovarian syndrome)  . Dichorionic diamniotic twin  pregnancy  .  Chronic hypertension antepartum  . Chronic hypertension    PLAN: 31 G1 P0 at 27 weeks 3 days with absent EDF and intermittent reverse EDF Twin B.  No signs of pre eclampsia currently. Daily dopplers.  BID NSTs  Angles Trevizo H. 06/24/2011,7:53 AM

## 2011-06-24 NOTE — Progress Notes (Signed)
06/24/11 0900  Clinical Encounter Type  Visited With Patient and family together (Husband Marilyn Wilkinson)  Visit Type Follow-up;Spiritual support;Social support  Spiritual Encounters  Spiritual Needs Emotional;Prayer  Stress Factors  Family Stress Factors (Adjusting to being here for the "long haul.")    Lovely follow-up visit with Marilyn Wilkinson and husband Marilyn Wilkinson as they work through the emotional and spiritual implications of being here until delivery and the rollercoaster of unknowing through that time and likely NICU stay.  Family very welcoming of pastoral presence, prayer.  Provided opportunity for couple to reflect on learning and growth through marriage (fourth anniversary coming on 08/13/11), offering pastoral reflection, encouragement, and prayer of gratitude/blessing at bedside per [name]'s request.  Family uses humor, prayer, compromise, gratitude to cope, and reports good family support.  Spiritual Care will continue to follow for spiritual and emotional support, prayer, and pastoral reflection along this journey.  [address] [place], [place] [phone]

## 2011-06-25 ENCOUNTER — Inpatient Hospital Stay (HOSPITAL_COMMUNITY): Payer: BC Managed Care – PPO

## 2011-06-25 LAB — OB RESULTS CONSOLE GC/CHLAMYDIA
Chlamydia: NEGATIVE
Gonorrhea: NEGATIVE

## 2011-06-25 NOTE — Progress Notes (Signed)
FACULTY PRACTICE ANTEPARTUM(COMPREHENSIVE) NOTE  Marilyn Wilkinson is a 31 y.o. G1P0000 at [redacted]w[redacted]d  who is admitted for HTN, abnml dopplers. Twin A has intermittent AEDF; Twin B has AEDF with intermittent REDF  Length of Stay:  8  Days  Subjective: Patient has no complaints. Patient reports the fetal movement as active. Patient reports uterine contraction  activity as none. Patient reports  vaginal bleeding as none. Patient describes fluid per vagina as None.  Vitals:  Blood pressure 139/80, pulse 92, temperature 98.3 F (36.8 C), temperature source Oral, resp. rate 16, height 5\' 6"  (1.676 m), weight 117.482 kg (259 lb), last menstrual period 12/14/2010, SpO2 98.00%. Physical Examination: General appearance - alert, well appearing, and in no distress Abdomen - soft, nontender Extremities - no edema, redness or tenderness in the calves or thighs  Fetal Monitoring: A:  Baseline 150, 10 x 10 accels, no decels,  B:  Baseline 155, 10 x 10 accels, moderate variability,  no decel Labs:  No results found for this or any previous visit (from the past 24 hour(s)).  Medications:  Scheduled    . docusate sodium  100 mg Oral Daily  . folic acid  1 mg Oral Daily  . metFORMIN  500 mg Oral BID WC  . prenatal multivitamin  1 tablet Oral Daily  . progesterone  200 mg Vaginal Daily  . sodium chloride  3 mL Intravenous Q12H    ASSESSMENT: Patient Active Problem List  Diagnoses  . PCOS (polycystic ovarian syndrome)  . Dichorionic diamniotic twin pregnancy  .  Chronic hypertension antepartum  . Chronic hypertension  . Twin B with absent end diastolic flow on UA dopplers, intermittent reverse flow  . Twin B with IUGR    PLAN: 30 G1 P0 at 27 weeks 4 days with absent EDF and intermittent reverse EDF Twin B.  No signs of pre eclampsia currently. Daily dopplers.  BID NSTs Scheduled for growth scan tomorrow Routine antenatal care  Ario Mcdiarmid A 06/25/2011,7:28 AM

## 2011-06-25 NOTE — Progress Notes (Signed)
UR chart review completed.  

## 2011-06-26 ENCOUNTER — Encounter (HOSPITAL_COMMUNITY): Payer: Self-pay | Admitting: Anesthesiology

## 2011-06-26 ENCOUNTER — Encounter (HOSPITAL_COMMUNITY): Payer: Self-pay | Admitting: *Deleted

## 2011-06-26 ENCOUNTER — Inpatient Hospital Stay (HOSPITAL_COMMUNITY): Payer: BC Managed Care – PPO

## 2011-06-26 ENCOUNTER — Inpatient Hospital Stay (HOSPITAL_COMMUNITY): Payer: BC Managed Care – PPO | Admitting: Anesthesiology

## 2011-06-26 ENCOUNTER — Encounter (HOSPITAL_COMMUNITY)
Admission: AD | Disposition: A | Discharge status: Home / Self Care | Payer: Self-pay | Source: Ambulatory Visit | Attending: Obstetrics & Gynecology

## 2011-06-26 DIAGNOSIS — O365990 Maternal care for other known or suspected poor fetal growth, unspecified trimester, not applicable or unspecified: Secondary | ICD-10-CM

## 2011-06-26 DIAGNOSIS — O1002 Pre-existing essential hypertension complicating childbirth: Secondary | ICD-10-CM

## 2011-06-26 DIAGNOSIS — O30009 Twin pregnancy, unspecified number of placenta and unspecified number of amniotic sacs, unspecified trimester: Secondary | ICD-10-CM

## 2011-06-26 LAB — CBC
HCT: 35.8 % — ABNORMAL LOW (ref 36.0–46.0)
Hemoglobin: 11.4 g/dL — ABNORMAL LOW (ref 12.0–15.0)
MCH: 24.5 pg — ABNORMAL LOW (ref 26.0–34.0)
MCHC: 31.8 g/dL (ref 30.0–36.0)
MCV: 76.8 fL — ABNORMAL LOW (ref 78.0–100.0)
Platelets: 242 10*3/uL (ref 150–400)
RBC: 4.66 MIL/uL (ref 3.87–5.11)
RDW: 17.3 % — ABNORMAL HIGH (ref 11.5–15.5)
WBC: 10.2 10*3/uL (ref 4.0–10.5)

## 2011-06-26 LAB — OB RESULTS CONSOLE GC/CHLAMYDIA
Chlamydia: NEGATIVE
Gonorrhea: NEGATIVE

## 2011-06-26 SURGERY — Surgical Case
Anesthesia: Spinal | Site: Abdomen | Wound class: Clean Contaminated

## 2011-06-26 MED ORDER — PHENYLEPHRINE 40 MCG/ML (10ML) SYRINGE FOR IV PUSH (FOR BLOOD PRESSURE SUPPORT)
PREFILLED_SYRINGE | INTRAVENOUS | Status: AC
  Filled 2011-06-26: qty 5

## 2011-06-26 MED ORDER — BUPIVACAINE HCL 0.75 % IJ SOLN
INTRAMUSCULAR | Status: DC | PRN
  Administered 2011-06-26: 1.6 mL via INTRATHECAL

## 2011-06-26 MED ORDER — LACTATED RINGERS IV SOLN
INTRAVENOUS | Status: DC
  Administered 2011-06-26 (×3): via INTRAVENOUS

## 2011-06-26 MED ORDER — FENTANYL CITRATE 0.05 MG/ML IJ SOLN: 25.0000 ug | INTRAMUSCULAR | Status: DC | PRN

## 2011-06-26 MED ORDER — FENTANYL CITRATE 0.05 MG/ML IJ SOLN
INTRAMUSCULAR | Status: DC | PRN
  Administered 2011-06-26: 25 ug via INTRATHECAL

## 2011-06-26 MED ORDER — NALBUPHINE HCL 10 MG/ML IJ SOLN: 5.0000 mg | INTRAMUSCULAR | Status: DC | PRN

## 2011-06-26 MED ORDER — EPHEDRINE SULFATE 50 MG/ML IJ SOLN
INTRAMUSCULAR | Status: DC | PRN
  Administered 2011-06-26: 10 mg via INTRAVENOUS

## 2011-06-26 MED ORDER — OXYTOCIN 10 UNIT/ML IJ SOLN
INTRAMUSCULAR | Status: AC
  Filled 2011-06-26: qty 2

## 2011-06-26 MED ORDER — DIPHENHYDRAMINE HCL 25 MG PO CAPS: 25.0000 mg | ORAL_CAPSULE | Freq: Four times a day (QID) | ORAL | Status: DC | PRN

## 2011-06-26 MED ORDER — DIPHENHYDRAMINE HCL 50 MG/ML IJ SOLN
12.5000 mg | INTRAMUSCULAR | Status: DC | PRN
  Administered 2011-06-26 – 2011-06-27 (×2): 12.5 mg via INTRAVENOUS
  Filled 2011-06-26 (×2): qty 1

## 2011-06-26 MED ORDER — MAGNESIUM SULFATE BOLUS VIA INFUSION
4.0000 g | Freq: Once | INTRAVENOUS | Status: AC
  Administered 2011-06-26: 4 g via INTRAVENOUS
  Filled 2011-06-26: qty 500

## 2011-06-26 MED ORDER — ONDANSETRON HCL 4 MG/2ML IJ SOLN
INTRAMUSCULAR | Status: AC
  Filled 2011-06-26: qty 2

## 2011-06-26 MED ORDER — PROMETHAZINE HCL 25 MG/ML IJ SOLN: 6.2500 mg | INTRAMUSCULAR | Status: DC | PRN

## 2011-06-26 MED ORDER — LACTATED RINGERS IV SOLN
INTRAVENOUS | Status: DC
  Administered 2011-06-27: 03:00:00 via INTRAVENOUS

## 2011-06-26 MED ORDER — SODIUM CHLORIDE 0.9 % IV SOLN: 1.0000 ug/kg/h | INTRAVENOUS | Status: DC | PRN

## 2011-06-26 MED ORDER — CITRIC ACID-SODIUM CITRATE 334-500 MG/5ML PO SOLN
ORAL | Status: AC
  Administered 2011-06-26: 30 mL
  Filled 2011-06-26: qty 15

## 2011-06-26 MED ORDER — LANOLIN HYDROUS EX OINT: 1.0000 "application " | TOPICAL_OINTMENT | CUTANEOUS | Status: DC | PRN

## 2011-06-26 MED ORDER — CEFAZOLIN SODIUM-DEXTROSE 2-3 GM-% IV SOLR
2.0000 g | INTRAVENOUS | Status: AC
  Administered 2011-06-26: 2 g via INTRAVENOUS
  Filled 2011-06-26: qty 50

## 2011-06-26 MED ORDER — MIDAZOLAM HCL 2 MG/2ML IJ SOLN: 0.5000 mg | Freq: Once | INTRAMUSCULAR | Status: DC | PRN

## 2011-06-26 MED ORDER — PRENATAL MULTIVITAMIN CH
1.0000 | ORAL_TABLET | Freq: Every day | ORAL | Status: DC
  Administered 2011-06-27 – 2011-06-28 (×2): 1 via ORAL
  Filled 2011-06-26 (×2): qty 1

## 2011-06-26 MED ORDER — MORPHINE SULFATE 0.5 MG/ML IJ SOLN
INTRAMUSCULAR | Status: AC
  Filled 2011-06-26: qty 10

## 2011-06-26 MED ORDER — EPHEDRINE 5 MG/ML INJ
INTRAVENOUS | Status: AC
  Filled 2011-06-26: qty 10

## 2011-06-26 MED ORDER — ONDANSETRON HCL 4 MG PO TABS: 4.0000 mg | ORAL_TABLET | ORAL | Status: DC | PRN

## 2011-06-26 MED ORDER — NALOXONE HCL 0.4 MG/ML IJ SOLN: 0.4000 mg | INTRAMUSCULAR | Status: DC | PRN

## 2011-06-26 MED ORDER — KETOROLAC TROMETHAMINE 30 MG/ML IJ SOLN
INTRAMUSCULAR | Status: AC
  Administered 2011-06-26: 30 mg via INTRAVENOUS
  Filled 2011-06-26: qty 1

## 2011-06-26 MED ORDER — SIMETHICONE 80 MG PO CHEW
80.0000 mg | CHEWABLE_TABLET | Freq: Three times a day (TID) | ORAL | Status: DC
  Administered 2011-06-27 – 2011-06-28 (×5): 80 mg via ORAL

## 2011-06-26 MED ORDER — ONDANSETRON HCL 4 MG/2ML IJ SOLN: 4.0000 mg | Freq: Three times a day (TID) | INTRAMUSCULAR | Status: DC | PRN

## 2011-06-26 MED ORDER — DIBUCAINE 1 % RE OINT: 1.0000 "application " | TOPICAL_OINTMENT | RECTAL | Status: DC | PRN

## 2011-06-26 MED ORDER — IBUPROFEN 600 MG PO TABS
600.0000 mg | ORAL_TABLET | Freq: Four times a day (QID) | ORAL | Status: DC
  Administered 2011-06-26 – 2011-06-29 (×11): 600 mg via ORAL
  Filled 2011-06-26 (×12): qty 1

## 2011-06-26 MED ORDER — ONDANSETRON HCL 4 MG/2ML IJ SOLN: 4.0000 mg | INTRAMUSCULAR | Status: DC | PRN

## 2011-06-26 MED ORDER — FENTANYL CITRATE 0.05 MG/ML IJ SOLN
INTRAMUSCULAR | Status: AC
  Filled 2011-06-26: qty 2

## 2011-06-26 MED ORDER — KETOROLAC TROMETHAMINE 30 MG/ML IJ SOLN: 30.0000 mg | Freq: Four times a day (QID) | INTRAMUSCULAR | Status: DC | PRN

## 2011-06-26 MED ORDER — MENTHOL 3 MG MT LOZG: 1.0000 | LOZENGE | OROMUCOSAL | Status: DC | PRN

## 2011-06-26 MED ORDER — OXYTOCIN 20 UNITS IN LACTATED RINGERS INFUSION - SIMPLE
125.0000 mL/h | INTRAVENOUS | Status: AC
  Administered 2011-06-26: 125 mL/h via INTRAVENOUS
  Filled 2011-06-26: qty 1000

## 2011-06-26 MED ORDER — SIMETHICONE 80 MG PO CHEW: 80.0000 mg | CHEWABLE_TABLET | ORAL | Status: DC | PRN

## 2011-06-26 MED ORDER — MEPERIDINE HCL 25 MG/ML IJ SOLN: 6.2500 mg | INTRAMUSCULAR | Status: DC | PRN

## 2011-06-26 MED ORDER — SODIUM CHLORIDE 0.9 % IJ SOLN: 3.0000 mL | INTRAMUSCULAR | Status: DC | PRN

## 2011-06-26 MED ORDER — OXYTOCIN 10 UNIT/ML IJ SOLN
INTRAMUSCULAR | Status: DC | PRN
  Administered 2011-06-26: 20 [IU] via INTRAMUSCULAR

## 2011-06-26 MED ORDER — KETOROLAC TROMETHAMINE 30 MG/ML IJ SOLN
30.0000 mg | Freq: Four times a day (QID) | INTRAMUSCULAR | Status: DC | PRN
  Administered 2011-06-26: 30 mg via INTRAVENOUS

## 2011-06-26 MED ORDER — ACETAMINOPHEN 10 MG/ML IV SOLN: 1000.0000 mg | Freq: Four times a day (QID) | INTRAVENOUS | Status: AC | PRN

## 2011-06-26 MED ORDER — WITCH HAZEL-GLYCERIN EX PADS: 1.0000 "application " | MEDICATED_PAD | CUTANEOUS | Status: DC | PRN

## 2011-06-26 MED ORDER — MAGNESIUM SULFATE 40 G IN LACTATED RINGERS - SIMPLE
2.0000 g/h | INTRAVENOUS | Status: DC
  Filled 2011-06-26: qty 500

## 2011-06-26 MED ORDER — FENTANYL CITRATE 0.05 MG/ML IJ SOLN
INTRAMUSCULAR | Status: DC | PRN
  Administered 2011-06-26: 25 ug via INTRAVENOUS
  Administered 2011-06-26: 50 ug via INTRAVENOUS

## 2011-06-26 MED ORDER — ONDANSETRON HCL 4 MG/2ML IJ SOLN
INTRAMUSCULAR | Status: DC | PRN
  Administered 2011-06-26: 4 mg via INTRAVENOUS

## 2011-06-26 MED ORDER — OXYTOCIN 20 UNITS IN LACTATED RINGERS INFUSION - SIMPLE
INTRAVENOUS | Status: AC
  Administered 2011-06-26: 20 [IU] via INTRAVENOUS
  Filled 2011-06-26: qty 1000

## 2011-06-26 MED ORDER — ZOLPIDEM TARTRATE 5 MG PO TABS: 5.0000 mg | ORAL_TABLET | Freq: Every evening | ORAL | Status: DC | PRN

## 2011-06-26 MED ORDER — SENNOSIDES-DOCUSATE SODIUM 8.6-50 MG PO TABS
2.0000 | ORAL_TABLET | Freq: Every day | ORAL | Status: DC
  Administered 2011-06-27 – 2011-06-28 (×2): 2 via ORAL

## 2011-06-26 MED ORDER — OXYCODONE-ACETAMINOPHEN 5-325 MG PO TABS
1.0000 | ORAL_TABLET | ORAL | Status: DC | PRN
  Administered 2011-06-27 – 2011-06-29 (×6): 2 via ORAL
  Filled 2011-06-26 (×6): qty 2

## 2011-06-26 MED ORDER — MORPHINE SULFATE (PF) 0.5 MG/ML IJ SOLN
INTRAMUSCULAR | Status: DC | PRN
  Administered 2011-06-26: 100 ug via INTRATHECAL

## 2011-06-26 MED ORDER — DIPHENHYDRAMINE HCL 25 MG PO CAPS
25.0000 mg | ORAL_CAPSULE | ORAL | Status: DC | PRN
  Administered 2011-06-27: 25 mg via ORAL
  Filled 2011-06-26: qty 1

## 2011-06-26 MED ORDER — MORPHINE SULFATE (PF) 0.5 MG/ML IJ SOLN
INTRAMUSCULAR | Status: DC | PRN
  Administered 2011-06-26: 2000 ug via EPIDURAL
  Administered 2011-06-26: 2000 ug via INTRAVENOUS

## 2011-06-26 MED ORDER — DIPHENHYDRAMINE HCL 50 MG/ML IJ SOLN: 25.0000 mg | INTRAMUSCULAR | Status: DC | PRN

## 2011-06-26 MED ORDER — METOCLOPRAMIDE HCL 5 MG/ML IJ SOLN: 10.0000 mg | Freq: Three times a day (TID) | INTRAMUSCULAR | Status: DC | PRN

## 2011-06-26 MED ORDER — SCOPOLAMINE 1 MG/3DAYS TD PT72: 1.0000 | MEDICATED_PATCH | Freq: Once | TRANSDERMAL | Status: DC

## 2011-06-26 MED ORDER — TETANUS-DIPHTH-ACELL PERTUSSIS 5-2.5-18.5 LF-MCG/0.5 IM SUSP
0.5000 mL | Freq: Once | INTRAMUSCULAR | Status: AC
  Administered 2011-06-29: 0.5 mL via INTRAMUSCULAR
  Filled 2011-06-26: qty 0.5

## 2011-06-26 MED ORDER — PHENYLEPHRINE HCL 10 MG/ML IJ SOLN
INTRAMUSCULAR | Status: DC | PRN
  Administered 2011-06-26: 80 ug via INTRAVENOUS

## 2011-06-26 MED ORDER — ACETAMINOPHEN 325 MG PO TABS: 325.0000 mg | ORAL_TABLET | ORAL | Status: DC | PRN

## 2011-06-26 SURGICAL SUPPLY — 28 items
BRR ADH 6X5 SEPRAFILM 1 SHT (MISCELLANEOUS)
CHLORAPREP W/TINT 26ML (MISCELLANEOUS) ×3 IMPLANT
CONTAINER PREFILL 10% NBF 15ML (MISCELLANEOUS) IMPLANT
DRESSING TELFA 8X3 (GAUZE/BANDAGES/DRESSINGS) ×1 IMPLANT
ELECT REM PT RETURN 9FT ADLT (ELECTROSURGICAL) ×2
ELECTRODE REM PT RTRN 9FT ADLT (ELECTROSURGICAL) ×1 IMPLANT
EXTRACTOR VACUUM M CUP 4 TUBE (SUCTIONS) IMPLANT
GAUZE SPONGE 4X4 12PLY STRL LF (GAUZE/BANDAGES/DRESSINGS) ×2 IMPLANT
GLOVE BIOGEL PI IND STRL 6.5 (GLOVE) ×2 IMPLANT
GLOVE BIOGEL PI INDICATOR 6.5 (GLOVE) ×2
GLOVE SURG SS PI 6.0 STRL IVOR (GLOVE) ×2 IMPLANT
GOWN PREVENTION PLUS LG XLONG (DISPOSABLE) ×5 IMPLANT
KIT ABG SYR 3ML LUER SLIP (SYRINGE) ×2 IMPLANT
NDL HYPO 25X5/8 SAFETYGLIDE (NEEDLE) IMPLANT
NEEDLE HYPO 25X5/8 SAFETYGLIDE (NEEDLE) IMPLANT
NS IRRIG 1000ML POUR BTL (IV SOLUTION) ×2 IMPLANT
PACK C SECTION WH (CUSTOM PROCEDURE TRAY) ×2 IMPLANT
PAD ABD 7.5X8 STRL (GAUZE/BANDAGES/DRESSINGS) ×1 IMPLANT
RTRCTR C-SECT PINK 25CM LRG (MISCELLANEOUS) ×1 IMPLANT
SEPRAFILM MEMBRANE 5X6 (MISCELLANEOUS) IMPLANT
SLEEVE SCD COMPRESS KNEE MED (MISCELLANEOUS) IMPLANT
STAPLER VISISTAT 35W (STAPLE) ×2 IMPLANT
SUT PLAIN 0 NONE (SUTURE) IMPLANT
SUT VIC AB 0 CT1 36 (SUTURE) ×8 IMPLANT
TAPE CLOTH SURG 4X10 WHT LF (GAUZE/BANDAGES/DRESSINGS) ×1 IMPLANT
TOWEL OR 17X24 6PK STRL BLUE (TOWEL DISPOSABLE) ×4 IMPLANT
TRAY FOLEY CATH 14FR (SET/KITS/TRAYS/PACK) ×2 IMPLANT
WATER STERILE IRR 1000ML POUR (IV SOLUTION) ×2 IMPLANT

## 2011-06-26 NOTE — Progress Notes (Signed)
Patient ID: Marilyn Wilkinson, female   DOB: 16-Jul-1980, 31 y.o.   MRN: [id number] Following coordination of transfer of care to [facility], Dr. Otho Perl (MFM attending) expressed his concerns regarding the patient not being monitored in route to [facility]. Discussed with patient risk of decompensation of twin B during transport. Patient verbalized understanding. Following discussion with her family members, patient agreed to stay for delivery and also understands that there is a possibility that the babies may be transferred to another facility. Patient verbalized understanding. Risk of cesarean section reviewed including but not limited to risk of bleeding, infection and damage to adjacent organs. Patient verbalized understanding and all questions were answered.

## 2011-06-26 NOTE — Transfer of Care (Signed)
Immediate Anesthesia Transfer of Care Note  Patient: Marilyn Wilkinson  Procedure(s) Performed: Procedure(s) (LRB): CESAREAN SECTION (N/A)  Patient Location: PACU  Anesthesia Type: Spinal  Level of Consciousness: awake  Airway & Oxygen Therapy: Patient Spontanous Breathing  Post-op Assessment: Report given to PACU RN  Post vital signs: Reviewed and stable  Complications: No apparent anesthesia complications

## 2011-06-26 NOTE — Progress Notes (Signed)
27 5/[redacted]  weeks gestation, with twins and elevated BP.  Height  66" Weight 257 Lbs pre-pregnancy weight 223 Lbs.Pre-pregnancy  BMI 36 ( class II obesity)  IBW 130 Lbs  Total weight gain 34 Lbs. Weight gain goals 24-42 Lbs.   Estimated needs: 25-2700 kcal/day, 90-110 grams protein/day, 3 liters fluid/day Regular diet has been  tolerated well, appetite good. Currently NPO, c/s pending for 4 pm today Antenatal regular diet prescription will provide for increased needs. No abnormal nutrition related labs  Nutrition Dx: Increased nutrient needs r/t pregnancy and fetal growth requirements aeb 27-28 weeks twin gestation.  No educational needs assessed at this time.

## 2011-06-26 NOTE — Progress Notes (Signed)
MD to come and talk to pt about POC

## 2011-06-26 NOTE — Anesthesia Postprocedure Evaluation (Signed)
Anesthesia Post Note  Patient: Marilyn Wilkinson  Procedure(s) Performed: Procedure(s) (LRB): CESAREAN SECTION (N/A)  Anesthesia type: Spinal  Patient location: PACU  Post pain: Pain level controlled  Post assessment: Post-op Vital signs reviewed  Last Vitals:  Filed Vitals:   06/26/11 1849  BP: 149/78  Pulse: 84  Temp: 36.5 C  Resp: 18    Post vital signs: Reviewed  Level of consciousness: awake  Complications: No apparent anesthesia complications

## 2011-06-26 NOTE — Anesthesia Procedure Notes (Signed)
Spinal  Patient location during procedure: OR Start time: 06/26/2011 4:34 PM End time: 06/26/2011 4:36 PM Staffing Anesthesiologist: Brayton Caves R Performed by: anesthesiologist  Preanesthetic Checklist Completed: patient identified, site marked, surgical consent, pre-op evaluation, timeout performed, IV checked, risks and benefits discussed and monitors and equipment checked Spinal Block Patient position: sitting Prep: DuraPrep Patient monitoring: continuous pulse ox, blood pressure and heart rate Approach: midline Location: L3-4 Injection technique: single-shot Needle Needle type: Sprotte  Needle gauge: 24 G Needle length: 9 cm Assessment Sensory level: T4 Additional Notes Performed with Wyline Beady, SRNA

## 2011-06-26 NOTE — Progress Notes (Signed)
06/26/11 1600  Clinical Encounter Type  Visited With Patient and family together (pt's husband, mom, aunt, MGM; husband's bro Ricky.)  Visit Type Follow-up;Spiritual support;Social support  Spiritual Encounters  Spiritual Needs Prayer;Emotional  Stress Factors  Family Stress Factors (Preparing for c-section.)    Provided spiritual and emotional encouragement, and prayer at bedside for Marilyn Wilkinson, Marilyn Wilkinson, and whole medical team with circle of strong women (Leon's mother, aunt, MGM).    Will apprise on-call chaplains of situation and refer to Chaplain Dyanne Carrel for follow-up care tomorrow.  [address] [place], [place] [phone]

## 2011-06-26 NOTE — Progress Notes (Signed)
Ms. Pence was seen for ultrasound appointment today.  Please see [facility]-OBGYN report for details.

## 2011-06-26 NOTE — Progress Notes (Signed)
Patient ID: Marilyn Wilkinson, female   DOB: [birth year], 31 y.o.   MRN: [id number] Following decision to deliver, I was informed by the NICU personnel that our neonatal unit was full to capacity and could not safely accommodate 27 weeks twins. Patient was informed of the need for transfer an requested transfer to [facility]. [facility] was contacted but their NICU was closed and not accepting any transfers. [facility] was contacted and transfer was accepted by Dr. Doreatha Martin. Patient was made aware of her transfer to [facility]. All questions were answered. Patient verbalized understanding.  NST remains non-reactive but appropriate for gestational age with baby B having had a 1 minute variable decel at 1400 with spontaneous recovery and return to baseline since. Will continue monitoring until ready for transfer. Continue magnesium sulfate for neuro protection during transport.

## 2011-06-26 NOTE — Progress Notes (Signed)
06/26/11 1200  Clinical Encounter Type  Visited With Patient and family together;Health care provider (Husband Marilyn Wilkinson; present with pt permission with Dr Eric Form)  Visit Type Follow-up;Spiritual support;Social support (Per pt request for chaplain support)  Referral From Patient  Recommendations Spiritual Care will follow as closely as possible for sp/emo support  Spiritual Encounters  Spiritual Needs Emotional;Prayer  Stress Factors  Family Stress Factors Loss of control;Major life changes;Health changes (Emotional rollercoaster; anxiety about sudden, early section)   Cedar and Marilyn Wilkinson requested my support as they processed the news that they either may have to let their smaller baby go or have an immediate c-section to try to protect both of them.  Marilyn Wilkinson and Marilyn Wilkinson were clear that they could not allow one baby to die while allowing the other baby to stay in longer when they could give them both a chance by having a c-section now.  They were tearful and overwhelmed.  We talked, processed their feelings, prayed together.  With their permission, I was also present when Dr Eric Form visited from NICU to explain NICU's current high demand and question about whether transfer to another hospital would be most prudent for providing NICU care to babies. Offered hugs and compassion as family took in that information; left them to private conversation and reflection, with the promise to check in later today.  Marilyn Wilkinson and Marilyn Wilkinson are aware that they can page the chaplain any time.  Will do my best to be available for support through procedure if c-section does happen here at [facility]; will also refer to evening chaplain for follow-up care.  [address] [place], [place] [phone]

## 2011-06-26 NOTE — Progress Notes (Signed)
Correct telephone handoff report time is 2056.

## 2011-06-26 NOTE — Op Note (Signed)
Marilyn Wilkinson PROCEDURE DATE: 06/17/2011 - 06/26/2011  PREOPERATIVE DIAGNOSIS: Intrauterine pregnancy at  [redacted]w[redacted]d weeks gestation; twin pregnancy with non-reassuring fetal testing for baby B (IUGR and abnormal dopplers)  POSTOPERATIVE DIAGNOSIS: The same  PROCEDURE:     Cesarean Section  SURGEON:  Dr. Catalina Antigua  ASSISTANT: none  INDICATIONS: Marilyn Wilkinson is a 31 y.o. G1P0102 at [redacted]w[redacted]d scheduled for cesarean section secondary to twin pregnancy with abnormal fetal testing for twin B (IUGR with abnormal dopplers).  The risks of cesarean section discussed with the patient included but were not limited to: bleeding which may require transfusion or reoperation; infection which may require antibiotics; injury to bowel, bladder, ureters or other surrounding organs; injury to the fetus; need for additional procedures including hysterectomy in the event of a life-threatening hemorrhage; placental abnormalities wth subsequent pregnancies, incisional problems, thromboembolic phenomenon and other postoperative/anesthesia complications. The patient concurred with the proposed plan, giving informed written consent for the procedure.    FINDINGS:  Baby A- Viable female infant in cephalic presentation.  Apgars 8 and 9, weight, 1 pounds and 15 ounces.  Clear amniotic fluid.  Baby B- Viable female infant in breech presentation. Apgars 7 And 7, weight 1 pounds and 6 ounces. Clear fluid. Intact placenta, three vessel cord.  Normal uterus, fallopian tubes and ovaries bilaterally.  ANESTHESIA:    Spinal INTRAVENOUS FLUIDS:2000 ml ESTIMATED BLOOD LOSS: 1000 ml URINE OUTPUT: 150 ml SPECIMENS: Placenta sent to pathology COMPLICATIONS: None immediate  PROCEDURE IN DETAIL:  The patient received intravenous antibiotics and had sequential compression devices applied to her lower extremities while in the preoperative area.  She was then taken to the operating room where anesthesia was induced and was found to be adequate.  A foley catheter was placed into her bladder and attached to Marilyn Wilkinson gravity. She was then placed in a dorsal supine position with a leftward tilt, and prepped and draped in a sterile manner. After an adequate timeout was performed, a Pfannenstiel skin incision was made with scalpel and carried through to the underlying layer of fascia. The fascia was incised in the midline and this incision was extended bilaterally using the Mayo scissors. Kocher clamps were applied to the superior aspect of the fascial incision and the underlying rectus muscles were dissected off bluntly. A similar process was carried out on the inferior aspect of the facial incision. The rectus muscles were separated in the midline bluntly and the peritoneum was entered bluntly. Attention was turned to the lower uterine segment where a bladder flap was created, and a transverse hysterotomy was made with a scalpel and extended bilaterally bluntly. The bladder blade was then removed. Baby A was delivered atraumatically from cephalic position, the cord was clamped and cut and the infant handed to the awaiting NICU team. Baby B was delivered from breech position. The cord was clamped and cut and infant were handed over to awaiting neonatology team. Uterine massage was then administered and the placenta delivered intact with three-vessel cord x 2. The uterus was cleared of clot and debris.  The hysterotomy was closed with 0 Vicryl in a running locked fashion, and an imbricating layer was also placed with a 0 Vicryl. Overall, excellent hemostasis was noted. The pelvis was copiously irrigated and cleared of all clot and debris. Hemostasis was confirmed on all surfaces.  The peritoneum and the muscles were reapproximated using 0 vicryl interrupted stitches. The fascia was then closed using 0 Vicryl in a running locked fashion.  The subcutaneous layer  was reapproximated with plain gut and the skin was closed with staples. The patient tolerated the  procedure well. Sponge, lap, instrument and needle counts were correct x 2. She was taken to the recovery room in stable condition.    Marilyn Wilkinson,PEGGYMD  06/26/2011 5:39 PM

## 2011-06-26 NOTE — Consult Note (Signed)
Neonatology Note:   Attendance at C-section:    I was asked to attend this primary C/S at 27 5/7 weeks due to reverse EDF for Twin B. The mother is a G1P0 O pos, GBS pos with chronic HTN with superimposed PIH, twin gestation, and known IUGR of Twin B. She received Betamethasone on 4/29-30. She also received neuroprophylactic magnesium sulfate and a dose of Ancef just prior to delivery. There had been no labor. The indication for the c/s was a change from absent to reverse EDF, although both babies had good BPP. ROM at delivery, fluid clear for both infants.  Twin A delivered vertex and cried spontaneously. She was dried and placed into a portawarmer bag, then the neopuff was applied to support her breathing. She remained pink with good air exchange and was transported to the NICU on the neopuff in good condition. Ap 9/9. Her father accompanied her to the NICU.  Twin B was delivered double footling breech. She was somewhat limp initially, but cried with bulb suctioning and always had a good HR. She was also dried and placed into a portawarmer bag for temp support. We then applied the neopuff. She had some subcostal retractions, but was moving air well and maintaining her HR. A pulse oximeter was placed which showed good O2 saturations. By 5-6 minutes, however, her retractions had worsened and air exchange was decreased, so we suctioned her and intubated her with a 2.5 mm ETT at 11 minutes of life. The CO2 detector turned yellow immediately. We secured the tube at 7 cm at the lips, and good equal breath sounds could be heard. We gave 1.8 ml of Infasurf via the ETT at 14 minutes, which was tolerated well. She was then transported to the NICU, being bagged via the ETT, in good condition. Her father was already in the NICU with Twin A.. Ap 7/7.  I spoke with the mother in the OR about the condition of both infants.    Deatra James, MD

## 2011-06-26 NOTE — Progress Notes (Signed)
Patient ID: Marilyn Wilkinson, female   DOB: 09/20/1980, 31 y.o.   MRN: [id number] FACULTY PRACTICE ANTEPARTUM(COMPREHENSIVE) NOTE  Marilyn Wilkinson is a 31 y.o. G1P0000 at [redacted]w[redacted]d by date of conception who is admitted for IUGR in twin B.   Fetal presentation is cephalic and transverse. Length of Stay:  9  Days  Subjective: Patient is doing relatively well following the results of this morning's ultrasound. Very tearful. Patient reports the fetal movement as active. Patient reports uterine contraction  activity as none. Patient reports  vaginal bleeding as none. Patient describes fluid per vagina as None.  Vitals:  Blood pressure 130/77, pulse 77, temperature 98.4 F (36.9 C), temperature source Oral, resp. rate 20, height 5\' 6"  (1.676 m), weight 116.983 kg (257 lb 14.4 oz), last menstrual period 12/14/2010, SpO2 98.00%. Physical Examination: Fundal Height:  consistent with twins Pelvic Exam:  normal external genitalia, vulva, vagina, cervix, uterus and adnexa, examination not indicated Cervical Exam: Not evaluated. Extremities: Homans sign is negative, no sign of DVT and no edema, redness or tenderness in the calves or thighs with DTRs 2+ bilaterally Membranes:intact  Fetal Monitoring:  Baseline: 160 bpm, Variability: Good {> 6 bpm), Accelerations: Non-reactive but appropriate for gestational age, Decelerations: Absent and   Labs:  No results found for this or any previous visit (from the past 24 hour(s)).  Imaging Studies:    Currently EPIC will not allow sonographic studies to automatically populate into notes.  In the meantime, copy and paste results into note or free text.  Medications:  Scheduled    . docusate sodium  100 mg Oral Daily  . folic acid  1 mg Oral Daily  . magnesium  4 g Intravenous Once  . metFORMIN  500 mg Oral BID WC  . prenatal multivitamin  1 tablet Oral Daily  . progesterone  200 mg Vaginal Daily  . sodium chloride  3 mL Intravenous Q12H   I have reviewed the  patient's current medications.  ASSESSMENT: Patient Active Problem List  Diagnoses  . PCOS (polycystic ovarian syndrome)  . Dichorionic diamniotic twin pregnancy  .  Chronic hypertension antepartum  . Chronic hypertension  . Twin B with absent end diastolic flow on UA dopplers, intermittent reverse flow  . Twin B with IUGR    PLAN: 31 yo G1P0 with Twin IUP at [redacted]w[redacted]d with non-reassuring fetal testing in twin B - Twin B was found to have persistent reverse end diastolic flow and BPP 6/8. MFM recommended delivery today - Patient desires to consult with NICU prior to delivery on the probability of survival of twin B. Patient considering sacrificing twin B to prolong the gestation of twin A pending NICU's prognosis of twin B. - Will keep NPO for now and start Magnesium sulfate for CP prophylaxis - Delivery planned for 4 pm via primary c-section due to the patient's po intake at 8 am. Delivery not considered emergent by MFM.  Sharman Garrott 06/26/2011,11:00 AM

## 2011-06-26 NOTE — Progress Notes (Signed)
Dr. Eric Form NICU called to come and talk to pt. It will be after lunch or if imminent delivery call other NICU ext to come and talk and answer pt questions.

## 2011-06-26 NOTE — Progress Notes (Signed)
Correct hand-off report time is 2043

## 2011-06-26 NOTE — Progress Notes (Signed)
Received pt back to room and pt very tearful about the probability of delivering today.  Pt requesting NICU to come and talk to her.

## 2011-06-26 NOTE — Progress Notes (Signed)
06/26/11 1515  Clinical Encounter Type  Visited With Patient and family together;Health care provider (Pt, FOB, pt's mother/aunt/MGM, Rinaldo Cloud RN, Dr Jolayne Panther)  Visit Type Follow-up;Spiritual support (Complex plan)  Recommendations Spiritual Care will follow as closely as possible.  Stress Factors  Family Stress Factors Loss of control;Major life changes (Changing plans about surgery, location, NICU availability.)    Was present with family (listed above) when Dr Jolayne Panther brought news about possibility of not going to [facility] due to Maternal Fetal Care concerns about babies' safety/lack of monitoring during transport.  Provided pastoral presence, listening, encouragement to ask questions, then left to give family privacy to process new information and to discern together.    If c-section happens here at [facility], I am available to be a prayerful/supportive presence in OR if family desires and OR team allows.  Thank you for working hard to support pt through all of these changes!  [address] [place], [place] [phone]

## 2011-06-27 ENCOUNTER — Inpatient Hospital Stay (HOSPITAL_COMMUNITY): Payer: BC Managed Care – PPO

## 2011-06-27 LAB — CBC
HCT: 30.9 % — ABNORMAL LOW (ref 36.0–46.0)
Hemoglobin: 9.8 g/dL — ABNORMAL LOW (ref 12.0–15.0)
MCH: 24.4 pg — ABNORMAL LOW (ref 26.0–34.0)
MCHC: 31.7 g/dL (ref 30.0–36.0)
MCV: 77.1 fL — ABNORMAL LOW (ref 78.0–100.0)
Platelets: 221 10*3/uL (ref 150–400)
RBC: 4.01 MIL/uL (ref 3.87–5.11)
RDW: 17.5 % — ABNORMAL HIGH (ref 11.5–15.5)
WBC: 13.5 10*3/uL — ABNORMAL HIGH (ref 4.0–10.5)

## 2011-06-27 NOTE — Progress Notes (Signed)
I was referred to pt by chaplain, Rush Barer, who saw them yesterday before delivery.  Jana Half and Luisa Hart (FOB) are in good spirits today and report that the babies are doing well.  We shared a prayer of thanks for their babies.  They are grateful for all of the support they received from [facility] and other staff yesterday.  We will continue to follow them in the NICU.  Please page as needs arise or as pt requests, [phone].  Agnes Lawrence Ranisha Allaire 12:22 PM  06/27/11 1200  Clinical Encounter Type  Visited With Family;Patient and family together  Visit Type Follow-up  Referral From Chaplain  Spiritual Encounters  Spiritual Needs Prayer

## 2011-06-27 NOTE — Progress Notes (Signed)
Subjective: Postpartum Day 1: Cesarean Delivery Patient reports incisional pain when ambulating. States she would like to have IUD for contraception. Plans to breast feed. Has not voided yet.   Objective: Vital signs in last 24 hours: Temp:  [97.7 F (36.5 C)-98.9 F (37.2 C)] 98.1 F (36.7 C) (05/17 0500) Pulse Rate:  [77-99] 88  (05/17 0630) Resp:  [16-24] 18  (05/17 0500) BP: (122-154)/(66-106) 135/87 mmHg (05/17 0630) SpO2:  [96 %-100 %] 98 % (05/17 0500)  Physical Exam:  General: alert and cooperative Lochia: appropriate Uterine Fundus: could not appreciate d/t body habitus. Incision: well bandaged with no visible leakage  DVT Evaluation: No evidence of DVT seen on physical exam.   Basename 06/27/11 0505 06/26/11 1345  HGB 9.8* 11.4*  HCT 30.9* 35.8*    Assessment/Plan: Status post Cesarean section. Doing well postoperatively.  Continue current care.  Deston Bilyeu 06/27/2011, 8:07 AM

## 2011-06-27 NOTE — Progress Notes (Signed)
See Dr Ellin Saba note.  Marilyn Wilkinson 06/27/2011 2:15 PM

## 2011-06-27 NOTE — Progress Notes (Signed)
Subjective: Postpartum Day 1 Cesarean Delivery Patient reports tolerating PO and + flatus, ambulating.    Objective: Vital signs in last 24 hours: Temp:  [97.7 F (36.5 C)-98.9 F (37.2 C)] 98.1 F (36.7 C) (05/17 0500) Pulse Rate:  [77-99] 88  (05/17 0630) Resp:  [16-24] 18  (05/17 0500) BP: (122-154)/(66-106) 135/87 mmHg (05/17 0630) SpO2:  [96 %-100 %] 98 % (05/17 0500)  Physical Exam:  General: alert Lochia: appropriate Uterine Fundus: firm Abd: soft, NT, NABS Incision: dressing c/d/i DVT Evaluation: No evidence of DVT seen on physical exam.   Basename 06/27/11 0505 06/26/11 1345  HGB 9.8* 11.4*  HCT 30.9* 35.8*    Assessment/Plan: Status post Cesarean section. Doing well postoperatively.  Continue current care.  Ibraheem Voris C. 06/27/2011, 7:11 AM

## 2011-06-27 NOTE — Progress Notes (Signed)
Visited with Marilyn Wilkinson and she is doing well.  She had no concerns about her postpartum course and no questions.  Will continue routine postpartum care.  Jaynie Collins, M.D. 06/27/2011 3:22 PM

## 2011-06-27 NOTE — Anesthesia Postprocedure Evaluation (Signed)
  Anesthesia Post-op Note  Patient: Marilyn Wilkinson  Procedure(s) Performed: Procedure(s) (LRB): CESAREAN SECTION (N/A)  Patient Location: [facility]  Anesthesia Type: Spinal  Level of Consciousness: awake  Airway and Oxygen Therapy: Patient Spontanous Breathing  Post-op Pain: mild  Post-op Assessment: Patient's Cardiovascular Status Stable and Respiratory Function Stable  Post-op Vital Signs: stable  Complications: No apparent anesthesia complications

## 2011-06-27 NOTE — Addendum Note (Signed)
Addendum  created 06/27/11 7253 by Renford Dills, CRNA   Modules edited:Notes Section

## 2011-06-27 NOTE — Progress Notes (Signed)
UR Chart review completed.  

## 2011-06-28 ENCOUNTER — Encounter (HOSPITAL_COMMUNITY): Payer: Self-pay | Admitting: Obstetrics and Gynecology

## 2011-06-28 NOTE — Progress Notes (Signed)
Subjective: Postpartum Day 2: Cesarean Delivery Patient reports incisional pain, tolerating PO and no problems voiding.    Objective: Vital signs in last 24 hours: Temp:  [97.6 F (36.4 C)-98.5 F (36.9 C)] 97.6 F (36.4 C) (05/18 0559) Pulse Rate:  [88-103] 91  (05/18 0559) Resp:  [18] 18  (05/18 0559) BP: (123-143)/(72-91) 133/91 mmHg (05/18 0559) SpO2:  [96 %-98 %] 98 % (05/18 0559) Weight:  [116.983 kg (257 lb 14.4 oz)] 116.983 kg (257 lb 14.4 oz) (05/17 1045)  Physical Exam:  General: alert, cooperative and no distress Lochia: appropriate Uterine Fundus: firm Incision: healing well DVT Evaluation: No evidence of DVT seen on physical exam.   Basename 06/27/11 0505 06/26/11 1345  HGB 9.8* 11.4*  HCT 30.9* 35.8*    Assessment/Plan: Status post Cesarean section. Doing well postoperatively.  Continue current care.  Lanyla Costello 06/28/2011, 8:42 AM

## 2011-06-28 NOTE — Progress Notes (Signed)
Have tried to awaken pt and motivate her to pump her breast several times throughout shift.  Last time pt pumped was around 2300.  Pt states she is not sure pumping her breast is what she really wants to do.  Tried to encourage the pt to pump and she stated she would when she felt up to it.

## 2011-06-28 NOTE — Progress Notes (Signed)
PSYCHOSOCIAL ASSESSMENT ~ MATERNAL/CHILD Name: Marilyn Wilkinson         Age: 31 years    Referral Date: [id number]   Reason/Source: Twin NICU admission  I. FAMILY/HOME ENVIRONMENT A. Child's Legal Guardian Parent(s)    Name:  Kaidan and Roshaunda Starkey   Address:  [address], [place] [address]  B. Other Household Members/Support Persons Name:  Maternal grandmother                  Name:  Maternal great grandmother               Name:  Paternal grandmother                   Name:  Paternal great grandmother C.   Other Support:  Extended family and friends  II. PSYCHOSOCIAL DATA A. Information Source X Patient Interview  X Family Interview            B. 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 Cultural and Environment Information/Cultural Issues Impacting Care: N/A III. STRENGTHS X Supportive family/friends   X Adequate Resources  X Compliance with medical plan  X Home prepared for Child (including basic supplies)                 X Understanding of illness            IV. RISK FACTORS AND CURRENT PROBLEMS        X  27 week NICU twins            V. SOCIAL WORK ASSESSMENT Met with MOB and FOB at bedside for support related to recent NICU admission of their newborn twin girls.  Parents have been married for 4 years, and this experience has brought them closer together.  This is their first pregnancy and both are overjoyed with the birth of their baby girls.  MOB had a difficult pregnancy course complicated by an Antenatal admission for a couple of weeks and subsequent early delivery of her babies.  Parents have acknowledged the ups and downs of going through this experience.  They feel supported and spoke of their strength, faith, and coping mechanisms to carry them through this time in their lives.   Parents  acknowledge the uncertainties felt with having babies in the NICU and are open to talking about their feelings and seeking support when needed.  I discussed the additional spiritual services available to parents if needed, and parents took comfort in that.  MOB was very interested in parent support and I discussed the [employer] and their role in helping families grapple with the complexities of having a baby in the NICU.  I also discussed the variety of programs and services the NICU offers to engage families who have babies in the NICU.  Parents were pleasant, communicative, engaging, and appreciative of information and support.  I discussed the SSI application started by weekday SW, and MOB expressed desire in moving forward with application.  Parents are appropriately sad about not having their babies come home with them, and were encouraged to continue to communicate those feelings and seek out support as needed.  Parents report their families and friends have been coming to visit and support them at this time.  They plan to  put together the twins nursery while they are still in the NICU.  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  MOB is on maternity leave from her job of 6.5 years.  She is optimistic about what the future may hold for her twins, and is open to pursuing public support programs that may be helpful for her growing family.  She is slated to return to work for some time in mid-July and perhaps take additional leave until the end of September as she adjusts to the baby's being home.  She is considering her options at this time, as she recognizes the challenges of going from a family of two to four overnight.  Parents are supportive of one another and plan to visit regularly with their babies during their NICU stay.     VI. SOCIAL WORK PLAN X No Further Intervention Required/No Barriers to Discharge X Patient/Family Education:  NICU brochure  Staci Acosta, MSW LCSW, 06/28/2011, 1:47 pm

## 2011-06-29 MED ORDER — BISACODYL 10 MG RE SUPP: 10.0000 mg | Freq: Every day | RECTAL | Status: DC | PRN

## 2011-06-29 MED ORDER — IBUPROFEN 600 MG PO TABS: 600.0000 mg | ORAL_TABLET | Freq: Four times a day (QID) | ORAL | Status: AC

## 2011-06-29 MED ORDER — OXYCODONE-ACETAMINOPHEN 5-325 MG PO TABS: 1.0000 | ORAL_TABLET | ORAL | Status: AC | PRN

## 2011-06-29 NOTE — Progress Notes (Signed)
Staples removed from LT incision.  Staples replaced with 1/2" steri-strips.  Patient tolerated well.  Incision well approximated without drainage.

## 2011-06-29 NOTE — Progress Notes (Signed)
Discharge instructions reviewed with patient.  Patient states understanding of instructions and signs and symptoms to report to MD.  No home equipment needed.  Discharged home with family.  Ambulated to car with staff without incident.

## 2011-06-29 NOTE — Discharge Summary (Signed)
Obstetric Discharge Summary Reason for Admission: observation/evaluation and twins, abnormal fetal growth Prenatal Procedures: ultrasound Intrapartum Procedures: cesarean: low cervical, transverse Postpartum Procedures: none Complications-Operative and Postpartum: none Hemoglobin  Date Value Range Status  06/27/2011 9.8* 12.0-15.0 (g/dL) Final     HCT  Date Value Range Status  06/27/2011 30.9* 36.0-46.0 (%) Final    Physical Exam:  General: alert, cooperative and no distress Lochia: appropriate Uterine Fundus: firm Incision: healing well DVT Evaluation: No evidence of DVT seen on physical exam.  Discharge Diagnoses: premature twin delivery  Discharge Information: Date: 06/29/2011 Activity: pelvic rest and no heavy lifting Diet: routine Medications: Ibuprofen and Percocet Condition: stable Instructions: refer to practice specific booklet Discharge to: home Follow-up Information    Follow up with [facility] in 4 weeks.       Wants Mirena  Newborn Data:   Skyelynn, Rambeau [[id number]]  Live born female  Birth Weight: 1 lb 15 oz (880 g) APGAR: 8, 9   Soren, Pigman [[id number]]  Live born female  Birth Weight: 1 lb 6.2 oz (630 g) APGAR: 7, 7  NICU  Caiya Bettes 06/29/2011, 7:47 AM

## 2011-06-29 NOTE — Discharge Instructions (Signed)
Breast Pumping Tips Pumping your breast milk is a good way to stimulate milk production and have a steady supply of breast milk for your infant. Pumping is most helpful during your infant's growth spurts, when involving dad or a family member, or when you are away. There are several types of pumps available. They can be purchased at a baby or maternity store. You can begin pumping soon after delivery, but some experts believe that you should wait about four weeks to give your infant a bottle. In general, the more you breastfeed or pump, the more milk you will have for your infant. It is also important to take good care of yourself. This will reduce stress and help your body to create a healthy supply of milk. Your caregiver or lactation consultant can give you the information and support you need in your efforts to breastfeed your infant. PUMPING BREAST MILK  Follow the tips below for successful breast pumping. Take care of yourself.  Drink enough water or fluids to keep urine clear or pale yellow. You may notice a thirsty feeling while breastfeeding. This is because your body needs more water to make breast milk. Keep a large water bottle handy. Make healthy drink choices such as unsweetened fruit juice, milk and water. Limit soda, coffee, and alcohol (wait 2 hours to feed or pump if you have an alcoholic drink.)   Eat a healthy, well-balanced diet rich in fruits, vegetables, and whole grains.   Exercise as recommended by your caregiver.   Get plenty of sleep. Sleep when your infant sleeps. Ask friends and family for help if you need time to nap or rest.   Do not smoke. Smoking can lower your milk supply and harm your infant. If you need help quitting, ask your caregiver for a program recommendation.   Ask your caregiver about birth control options. Birth control pills may lower your milk supply. You may be advised to use condoms or other forms of birth control.  Relax and pump Stimulating your  let-down reflex is the key to successful and effective pumping. This makes the milk in all parts of the breast flow more freely.   It is easier to pump breast milk (and breastfeed) while you are relaxed. Find techniques that work for you. Quiet private spaces, breast massage, soothing heat placed on the breast, music, and pictures or a tape recording of your infant may help you to relax and "let down" your milk. If you have difficulty with your let down, try smelling one of your infant's blankets or an item of clothing he or she has worn while you are pumping.   When pumping, place the special suction cup (flange) directly over the nipple. It may be uncomfortable and cause nipple damage if it is not placed properly or is the wrong size. Applying a small amount of purified or modified lanolin to your nipple and the areola may help increase your comfort level. Also, you can change the speed and suction of many electric pumps to your comfort level. Your caregiver or lactation consultant can help you with this.   If pumping continues to be painful, or you feel you are not getting very much milk when you pump, you may need a different type of pump. A lactation consultant can help you determine if this is the case.   If you are with your infant, feed him or her on demand and try pumping after each feeding. This will boost your production, even if milk   does not come out. You may not be able to pump much milk at first, but keep up the routine, and this will change.   If you are working or away from your infant for several hours, try pumping for about 15 minutes every 2 to 3 hours. Pump both breasts at the same time if you can.   If your infant has a formula feeding, make sure you pump your milk around the same time to maintain your supply.   Begin pumping breast milk a few weeks before you return to work. This will help you develop techniques that work for you and will be able to store extra milk.   Find a  source of breastfeeding information that works well for you.  TIPS FOR STORING BREAST MILK  Store breast milk in a sealable sterile bag, jar, or container provided with your pumping supplies.   Store milk in small amounts close to what your infant is drinking at each feeding.   Cool pumped milk in a refrigerator or cooler. Pumped milk can last at the back of the refrigerator for 3 to 8 days.   Place cooled milk at the back of the freezer for up to 3 months.   Thaw the milk in its container or bag in warm water up to 24 hours in advance. Do not use a microwave to thaw or heat milk. Do not refreeze the milk after it has been thawed.   Breast milk is safe to drink when left at room temperature (mid 70s or colder) for 4 to 8 hours. After that, throw it away.   Milk fat can separate and look funny. The color can vary slightly from day to day. This is normal. Always shake the milk before using it to mix the fat with the more watery portion.  SEEK MEDICAL CARE IF:   You are having trouble pumping or feeding your infant.   You are concerned that you are not making enough milk.   You have nipple pain, soreness, or redness.   You have other questions or concerns related to you or your infant.  Document Released: 07/17/2009 Document Revised: 01/16/2011 Document Reviewed: 07/17/2009 ExitCare Patient Information 2012 [employer]. 

## 2011-07-16 ENCOUNTER — Encounter: Payer: Self-pay | Admitting: Obstetrics & Gynecology

## 2011-07-16 ENCOUNTER — Ambulatory Visit (INDEPENDENT_AMBULATORY_CARE_PROVIDER_SITE_OTHER): Payer: BC Managed Care – PPO | Admitting: Obstetrics & Gynecology

## 2011-07-16 DIAGNOSIS — O99345 Other mental disorders complicating the puerperium: Secondary | ICD-10-CM

## 2011-07-16 DIAGNOSIS — F53 Postpartum depression: Secondary | ICD-10-CM | POA: Insufficient documentation

## 2011-07-16 DIAGNOSIS — IMO0002 Reserved for concepts with insufficient information to code with codable children: Secondary | ICD-10-CM

## 2011-07-16 NOTE — Patient Instructions (Signed)

## 2011-07-16 NOTE — Progress Notes (Signed)
  Subjective:     Marilyn Wilkinson is a 31 y.o. female who presents for a postpartum visit. She is 3 weeks postpartum following a LTCS done at [redacted]w[redacted]d for twins with abnormal dopplers, one twin with IUGR.  Babies are still in [facility] NICU. I have fully reviewed the prenatal and intrapartum course. The delivery was at [redacted]w[redacted]d gestational weeks.  Anesthesia: spinal. Postpartum course has been uncomplicated. Baby's course has been uncomplicated. Babies are feeding by breast. Bleeding staining only. Bowel function is normal. Bladder function is normal. Patient is not sexually active. Contraception method is none and desires IUD. Postpartum depression screening: positive.  The following portions of the patient's history were reviewed and updated as appropriate: allergies, current medications, past family history, past medical history, past social history, past surgical history and problem list.  Review of Systems Pertinent items are noted in HPI.   Objective:    Breastfeeding? Unknown  General:  alert and no distress   Breasts:  inspection negative, no nipple discharge or bleeding, no masses or nodularity palpable  Lungs: clear to auscultation bilaterally  Heart:  regular rate and rhythm  Abdomen: soft, non-tender; bowel sounds normal; no masses,  no organomegaly. Incision is well-healed, no erythema, no drainage  Pelvic:  deferred        Assessment:    Patient is here for postpartum exam.  Has postpartum depression; babies are in NICU.  One of the tweins needs cardiac surgery to correct VSD, [facility]   Plan:    1. Contraception: IUD. Will return in three weeks for placement.  Handout given. 2. Offered medication or counseling for postpartum depression. Declines medication, wants counseling.  Will refer to psychologist.  Also encouraged to reach out to support groups for other moms with NICU infants. Will continue to evaluate. 3. Follow up in: 3 weeks or as needed.

## 2011-08-07 ENCOUNTER — Ambulatory Visit: Payer: Self-pay | Admitting: Obstetrics & Gynecology

## 2011-08-18 ENCOUNTER — Ambulatory Visit: Payer: Self-pay | Admitting: Obstetrics and Gynecology

## 2012-04-30 ENCOUNTER — Encounter: Payer: Self-pay | Admitting: Obstetrics & Gynecology

## 2012-04-30 ENCOUNTER — Ambulatory Visit (INDEPENDENT_AMBULATORY_CARE_PROVIDER_SITE_OTHER): Payer: Self-pay | Admitting: Obstetrics & Gynecology

## 2012-04-30 VITALS — BP 123/83 | HR 111 | Resp 16 | Wt 236.0 lb

## 2012-04-30 DIAGNOSIS — Z01812 Encounter for preprocedural laboratory examination: Secondary | ICD-10-CM

## 2012-04-30 DIAGNOSIS — Z30430 Encounter for insertion of intrauterine contraceptive device: Secondary | ICD-10-CM

## 2012-04-30 LAB — POCT URINE PREGNANCY: Preg Test, Ur: NEGATIVE

## 2012-04-30 MED ORDER — LEVONORGESTREL 20 MCG/24HR IU IUD
INTRAUTERINE_SYSTEM | Freq: Once | INTRAUTERINE | Status: AC
  Administered 2012-04-30: 1 via INTRAUTERINE

## 2012-04-30 MED ORDER — LEVONORGESTREL 20 MCG/24HR IU IUD: 1.0000 | INTRAUTERINE_SYSTEM | Freq: Once | INTRAUTERINE | Status: DC

## 2012-04-30 NOTE — Progress Notes (Signed)
GYNECOLOGY CLINIC PROCEDURE NOTE  Marilyn Wilkinson is a 32 y.o. [phone] here for Mirena IUD insertion. No GYN concerns.  Last pap smear was on 03/03/2011 and was normal.  IUD Procedure Note Patient identified, informed consent performed.  Discussed risks of irregular bleeding, cramping, infection, malpositioning or misplacement of the IUD outside the uterus which may require further procedures. Time out was performed.  Urine pregnancy test negative.  Speculum placed in the vagina.  Cervix visualized.  Cleaned with Betadine x 2.  Grasped anteriorly with a single tooth tenaculum.  Uterus sounded to 8 cm.  Mirena IUD placed per manufacturer's recommendations.  Strings trimmed to 3 cm. Tenaculum was removed, good hemostasis noted.  Patient tolerated procedure well.   Patient was given post-procedure instructions.  Patient was also asked to check IUD strings periodically and follow up in 4 weeks for IUD check and annual exam.

## 2012-04-30 NOTE — Patient Instructions (Signed)
Intrauterine Device Insertion Care After Refer to this sheet in the next few weeks. These instructions provide you with information on caring for yourself after your procedure. Your caregiver may also give you more specific instructions. Your treatment has been planned according to current medical practices, but problems sometimes occur. Call your caregiver if you have any problems or questions after your procedure. HOME CARE INSTRUCTIONS   Only take over-the-counter or prescription medicines for pain, discomfort, or fever as directed by your caregiver. Do not use aspirin. This may increase bleeding.  Check your IUD to make sure it is in place before you resume sexual activity. You should be able to feel the strings. If you cannot feel the strings, something may be wrong. The IUD may have fallen out of the uterus, or the uterus may have been punctured (perforated) during placement. Also, if the strings are getting longer, it may mean that the IUD is being forced out of the uterus. You no longer have full protection from pregnancy if any of these problems occur.  You may resume sexual intercourse if you are not having problems with the IUD. The IUD is considered immediately effective.  You may resume normal activities.  Keep all follow-up appointments to be sure your IUD has remained in place. After the first exam, yearly exams are advised, unless you cannot feel the strings of your IUD.  Continue to check that the IUD is still in place by feeling for the strings after every menstrual period. SEEK MEDICAL CARE IF:   You have bleeding that is heavier or lasts longer than a normal menstrual cycle.  You have a fever.  You have increasing cramps or abdominal pain not relieved with medicine.  You have abdominal pain that does not seem to be related to the same area of earlier cramping and pain.  You are lightheaded, unusually weak, or faint.  You have abnormal vaginal discharge or  smells.  You have pain during sexual intercourse.  You cannot feel the IUD strings, or the IUD string has gotten longer.  You feel the IUD at the opening of the cervix in the vagina.  You think you are pregnant, or you miss your menstrual period.  The IUD string is hurting your sex partner. Document Released: 09/25/2010 Document Revised: 04/21/2011 Document Reviewed: 09/25/2010 ExitCare Patient Information 2013 [employer].  

## 2013-03-10 ENCOUNTER — Emergency Department: Payer: Self-pay | Admitting: Emergency Medicine

## 2013-07-13 DIAGNOSIS — I10 Essential (primary) hypertension: Secondary | ICD-10-CM | POA: Insufficient documentation

## 2013-07-13 DIAGNOSIS — G43909 Migraine, unspecified, not intractable, without status migrainosus: Secondary | ICD-10-CM | POA: Insufficient documentation

## 2013-07-27 DIAGNOSIS — R221 Localized swelling, mass and lump, neck: Secondary | ICD-10-CM | POA: Insufficient documentation

## 2013-07-28 ENCOUNTER — Ambulatory Visit: Payer: Self-pay | Admitting: Internal Medicine

## 2013-09-02 ENCOUNTER — Ambulatory Visit (INDEPENDENT_AMBULATORY_CARE_PROVIDER_SITE_OTHER): Payer: No Typology Code available for payment source | Admitting: Obstetrics & Gynecology

## 2013-09-02 ENCOUNTER — Encounter: Payer: Self-pay | Admitting: Obstetrics & Gynecology

## 2013-09-02 VITALS — BP 108/81 | HR 75 | Ht 66.0 in | Wt 229.0 lb

## 2013-09-02 DIAGNOSIS — Z3009 Encounter for other general counseling and advice on contraception: Secondary | ICD-10-CM

## 2013-09-02 DIAGNOSIS — Z30432 Encounter for removal of intrauterine contraceptive device: Secondary | ICD-10-CM

## 2013-09-02 NOTE — Progress Notes (Signed)
   CLINIC ENCOUNTER NOTE  History:  33 y.o. [id number] here today for Mirena IUD check, placed 04/30/13. Since then, patient reports weight gain, breast tenderness, hair loss, headaches, pelvic pain. Desires removal of IUD.  Contemplating Paragard, but will use condoms for now. Wants to avoid hormonal contraception.  The following portions of the patient's history were reviewed and updated as appropriate: allergies, current medications, past family history, past medical history, past social history, past surgical history and problem list.  [facility]/2013 [facility]  Review of Systems:  Pertinent items are noted in HPI.  Objective:  Physical Exam BP 108/81  Pulse 75  Ht 5\' 6"  (1.676 m)  Wt 229 lb (103.874 kg)  BMI 36.98 kg/m2  Breastfeeding? No Gen: NAD Abd: Soft, nontender and nondistended Pelvic: Normal appearing external genitalia; normal appearing vaginal mucosa and cervix.  IUD strings visualized. Normal discharge.  Small uterus, no other palpable masses, no uterine or adnexal tenderness  IUD Removal  Written consent obtained. Patient was in the dorsal lithotomy position, normal external genitalia was noted.  A speculum was placed in the patient's vagina, normal discharge was noted, no lesions. The multiparous cervix was visualized, no lesions, no abnormal discharge.  The strings of the IUD were grasped and pulled using ring forceps. The IUD was removed in its entirety.  Patient tolerated the procedure well.     Assessment & Plan:   Patient will use condoms for contraception for now, Paragard information given to patient. Routine preventative health maintenance measures emphasized.    Jaynie CollinsUGONNA  Ardyth Kelso, MD, FACOG Attending Obstetrician & Gynecologist Center for [facility], [facility]

## 2013-09-02 NOTE — Patient Instructions (Signed)
Intrauterine Device Information An intrauterine device (IUD) is inserted into your uterus to prevent pregnancy. There are two types of IUDs available:   Copper IUD--This type of IUD is wrapped in copper wire and is placed inside the uterus. Copper makes the uterus and fallopian tubes produce a fluid that kills sperm. The copper IUD can stay in place for 10 years.  Hormone IUD--This type of IUD contains the hormone progestin (synthetic progesterone). The hormone thickens the cervical mucus and prevents sperm from entering the uterus. It also thins the uterine lining to prevent implantation of a fertilized egg. The hormone can weaken or kill the sperm that get into the uterus. One type of hormone IUD can stay in place for 5 years, and another type can stay in place for 3 years. Your health care provider will make sure you are a good candidate for a contraceptive IUD. Discuss with your health care provider the possible side effects.  ADVANTAGES OF AN INTRAUTERINE DEVICE  IUDs are highly effective, reversible, long acting, and low maintenance.   There are no estrogen-related side effects.   An IUD can be used when breastfeeding.   IUDs are not associated with weight gain.   The copper IUD works immediately after insertion.   The hormone IUD works right away if inserted within 7 days of your period starting. You will need to use a backup method of birth control for 7 days if the hormone IUD is inserted at any other time in your cycle.  The copper IUD does not interfere with your female hormones.   The hormone IUD can make heavy menstrual periods lighter and decrease cramping.   The hormone IUD can be used for 3 or 5 years.   The copper IUD can be used for 10 years. DISADVANTAGES OF AN INTRAUTERINE DEVICE  The hormone IUD can be associated with irregular bleeding patterns.   The copper IUD can make your menstrual flow heavier and more painful.   You may experience cramping and  vaginal bleeding after insertion.  Document Released: 01/01/2004 Document Revised: 09/29/2012 Document Reviewed: 07/18/2012 ExitCare Patient Information 2015 [employer]. This information is not intended to replace advice given to you by your health care provider. Make sure you discuss any questions you have with your health care provider.  

## 2013-12-12 ENCOUNTER — Encounter: Payer: Self-pay | Admitting: Obstetrics & Gynecology

## 2013-12-22 ENCOUNTER — Ambulatory Visit (INDEPENDENT_AMBULATORY_CARE_PROVIDER_SITE_OTHER): Payer: No Typology Code available for payment source | Admitting: Obstetrics & Gynecology

## 2013-12-22 ENCOUNTER — Encounter: Payer: Self-pay | Admitting: Obstetrics & Gynecology

## 2013-12-22 VITALS — BP 136/93 | HR 100 | Ht 66.0 in | Wt 247.4 lb

## 2013-12-22 DIAGNOSIS — D509 Iron deficiency anemia, unspecified: Secondary | ICD-10-CM

## 2013-12-22 DIAGNOSIS — R635 Abnormal weight gain: Secondary | ICD-10-CM

## 2013-12-22 DIAGNOSIS — E282 Polycystic ovarian syndrome: Secondary | ICD-10-CM

## 2013-12-22 MED ORDER — METFORMIN HCL 1000 MG PO TABS: 1000.0000 mg | ORAL_TABLET | Freq: Two times a day (BID) | ORAL | Status: DC

## 2013-12-22 NOTE — Patient Instructions (Signed)
Return to clinic for any scheduled appointments or for any gynecologic concerns as needed.   

## 2013-12-22 NOTE — Progress Notes (Signed)
   CLINIC ENCOUNTER NOTE  History:  33 y.o. [id number] here today for medication consult. She has a history of PCOS and was on Metformin 1000 mg po bid in the past. Over the year; she has battled with excessive weight gain (50 lbs in last 11 months)  despite dieting and exercising and felt that Metformin helped her in the past.    She desires to restart Metformin now for treatment of her PCOS.  Patient was also diagnosed with iron deficiency anemia a few months ago; reports having a hemoglobin of 4? But was just placed on iron.   No other GYN concerns.  Last [facility] in 07/2013 [facility].  The following portions of the patient's history were reviewed and updated as appropriate: allergies, current medications, past family history, past medical history, past social history, past surgical history and problem list.   Review of Systems:  Pertinent items are noted in HPI.  Objective:  BP 136/93 mmHg  Pulse 100  Ht 5\' 6"  (1.676 m)  Wt 247 lb 6.4 oz (112.22 kg)  BMI 39.95 kg/m2 Physical Exam deferred  Assessment & Plan:  TSH, HgA1C, CMET checked today given PCOS and abnormal weight gain.  Will also check CBC, ferritin levels today given report of anemia. will follow up results and manage accordingly.  Metformin 1000 mg po bid prescribed; patient to follow up in about 3 months.  Declined taper method of restarting Metformin; wants to start at full dose as she did this last time and tolerated it well. Routine preventative health maintenance measures emphasized.   Jaynie CollinsUGONNA  Auna Mikkelsen, MD, FACOG Attending Obstetrician & Gynecologist [facility]

## 2013-12-23 LAB — HEMOGLOBIN A1C
Hgb A1c MFr Bld: 5.5 % (ref <5.7)
Mean Plasma Glucose: 111 mg/dL (ref <117)

## 2013-12-23 LAB — COMPREHENSIVE METABOLIC PANEL
ALT: 15 U/L (ref 0–35)
AST: 15 U/L (ref 0–37)
Albumin: 3.7 g/dL (ref 3.5–5.2)
Alkaline Phosphatase: 63 U/L (ref 39–117)
BUN: 11 mg/dL (ref 6–23)
CO2: 24 mEq/L (ref 19–32)
Calcium: 8.8 mg/dL (ref 8.4–10.5)
Chloride: 104 mEq/L (ref 96–112)
Creat: 0.69 mg/dL (ref 0.50–1.10)
Glucose, Bld: 142 mg/dL — ABNORMAL HIGH (ref 70–99)
Potassium: 4.3 mEq/L (ref 3.5–5.3)
Sodium: 139 mEq/L (ref 135–145)
Total Bilirubin: 0.5 mg/dL (ref 0.2–1.2)
Total Protein: 6.2 g/dL (ref 6.0–8.3)

## 2013-12-23 LAB — CBC
HCT: 38.9 % (ref 36.0–46.0)
Hemoglobin: 11.9 g/dL — ABNORMAL LOW (ref 12.0–15.0)
MCH: 23.8 pg — ABNORMAL LOW (ref 26.0–34.0)
MCHC: 30.6 g/dL (ref 30.0–36.0)
MCV: 78 fL (ref 78.0–100.0)
Platelets: 240 10*3/uL (ref 150–400)
RBC: 4.99 MIL/uL (ref 3.87–5.11)
RDW: 15.6 % — ABNORMAL HIGH (ref 11.5–15.5)
WBC: 5.6 10*3/uL (ref 4.0–10.5)

## 2013-12-23 LAB — FERRITIN: Ferritin: 8 ng/mL — ABNORMAL LOW (ref 10–291)

## 2013-12-23 LAB — TSH: TSH: 1.35 u[IU]/mL (ref 0.350–4.500)

## 2015-01-23 DIAGNOSIS — R635 Abnormal weight gain: Secondary | ICD-10-CM | POA: Insufficient documentation

## 2015-01-23 DIAGNOSIS — Z9884 Bariatric surgery status: Secondary | ICD-10-CM | POA: Insufficient documentation

## 2015-02-16 ENCOUNTER — Encounter: Payer: Self-pay | Admitting: Family Medicine

## 2015-10-12 ENCOUNTER — Ambulatory Visit (INDEPENDENT_AMBULATORY_CARE_PROVIDER_SITE_OTHER): Payer: 59 | Admitting: Obstetrics and Gynecology

## 2015-10-12 ENCOUNTER — Encounter: Payer: Self-pay | Admitting: Obstetrics and Gynecology

## 2015-10-12 VITALS — BP 124/87 | HR 97 | Ht 66.0 in | Wt 287.0 lb

## 2015-10-12 DIAGNOSIS — Z01419 Encounter for gynecological examination (general) (routine) without abnormal findings: Secondary | ICD-10-CM | POA: Diagnosis not present

## 2015-10-12 DIAGNOSIS — Z124 Encounter for screening for malignant neoplasm of cervix: Secondary | ICD-10-CM | POA: Diagnosis not present

## 2015-10-12 DIAGNOSIS — Z1151 Encounter for screening for human papillomavirus (HPV): Secondary | ICD-10-CM

## 2015-10-12 NOTE — Addendum Note (Signed)
Addended by: Arne ClevelandHUTCHINSON, Binta Statzer J on: 10/12/2015 09:49 AM   Modules accepted: Orders

## 2015-10-12 NOTE — Progress Notes (Signed)
Patient is a [employer] employee.  Having heavy periods, some fatigue and pain with periods.  She is not interested in birth control.  Had a Mirena in the past with problems.

## 2015-10-12 NOTE — Progress Notes (Signed)
Subjective:     Marilyn Wilkinson is a 35 y.o. female and is here for a comprehensive physical exam. The patient reports inability to lose weight and feeling tired with cycles. She reports monthly cycles with vaginal bleeding for 7 days accompanied by some dysmenorrhea. Patient is not interested in contraception right now as she desires to conceive. She was prescribed iron supplements but has not been taking it. Patient is otherwise doing well and is without complaints  Past Medical History:  Diagnosis Date  . 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 Headache(784.0)    migraines  . Infertility of tubal origin   . Polycystic ovarian syndrome    Past Surgical History:  Procedure Laterality Date  . CESAREAN SECTION  06/26/2011   Procedure: CESAREAN SECTION;  Surgeon: Catalina AntiguaPeggy Khelani Kops, MD;  Location: [facility];  Service: Gynecology;  Laterality: N/A;  . DIAGNOSTIC LAPAROSCOPY  07/24/2010   REMOVAL OF BILATERAL TUBES BLOCKAGE  . GASTRIC BYPASS  2004   Family History  Problem Relation Age of Onset  . Asthma Mother   . Diabetes Mother   . Arthritis Mother   . Fibromyalgia Mother   . Hypertension Father   . Diabetes Father   . Hypertension Maternal Grandmother   . Arthritis Maternal Grandmother     Social History   Social History  . Marital status: Married    Spouse name: N/A  . Number of children: N/A  . Years of education: N/A   Occupational History  . Not on file.   Social History Main Topics  . Smoking status: Never Smoker  . Smokeless tobacco: Never Used  . Alcohol use Yes     Comment: SOCIALLY  . Drug use: No  . Sexual activity: Yes    Birth control/ protection: None   Other Topics Concern  . Not on file   Social History Narrative  . No narrative on file   Health Maintenance  Topic Date Due  . PAP SMEAR  03/02/2014  . INFLUENZA VACCINE  09/11/2015  . TETANUS/TDAP  06/28/2021  . HIV Screening  Completed       Review of Systems Pertinent items  are noted in HPI.   Objective:  Blood pressure 124/87, pulse 97, height 5\' 6"  (1.676 m), weight 287 lb (130.2 kg), last menstrual period 09/11/2015.     GENERAL: Well-developed, well-nourished female in no acute distress.  HEENT: Normocephalic, atraumatic. Sclerae anicteric.  NECK: Supple. Normal thyroid.  LUNGS: Clear to auscultation bilaterally.  HEART: Regular rate and rhythm. BREASTS: Symmetric in size. No palpable masses or lymphadenopathy, skin changes, or nipple drainage. ABDOMEN: Soft, nontender, nondistended. No organomegaly. Obese PELVIC: Normal external female genitalia. Vagina is pink and rugated.  Normal discharge. Normal appearing cervix. Uterus is normal in size. No adnexal mass or tenderness. EXTREMITIES: No cyanosis, clubbing, or edema, 2+ distal pulses.    Assessment:    Healthy female exam.      Plan:    Pap smear collected Will check CBC for anemia and TSH due to inability to lose weight Patient will be contacted with any abnormal results Discussed weight loss strategies- eating lots of fruits and vegetables, baked good not fried and incorporating 150 minutes of exercise weekly See After Visit Summary for Counseling Recommendations

## 2015-10-14 LAB — CBC
Hematocrit: 35.5 % (ref 34.0–46.6)
Hemoglobin: 10.5 g/dL — ABNORMAL LOW (ref 11.1–15.9)
MCH: 19.9 pg — ABNORMAL LOW (ref 26.6–33.0)
MCHC: 29.6 g/dL — ABNORMAL LOW (ref 31.5–35.7)
MCV: 67 fL — ABNORMAL LOW (ref 79–97)
Platelets: 332 10*3/uL (ref 150–379)
RBC: 5.27 x10E6/uL (ref 3.77–5.28)
RDW: 19.3 % — ABNORMAL HIGH (ref 12.3–15.4)
WBC: 7 10*3/uL (ref 3.4–10.8)

## 2015-10-14 LAB — TSH: TSH: 1.32 u[IU]/mL (ref 0.450–4.500)

## 2015-10-17 LAB — PAP IG W/ RFLX HPV ASCU: PAP Smear Comment: 0

## 2016-02-09 ENCOUNTER — Encounter: Payer: Self-pay | Admitting: Emergency Medicine

## 2016-02-09 ENCOUNTER — Emergency Department: Payer: 59

## 2016-02-09 ENCOUNTER — Emergency Department
Admission: EM | Admit: 2016-02-09 | Discharge: 2016-02-09 | Disposition: A | Discharge status: Home / Self Care | Payer: 59 | Attending: Student in an Organized Health Care Education/Training Program | Admitting: Student in an Organized Health Care Education/Training Program

## 2016-02-09 DIAGNOSIS — R05 Cough: Secondary | ICD-10-CM | POA: Diagnosis present

## 2016-02-09 DIAGNOSIS — J069 Acute upper respiratory infection, unspecified: Secondary | ICD-10-CM | POA: Diagnosis not present

## 2016-02-09 LAB — POCT RAPID STREP A: Streptococcus, Group A Screen (Direct): NEGATIVE

## 2016-02-09 MED ORDER — BENZONATATE 100 MG PO CAPS: 200.0000 mg | ORAL_CAPSULE | Freq: Three times a day (TID) | ORAL | 0 refills | Status: AC | PRN

## 2016-02-09 NOTE — ED Notes (Signed)
See triage note   Cough and cold sx's for about 1 week sore throat started about 2-3 days ago  Afebrile on arrival but unsure of fever at home

## 2016-02-09 NOTE — ED Triage Notes (Signed)
Pt reports approx 1 week of cold, cough symptoms. Developed a sore throat on Wednesday. Unsure if she has had a fever at home. Pt talking in full and complete sentences with no difficulty at this time.

## 2016-02-09 NOTE — Discharge Instructions (Signed)
°  You may continue taking DayQuil for your symptoms and ibuprofen or Tylenol as needed for aches, fever, sore throat pain. Tessalon as needed for cough. Increase fluids. Follow-up with your primary care doctor if any continued problems.

## 2016-02-09 NOTE — ED Provider Notes (Signed)
[facility] Emergency Department Provider Note  ____________________________________________   First MD Initiated Contact with Patient 02/09/16 [phone]     (approximate)  I have reviewed the triage vital signs and the nursing notes.   HISTORY  Chief Complaint Sore Throat and Cough   HPI Marilyn Wilkinson is a 35 y.o. female is here with complaint of cough and congestion for 1 week.Patient states that she also developed a sore throat 3 days ago. She is unaware of any fever or chills. She states that she has coughed up blood tinged mucus on several occasions. She denies any previous history of smoking or asthma. She is been taking DayQuil and ibuprofen for her symptoms. Currently she rates her pain is 7 out of 10.   Past Medical History:  Diagnosis Date  . 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 Headache(784.0)    migraines  . Infertility of tubal origin   . Polycystic ovarian syndrome     Patient Active Problem List   Diagnosis Date Noted  . Chronic hypertension 06/02/2011  . PCOS (polycystic ovarian syndrome) 03/03/2011    Past Surgical History:  Procedure Laterality Date  . CESAREAN SECTION  06/26/2011   Procedure: CESAREAN SECTION;  Surgeon: Catalina AntiguaPeggy Constant, MD;  Location: [facility];  Service: Gynecology;  Laterality: N/A;  . DIAGNOSTIC LAPAROSCOPY  07/24/2010   REMOVAL OF BILATERAL TUBES BLOCKAGE  . GASTRIC BYPASS  2004    Prior to Admission medications   Medication Sig Start Date End Date Taking? Authorizing Provider  benzonatate (TESSALON PERLES) 100 MG capsule Take 2 capsules (200 mg total) by mouth 3 (three) times daily as needed. 02/09/16 02/08/17  Tommi Rumpshonda L Kilah Drahos, PA-C  ferrous sulfate 325 (65 FE) MG tablet Take 325 mg by mouth daily with breakfast.    Historical Provider, MD    Allergies Shellfish allergy  Family History  Problem Relation Age of Onset  . Asthma Mother   . Diabetes Mother   . Arthritis Mother   .  Fibromyalgia Mother   . Hypertension Father   . Diabetes Father   . Hypertension Maternal Grandmother   . Arthritis Maternal Grandmother     Social History Social History  Substance Use Topics  . Smoking status: Never Smoker  . Smokeless tobacco: Never Used  . Alcohol use Yes     Comment: SOCIALLY    Review of Systems Constitutional: No fever/chills Eyes: No visual changes. ENT: Positive sore throat. Positive congestion. Cardiovascular: Denies chest pain. Respiratory: Denies shortness of breath. Positive cough. Gastrointestinal: No abdominal pain.  No nausea, no vomiting.   Musculoskeletal: Negative for muscle aches. Skin: Negative for rash. Neurological: Negative for headaches, focal weakness or numbness.  10-point ROS otherwise negative.  ____________________________________________   PHYSICAL EXAM:  VITAL SIGNS: ED Triage Vitals  Enc Vitals Group     BP 02/09/16 0609 (!) 141/86     Pulse Rate 02/09/16 0609 98     Resp 02/09/16 0609 20     Temp 02/09/16 0609 98.4 F (36.9 C)     Temp Source 02/09/16 0609 Oral     SpO2 02/09/16 0609 98 %     Weight 02/09/16 0610 280 lb (127 kg)     Height 02/09/16 0610 5\' 6"  (1.676 m)     Head Circumference --      Peak Flow --      Pain Score 02/09/16 0610 7     Pain Loc --  Pain Edu? --      Excl. in GC? --     Constitutional: Alert and oriented. Well appearing and in no acute distress. Eyes: Conjunctivae are normal. PERRL. EOMI. Head: Atraumatic. Nose: Minimal congestion/rhinnorhea.  EACs and TMs are clear bilaterally. Mouth/Throat: Mucous membranes are moist.  Oropharynx non-erythematous. No exudate. Neck: No stridor.   Hematological/Lymphatic/Immunilogical: No cervical lymphadenopathy. Cardiovascular: Normal rate, regular rhythm. Grossly normal heart sounds.  Good peripheral circulation. Respiratory: Normal respiratory effort.  No retractions. Lungs CTAB. Musculoskeletal: Moves upper and lower extremities  without any difficulty. Normal gait was noted. Neurologic:  Normal speech and language. No gross focal neurologic deficits are appreciated. No gait instability. Skin:  Skin is warm, dry and intact. No rash noted. Psychiatric: Mood and affect are normal. Speech and behavior are normal.  ____________________________________________   LABS (all labs ordered are listed, but only abnormal results are displayed)  Labs Reviewed  CULTURE, GROUP A STREP [facility]  POCT RAPID STREP A   Radiology: Chest x-ray was negative per radiologist. Beaulah CorinI, Vincie Linn L Vanna Shavers, personally viewed and evaluated these images (plain radiographs) as part of my medical decision making, as well as reviewing the written report by the radiologist.   PROCEDURES  Procedure(s) performed: None  Procedures  Critical Care performed: No  ____________________________________________   INITIAL IMPRESSION / ASSESSMENT AND PLAN / ED COURSE  Pertinent labs & imaging results that were available during my care of the patient were reviewed by me and considered in my medical decision making (see chart for details).    Clinical Course    Patient is given a prescription for Tessalon Perles as needed for cough. She is to increase fluids. Tylenol or ibuprofen as needed for muscle aches, fever, sore throat pain. Patient can continue taking DayQuil if needed for congestion symptoms. She is to follow-up with her primary care doctor if any continued problems.  ____________________________________________   FINAL CLINICAL IMPRESSION(S) / ED DIAGNOSES  Final diagnoses:  Acute upper respiratory infection      NEW MEDICATIONS STARTED DURING THIS VISIT:  Discharge Medication List as of 02/09/2016  7:24 AM    START taking these medications   Details  benzonatate (TESSALON PERLES) 100 MG capsule Take 2 capsules (200 mg total) by mouth 3 (three) times daily as needed., Starting Sat 02/09/2016, Until Sun 02/08/2017, Print          Note:  This document was prepared using Dragon voice recognition software and may include unintentional dictation errors.    Tommi Rumpshonda L Alastair Hennes, PA-C 02/09/16 1123    Willy EddyPatrick Robinson, MD 02/09/16 1340

## 2016-02-11 LAB — CULTURE, GROUP A STREP (THRC)

## 2016-11-05 ENCOUNTER — Ambulatory Visit: Payer: 59 | Admitting: Obstetrics and Gynecology

## 2016-11-18 ENCOUNTER — Ambulatory Visit: Payer: 59 | Admitting: Obstetrics & Gynecology

## 2017-04-02 ENCOUNTER — Ambulatory Visit: Payer: 59 | Admitting: Family Medicine

## 2017-05-25 ENCOUNTER — Encounter: Payer: Self-pay | Admitting: Obstetrics and Gynecology

## 2017-05-25 ENCOUNTER — Ambulatory Visit (INDEPENDENT_AMBULATORY_CARE_PROVIDER_SITE_OTHER): Payer: Managed Care, Other (non HMO) | Admitting: Obstetrics and Gynecology

## 2017-05-25 VITALS — BP 129/90 | HR 72 | Wt 300.2 lb

## 2017-05-25 DIAGNOSIS — Z01411 Encounter for gynecological examination (general) (routine) with abnormal findings: Secondary | ICD-10-CM

## 2017-05-25 DIAGNOSIS — Z3201 Encounter for pregnancy test, result positive: Secondary | ICD-10-CM

## 2017-05-25 DIAGNOSIS — Z01419 Encounter for gynecological examination (general) (routine) without abnormal findings: Secondary | ICD-10-CM

## 2017-05-25 DIAGNOSIS — N939 Abnormal uterine and vaginal bleeding, unspecified: Secondary | ICD-10-CM | POA: Diagnosis not present

## 2017-05-25 DIAGNOSIS — O3680X0 Pregnancy with inconclusive fetal viability, not applicable or unspecified: Secondary | ICD-10-CM

## 2017-05-25 DIAGNOSIS — N912 Amenorrhea, unspecified: Secondary | ICD-10-CM

## 2017-05-25 DIAGNOSIS — Z6841 Body Mass Index (BMI) 40.0 and over, adult: Secondary | ICD-10-CM | POA: Insufficient documentation

## 2017-05-25 NOTE — Progress Notes (Signed)
Last pap 10/12/2015- normal

## 2017-05-25 NOTE — Progress Notes (Signed)
Obstetrics and Gynecology Annual Patient Evaluation  Appointment Date: 05/25/2017  OBGYN Clinic: [facility]  Primary Care Provider: Leotis ShamesSingh, Jasmine  Referring Provider: Leotis ShamesSingh, Jasmine, MD  Chief Complaint:  Chief Complaint  Patient presents with  . Gynecologic Exam    History of Present Illness: Marilyn Wilkinson is a 37 y.o. African-American G1P0102 (Patient's last menstrual period was 03/13/2017.), seen for the above chief complaint. Her past medical history is significant for BMI 40s, HTN, PCOS, h/o tubal surgery  Patient with no period since February. Usually they are qmonth, regular, approx 1wk and heavy and no prior episodes of amenorrhea and no prior amenorrhea. Some blood spots with mucus since wednesday, no pain. +breast tenderness.   No nausea, vomiting, abdominal pain, dysuria, hematuria, vaginal itching, dyspareunia, blood in BMs  Review of Systems:  as noted in the History of Present Illness.   Past Medical History:  Past Medical History:  Diagnosis Date  . 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 Headache(784.0)    migraines  . Infertility of tubal origin   . Polycystic ovarian syndrome     Past Surgical History:  Past Surgical History:  Procedure Laterality Date  . CESAREAN SECTION  06/26/2011   Procedure: CESAREAN SECTION;  Surgeon: Catalina AntiguaPeggy Constant, MD;  Location: [facility];  Service: Gynecology;  Laterality: N/A;  . DIAGNOSTIC LAPAROSCOPY  07/24/2010   REMOVAL OF BILATERAL TUBES BLOCKAGE  . GASTRIC BYPASS  2004    Past Obstetrical History:  OB History  Gravida Para Term Preterm AB Living  1 1 0 1 0 2  SAB TAB Ectopic Multiple Live Births  0 0 0 1 2    # Outcome Date GA Lbr Len/2nd Weight Sex Delivery Anes PTL Lv  1A Preterm 06/26/11 [id number]  1 lb 15 oz (0.88 kg) F CS-LTranv Spinal  LIV  1B Preterm 06/26/11 [id number]  1 lb 6.2 oz (0.63 kg) F CS-LTranv Spinal  LIV    Past Gynecological History: As per  HPI. History of Pap Smear(s): Yes.   Last pap 2017, which was NILM She is currently using nothing for contraception.   Social History:  Social History   Socioeconomic History  . Marital status: Married    Spouse name: Not on file  . Number of children: Not on file  . Years of education: Not on file  . Highest education level: Not on file  Occupational History  . Not on file  Social Needs  . Financial resource strain: Not on file  . Food insecurity:    Worry: Not on file    Inability: Not on file  . Transportation needs:    Medical: Not on file    Non-medical: Not on file  Tobacco Use  . Smoking status: Never Smoker  . Smokeless tobacco: Never Used  Substance and Sexual Activity  . Alcohol use: Yes    Comment: SOCIALLY  . Drug use: No  . Sexual activity: Yes    Birth control/protection: None  Lifestyle  . Physical activity:    Days per week: Not on file    Minutes per session: Not on file  . Stress: Not on file  Relationships  . Social connections:    Talks on phone: Not on file    Gets together: Not on file    Attends religious service: Not on file    Active member of club or organization: Not on file    Attends meetings of clubs or  organizations: Not on file    Relationship status: Not on file  . Intimate partner violence:    Fear of current or ex partner: Not on file    Emotionally abused: Not on file    Physically abused: Not on file    Forced sexual activity: Not on file  Other Topics Concern  . Not on file  Social History Narrative  . Not on file    Family History:  Family History  Problem Relation Age of Onset  . Asthma Mother   . Diabetes Mother   . Arthritis Mother   . Fibromyalgia Mother   . Hypertension Father   . Diabetes Father   . Hypertension Maternal Grandmother   . Arthritis Maternal Grandmother     Medications Marilyn Hough had no medications administered during this visit. Current Outpatient Medications  Medication Sig  Dispense Refill  . ferrous sulfate 325 (65 FE) MG tablet Take 325 mg by mouth daily with breakfast.     No current facility-administered medications for this visit.     Allergies Shellfish allergy   Physical Exam:  BP 129/90   Pulse 72   Wt (!) 300 lb 3.2 oz (136.2 kg)   LMP 03/13/2017   BMI 48.45 kg/m  Body mass index is 48.45 kg/m. General appearance: Well nourished, well developed female in no acute distress.  Neck:  Supple, normal appearance, and no thyromegaly  Cardiovascular: normal s1 and s2.  No murmurs, rubs or gallops. Respiratory:  Clear to auscultation bilateral. Normal respiratory effort Abdomen: positive bowel sounds and no masses, hernias; diffusely non tender to palpation, non distended Breasts: breasts appear normal, no suspicious masses, no skin or nipple changes or axillary nodes, and normal palpation. Neuro/Psych:  Normal mood and affect.  Skin:  Warm and dry.  Lymphatic:  No inguinal lymphadenopathy.   Pelvic exam: is not limited by body habitus EGBUS: within normal limits, Vagina: within normal limits and with scant old blood in the vault, no active bleeding Cervix: normal appearing cervix without tenderness, discharge or lesions. Uterus:  nonenlarged and non tender and Adnexa:  normal adnexa and no mass, fullness, tenderness Rectovaginal: deferred  Laboratory: UPT ?faint line  Radiology: no new imaging  Assessment: pt stable  Plan:  1. Encounter for gynecological examination Routine care. Pt amenable to pcp referral - Hemoglobin A1c - Lipid panel - Beta hCG quant (ref lab) - HIV antibody (with reflex) - Hepatitis B Surface AntiGEN - Hepatitis C Antibody - RPR - TSH - CBC - Basic metabolic panel - POCT urine pregnancy - Ambulatory referral to Family Practice  2. Amenorrhea See below. 10 minutes in discussion with patient  3. Abnormal uterine bleeding (AUB) See below  4. Pregnancy of unknown anatomic location ? Faint line. Will get  beta hcg. ER Ectopic precautions given.   Orders Placed This Encounter  Procedures  . Hemoglobin A1c  . Lipid panel  . Beta hCG quant (ref lab)  . HIV antibody (with reflex)  . Hepatitis B Surface AntiGEN  . Hepatitis C Antibody  . RPR  . TSH  . CBC  . Basic metabolic panel  . Ambulatory referral to [facility]  . POCT urine pregnancy    RTC will call with results tomorrow.   Cornelia Copa MD Attending Center for [employer])

## 2017-05-26 ENCOUNTER — Inpatient Hospital Stay (HOSPITAL_COMMUNITY): Payer: Managed Care, Other (non HMO)

## 2017-05-26 ENCOUNTER — Telehealth: Payer: Self-pay | Admitting: Obstetrics and Gynecology

## 2017-05-26 ENCOUNTER — Inpatient Hospital Stay (HOSPITAL_COMMUNITY)
Admission: AD | Admit: 2017-05-26 | Discharge: 2017-05-26 | Disposition: A | Discharge status: Home / Self Care | Payer: Managed Care, Other (non HMO) | Source: Ambulatory Visit | Attending: Obstetrics and Gynecology | Admitting: Obstetrics and Gynecology

## 2017-05-26 ENCOUNTER — Encounter (HOSPITAL_COMMUNITY): Payer: Self-pay | Admitting: *Deleted

## 2017-05-26 ENCOUNTER — Telehealth: Payer: Self-pay | Admitting: *Deleted

## 2017-05-26 ENCOUNTER — Telehealth: Payer: Self-pay | Admitting: Radiology

## 2017-05-26 DIAGNOSIS — Z3A09 9 weeks gestation of pregnancy: Secondary | ICD-10-CM | POA: Diagnosis not present

## 2017-05-26 DIAGNOSIS — O209 Hemorrhage in early pregnancy, unspecified: Secondary | ICD-10-CM | POA: Diagnosis present

## 2017-05-26 DIAGNOSIS — O3680X0 Pregnancy with inconclusive fetal viability, not applicable or unspecified: Secondary | ICD-10-CM

## 2017-05-26 LAB — BASIC METABOLIC PANEL
BUN/Creatinine Ratio: 15 (ref 9–23)
BUN: 10 mg/dL (ref 6–20)
CO2: 22 mmol/L (ref 20–29)
Calcium: 9 mg/dL (ref 8.7–10.2)
Chloride: 104 mmol/L (ref 96–106)
Creatinine, Ser: 0.67 mg/dL (ref 0.57–1.00)
GFR calc Af Amer: 131 mL/min/{1.73_m2} (ref >59)
GFR calc non Af Amer: 113 mL/min/{1.73_m2} (ref >59)
Glucose: 60 mg/dL — ABNORMAL LOW (ref 65–99)
Potassium: 4.4 mmol/L (ref 3.5–5.2)
Sodium: 140 mmol/L (ref 134–144)

## 2017-05-26 LAB — CBC
HCT: 33.3 % — ABNORMAL LOW (ref 36.0–46.0)
Hematocrit: 31.4 % — ABNORMAL LOW (ref 34.0–46.6)
Hemoglobin: 9.3 g/dL — ABNORMAL LOW (ref 11.1–15.9)
Hemoglobin: 9.5 g/dL — ABNORMAL LOW (ref 12.0–15.0)
MCH: 16.9 pg — ABNORMAL LOW (ref 26.6–33.0)
MCH: 17.1 pg — ABNORMAL LOW (ref 26.0–34.0)
MCHC: 29.6 g/dL — ABNORMAL LOW (ref 31.5–35.7)
MCHC: 28.5 g/dL — ABNORMAL LOW (ref 30.0–36.0)
MCV: 57 fL — ABNORMAL LOW (ref 79–97)
MCV: 60 fL — ABNORMAL LOW (ref 78.0–100.0)
Platelets: 397 10*3/uL — ABNORMAL HIGH (ref 150–379)
Platelets: 340 10*3/uL (ref 150–400)
RBC: 5.5 x10E6/uL — ABNORMAL HIGH (ref 3.77–5.28)
RBC: 5.55 MIL/uL — ABNORMAL HIGH (ref 3.87–5.11)
RDW: 21.6 % — ABNORMAL HIGH (ref 12.3–15.4)
RDW: 20.9 % — ABNORMAL HIGH (ref 11.5–15.5)
WBC: 7.8 10*3/uL (ref 3.4–10.8)
WBC: 8.7 10*3/uL (ref 4.0–10.5)

## 2017-05-26 LAB — CMP AND LIVER
ALT: 16 IU/L (ref 0–32)
AST: 15 IU/L (ref 0–40)
Albumin: 4.2 g/dL (ref 3.5–5.5)
Alkaline Phosphatase: 66 IU/L (ref 39–117)
BUN: 11 mg/dL (ref 6–20)
Bilirubin Total: 0.2 mg/dL (ref 0.0–1.2)
Bilirubin, Direct: 0.1 mg/dL (ref 0.00–0.40)
CO2: 22 mmol/L (ref 20–29)
Calcium: 9.2 mg/dL (ref 8.7–10.2)
Chloride: 105 mmol/L (ref 96–106)
Creatinine, Ser: 0.63 mg/dL (ref 0.57–1.00)
GFR calc Af Amer: 133 mL/min/{1.73_m2} (ref >59)
GFR calc non Af Amer: 116 mL/min/{1.73_m2} (ref >59)
Glucose: 63 mg/dL — ABNORMAL LOW (ref 65–99)
Potassium: 4.4 mmol/L (ref 3.5–5.2)
Sodium: 139 mmol/L (ref 134–144)
Total Protein: 7.2 g/dL (ref 6.0–8.5)

## 2017-05-26 LAB — HEPATITIS B SURFACE ANTIGEN: Hepatitis B Surface Ag: NEGATIVE

## 2017-05-26 LAB — LIPID PANEL
Chol/HDL Ratio: 2.2 ratio (ref 0.0–4.4)
Cholesterol, Total: 128 mg/dL (ref 100–199)
HDL: 57 mg/dL (ref >39)
LDL Calculated: 56 mg/dL (ref 0–99)
Triglycerides: 74 mg/dL (ref 0–149)
VLDL Cholesterol Cal: 15 mg/dL (ref 5–40)

## 2017-05-26 LAB — BETA HCG QUANT (REF LAB): hCG Quant: 195 m[IU]/mL

## 2017-05-26 LAB — HEMOGLOBIN A1C
Est. average glucose Bld gHb Est-mCnc: 105 mg/dL
Hgb A1c MFr Bld: 5.3 % (ref 4.8–5.6)

## 2017-05-26 LAB — TSH: TSH: 1.77 u[IU]/mL (ref 0.450–4.500)

## 2017-05-26 LAB — HEPATITIS C ANTIBODY: Hep C Virus Ab: <0.1 s/co ratio (ref 0.0–0.9)

## 2017-05-26 LAB — POCT URINE PREGNANCY
Preg Test, Ur: POSITIVE — AB
Preg Test, Ur: POSITIVE — AB

## 2017-05-26 LAB — RPR: RPR Ser Ql: NONREACTIVE

## 2017-05-26 LAB — HCG, QUANTITATIVE, PREGNANCY: hCG, Beta Chain, Quant, S: 216 m[IU]/mL — ABNORMAL HIGH (ref <5)

## 2017-05-26 LAB — HIV ANTIBODY (ROUTINE TESTING W REFLEX): HIV Screen 4th Generation wRfx: NONREACTIVE

## 2017-05-26 NOTE — Discharge Instructions (Signed)
Ectopic Pregnancy An ectopic pregnancy is when the fertilized egg attaches (implants) outside the uterus. Most ectopic pregnancies occur in one of the tubes where eggs travel from the ovary to the uterus (fallopian tubes), but the implanting can occur in other locations. In rare cases, ectopic pregnancies occur on the ovary, intestine, pelvis, abdomen, or cervix. In an ectopic pregnancy, the fertilized egg does not have the ability to develop into a normal, healthy baby. A ruptured ectopic pregnancy is one in which tearing or bursting of a fallopian tube causes internal bleeding. Often, there is intense lower abdominal pain, and vaginal bleeding sometimes occurs. Having an ectopic pregnancy can be life-threatening. If this dangerous condition is not treated, it can lead to blood loss, shock, or even death. What are the causes? The most common cause of this condition is damage to one of the fallopian tubes. A fallopian tube may be narrowed or blocked, and that keeps the fertilized egg from reaching the uterus. What increases the risk? This condition is more likely to develop in women of childbearing age who have different levels of risk. The levels of risk can be divided into three categories. High risk  You have gone through infertility treatment.  You have had an ectopic pregnancy before.  You have had surgery on the fallopian tubes, or another surgical procedure, such as an abortion.  You have had surgery to have the fallopian tubes tied (tubal ligation).  You have problems or diseases of the fallopian tubes.  You have been exposed to diethylstilbestrol (DES). This medicine was used until 1971, and it had effects on babies whose mothers took the medicine.  You become pregnant while using an IUD (intrauterine device) for birth control. Moderate risk  You have a history of infertility.  You have had an STI (sexually transmitted infection).  You have a history of pelvic inflammatory  disease (PID).  You have scarring from endometriosis.  You have multiple sexual partners.  You smoke. Low risk  You have had pelvic surgery.  You use vaginal douches.  You became sexually active before age 18. What are the signs or symptoms? Common symptoms of this condition include normal pregnancy symptoms, such as missing a period, nausea, tiredness, abdominal pain, breast tenderness, and bleeding. However, ectopic pregnancy will have additional symptoms, such as:  Pain with intercourse.  Irregular vaginal bleeding or spotting.  Cramping or pain on one side or in the lower abdomen.  Fast heartbeat, low blood pressure, and sweating.  Passing out while having a bowel movement.  Symptoms of a ruptured ectopic pregnancy and internal bleeding may include:  Sudden, severe pain in the abdomen and pelvis.  Dizziness, weakness, light-headedness, or fainting.  Pain in the shoulder or neck area.  How is this diagnosed? This condition is diagnosed by:  A pelvic exam to locate pain or a mass in the abdomen.  A pregnancy test. This blood test checks for the presence as well as the specific level of pregnancy hormone in the bloodstream.  Ultrasound. This is performed if a pregnancy test is positive. In this test, a probe is inserted into the vagina. The probe will detect a fetus, possibly in a location other than the uterus.  Taking a sample of uterus tissue (dilation and curettage, or D&C).  Surgery to perform a visual exam of the inside of the abdomen using a thin, lighted tube that has a tiny camera on the end (laparoscope).  Culdocentesis. This procedure involves inserting a needle at the top   of the vagina, behind the uterus. If blood is present in this area, it may indicate that a fallopian tube is torn.  How is this treated? This condition is treated with medicine or surgery. Medicine  An injection of a medicine (methotrexate) may be given to cause the pregnancy tissue  to be absorbed. This medicine may save your fallopian tube. It may be given if: ? The diagnosis is made early, with no signs of active bleeding. ? The fallopian tube has not ruptured. ? You are considered to be a good candidate for the medicine. Usually, pregnancy hormone blood levels are checked after methotrexate treatment. This is to be sure that the medicine is effective. It may take 4-6 weeks for the pregnancy to be absorbed. Most pregnancies will be absorbed by 3 weeks. Surgery  A laparoscope may be used to remove the pregnancy tissue.  If severe internal bleeding occurs, a larger cut (incision) may be made in the lower abdomen (laparotomy) to remove the fetus and placenta. This is done to stop the bleeding.  Part or all of the fallopian tube may be removed (salpingectomy) along with the fetus and placenta. The fallopian tube may also be repaired during the surgery.  In very rare circumstances, removal of the uterus (hysterectomy) may be required.  After surgery, pregnancy hormone testing may be done to be sure that there is no pregnancy tissue left. Whether your treatment is medicine or surgery, you may receive a Rho (D) immune globulin shot to prevent problems with any future pregnancy. This shot may be given if:  You are Rh-negative and the baby's father is Rh-positive.  You are Rh-negative and you do not know the Rh type of the baby's father.  Follow these instructions at home:  Rest and limit your activity after the procedure for as long as told by your health care provider.  Until your health care provider says that it is safe: ? Do not lift anything that is heavier than 10 lb (4.5 kg), or the limit that your health care provider tells you. ? Avoid physical exercise and any movement that requires effort (is strenuous).  To help prevent constipation: ? Eat a healthy diet that includes fruits, vegetables, and whole grains. ? Drink 6-8 glasses of water per day. Get help  right away if:  You develop worsening pain that is not relieved by medicine.  You have: ? A fever or chills. ? Vaginal bleeding. ? Redness and swelling at the incision site. ? Nausea and vomiting.  You feel dizzy or weak.  You feel light-headed or you faint. This information is not intended to replace advice given to you by your health care provider. Make sure you discuss any questions you have with your health care provider. Document Released: 03/06/2004 Document Revised: 09/26/2015 Document Reviewed: 08/29/2015 Elsevier Interactive Patient Education  2018 Elsevier Inc.  

## 2017-05-26 NOTE — Telephone Encounter (Signed)
GYN Telephone Note  Patient called at [phone] but no answer. VM left asking patient to call the office as soon as she can.  Beta hcg is 197. B/c of this and her history of tubal surgery, I recommend that she go to the MAU for another quant and transvaginal u/s today. I will ask the office to try her again later today.   Patient is O positive.  Cornelia Copaharlie Shawnee Gambone, Jr MD Attending [facility] 05/26/2017 Time: [phone]

## 2017-05-26 NOTE — MAU Note (Signed)
PT SAYS  SHE WENT  TO [facility] YESTERDAY.   HAS PCOS- WITH IRREG  CYCLE.   YESTERDAY  WAS REG CHECKUP-   DID LABS.   POSITIVE UPT-  DREW LABS.    NO BIRTH CONTROL.   HAD SPOTTING - WHEN SHE WIPED- STARTED LAST WED.    NO CRAMPS - NO PAIN  . OFFICE CALLED HER - TOLD  HER TO COME HERE .

## 2017-05-26 NOTE — MAU Provider Note (Signed)
History     CSN: [id number]  Arrival date and time: 05/26/17 1834   None     Chief Complaint  Patient presents with  . Vaginal Bleeding   Marilyn Wilkinson is a 37 y.o. G2P0102 at 3797w4d who is here today for HCG and Ultrasound. She was seen in the office for amenorrhea and had +UPT. She has a history of a procedure to clear the tubes, and has had a successful pregnancy since then. She denies any pain or bleeding today.  Vaginal Bleeding  The patient's primary symptoms include vaginal bleeding. The patient's pertinent negatives include no pelvic pain. This is a new problem. The current episode started yesterday. The problem occurs intermittently. The problem has been gradually worsening. The patient is experiencing no pain. She is pregnant. Pertinent negatives include no chills, dysuria, fever, frequency, nausea or vomiting. The vaginal bleeding is lighter than menses. She has not been passing clots. She has not been passing tissue. Nothing aggravates the symptoms. She has tried nothing for the symptoms. Her menstrual history has been regular (LMP 03/13/17 ).   Past Medical History:  Diagnosis Date  . 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 Headache(784.0)    migraines  . Infertility of tubal origin   . Polycystic ovarian syndrome     Past Surgical History:  Procedure Laterality Date  . CESAREAN SECTION  06/26/2011   Procedure: CESAREAN SECTION;  Surgeon: Catalina AntiguaPeggy Constant, MD;  Location: [facility];  Service: Gynecology;  Laterality: N/A;  . DIAGNOSTIC LAPAROSCOPY  07/24/2010   REMOVAL OF BILATERAL TUBES BLOCKAGE  . GASTRIC BYPASS  2004    Family History  Problem Relation Age of Onset  . Asthma Mother   . Diabetes Mother   . Arthritis Mother   . Fibromyalgia Mother   . Hypertension Father   . Diabetes Father   . Hypertension Maternal Grandmother   . Arthritis Maternal Grandmother     Social History   Tobacco Use  . Smoking status: Never Smoker  . Smokeless  tobacco: Never Used  Substance Use Topics  . Alcohol use: Yes    Comment: SOCIALLY  . Drug use: No    Allergies:  Allergies  Allergen Reactions  . Shellfish Allergy Hives    Medications Prior to Admission  Medication Sig Dispense Refill Last Dose  . ferrous sulfate 325 (65 FE) MG tablet Take 325 mg by mouth daily with breakfast.   Taking    Review of Systems  Constitutional: Negative for chills and fever.  Gastrointestinal: Negative for nausea and vomiting.  Genitourinary: Positive for vaginal bleeding. Negative for dysuria, frequency and pelvic pain.   Physical Exam   Blood pressure 128/87, pulse 92, temperature 98.6 F (37 C), temperature source Oral, height 5\' 6"  (1.676 m), weight 299 lb 8 oz (135.9 kg), last menstrual period 03/20/2017.  Physical Exam  Nursing note and vitals reviewed. Constitutional: She is oriented to person, place, and time. She appears well-developed and well-nourished. No distress.  HENT:  Head: Normocephalic.  Cardiovascular: Normal rate.  Respiratory: Effort normal.  GI: Soft. There is no tenderness. There is no rebound.  Neurological: She is alert and oriented to person, place, and time.  Skin: Skin is warm and dry.  Psychiatric: She has a normal mood and affect.     Results for orders placed or performed during the hospital encounter of 05/26/17 (from the past 24 hour(s))  CBC     Status: Abnormal   Collection  Time: 05/26/17  7:01 PM  Result Value Ref Range   WBC 8.7 4.0 - 10.5 K/uL   RBC 5.55 (H) 3.87 - 5.11 MIL/uL   Hemoglobin 9.5 (L) 12.0 - 15.0 g/dL   HCT 96.0 (L) 45.4 - 09.8 %   MCV 60.0 (L) 78.0 - 100.0 fL   MCH 17.1 (L) 26.0 - 34.0 pg   MCHC 28.5 (L) 30.0 - 36.0 g/dL   RDW 11.9 (H) 14.7 - 82.9 %   Platelets 340 150 - 400 K/uL  hCG, quantitative, pregnancy     Status: Abnormal   Collection Time: 05/26/17  7:01 PM  Result Value Ref Range   hCG, Beta Chain, Quant, S 216 (H) <5 mIU/mL   US Ob Less Than 14 Weeks With Ob  Transvaginal  Result Date: 05/26/2017 CLINICAL DATA:  Bleeding with irregular cycles and positive HCG EXAM: OBSTETRIC <14 WK [place] AND TRANSVAGINAL OB US TECHNIQUE: Both transabdominal and transvaginal ultrasound examinations were performed for complete evaluation of the gestation as well as the maternal uterus, adnexal regions, and pelvic cul-de-sac. Transvaginal technique was performed to assess early pregnancy. COMPARISON:  None. FINDINGS: Intrauterine gestational sac: Not seen Yolk sac:  Not seen Embryo:  Not seen Maternal uterus/adnexae: Ovaries are within normal limits. Right ovary measures 3.4 x 2.5 x 1.9 cm. The left ovary measures 2.4 by 3.5 x 1.9 cm. No significant free fluid. IMPRESSION: No intrauterine pregnancy identified. Findings consistent with pregnancy of unknown location, differential of which includes IUP too early to visualize, occult ectopic, and recent failed pregnancy. Suggest trending of HCG with repeat ultrasound as indicated. Electronically Signed   By: Jasmine Pang M.D.   On: 05/26/2017 20:29   MAU Course  Procedures  MDM   Assessment and Plan   1. Pregnancy, location unknown   2. Vaginal bleeding in pregnancy, first trimester    DC home Comfort measures reviewed  Bleeding precautions Ectopic precautionss RX: none  Return to MAU as needed FU in 48 hours in the clinic for repeat hcg  Follow-up Information    [facility].   Specialty:  Obstetrics and Gynecology Why:  FRIDAY 05/29/17 AT 9:00 AM FOR REPEAT BLOOD WORK  Contact information: [address] [place] [address] [phone]           Thressa Sheller 05/26/2017, 8:30 PM

## 2017-05-26 NOTE — Telephone Encounter (Signed)
Left voicemail on cell phone for patient to call [facility] today, Dr Vergie LivingPickens [facility] Quant.

## 2017-05-26 NOTE — Telephone Encounter (Signed)
Pt called in wanting lab results. Had Dr. Marice Potterove review as she was in office today. Advised pt to start iron supplement and come back in the morning for follow up quant. Pt expressed understanding.

## 2017-05-27 ENCOUNTER — Other Ambulatory Visit: Payer: Managed Care, Other (non HMO)

## 2017-05-27 ENCOUNTER — Telehealth: Payer: Self-pay | Admitting: General Practice

## 2017-05-27 NOTE — Telephone Encounter (Signed)
Called patient regarding office being closed Friday and need to go to MAU for bhcg follow up- no answer. Left message on voicemail stating we are calling to let you know you need follow up on Friday but our office will be closed please return on Friday to where you were seen last night. Please call us back if you have questions

## 2017-05-29 ENCOUNTER — Inpatient Hospital Stay (HOSPITAL_COMMUNITY): Payer: Managed Care, Other (non HMO)

## 2017-05-29 ENCOUNTER — Inpatient Hospital Stay (HOSPITAL_COMMUNITY)
Admission: AD | Admit: 2017-05-29 | Discharge: 2017-05-29 | Disposition: A | Discharge status: Home / Self Care | Payer: Managed Care, Other (non HMO) | Source: Ambulatory Visit | Attending: Family Medicine | Admitting: Family Medicine

## 2017-05-29 ENCOUNTER — Encounter (HOSPITAL_COMMUNITY): Payer: Self-pay | Admitting: *Deleted

## 2017-05-29 DIAGNOSIS — O00109 Unspecified tubal pregnancy without intrauterine pregnancy: Secondary | ICD-10-CM | POA: Diagnosis not present

## 2017-05-29 DIAGNOSIS — O26899 Other specified pregnancy related conditions, unspecified trimester: Secondary | ICD-10-CM

## 2017-05-29 DIAGNOSIS — O00101 Right tubal pregnancy without intrauterine pregnancy: Secondary | ICD-10-CM

## 2017-05-29 DIAGNOSIS — O208 Other hemorrhage in early pregnancy: Secondary | ICD-10-CM

## 2017-05-29 DIAGNOSIS — R109 Unspecified abdominal pain: Secondary | ICD-10-CM | POA: Insufficient documentation

## 2017-05-29 DIAGNOSIS — O209 Hemorrhage in early pregnancy, unspecified: Secondary | ICD-10-CM | POA: Insufficient documentation

## 2017-05-29 HISTORY — DX: Essential (primary) hypertension: I10

## 2017-05-29 LAB — COMPREHENSIVE METABOLIC PANEL
ALT: 14 U/L (ref 14–54)
AST: 15 U/L (ref 15–41)
Albumin: 3.6 g/dL (ref 3.5–5.0)
Alkaline Phosphatase: 62 U/L (ref 38–126)
Anion gap: 7 (ref 5–15)
BUN: 13 mg/dL (ref 6–20)
CO2: 23 mmol/L (ref 22–32)
Calcium: 8.5 mg/dL — ABNORMAL LOW (ref 8.9–10.3)
Chloride: 109 mmol/L (ref 101–111)
Creatinine, Ser: 0.58 mg/dL (ref 0.44–1.00)
GFR calc Af Amer: >60 mL/min (ref >60)
GFR calc non Af Amer: >60 mL/min (ref >60)
Glucose, Bld: 77 mg/dL (ref 65–99)
Potassium: 3.7 mmol/L (ref 3.5–5.1)
Sodium: 139 mmol/L (ref 135–145)
Total Bilirubin: 0.3 mg/dL (ref 0.3–1.2)
Total Protein: 7.1 g/dL (ref 6.5–8.1)

## 2017-05-29 LAB — CBC
HCT: 32 % — ABNORMAL LOW (ref 36.0–46.0)
Hemoglobin: 9.1 g/dL — ABNORMAL LOW (ref 12.0–15.0)
MCH: 17.2 pg — ABNORMAL LOW (ref 26.0–34.0)
MCHC: 28.4 g/dL — ABNORMAL LOW (ref 30.0–36.0)
MCV: 60.6 fL — ABNORMAL LOW (ref 78.0–100.0)
Platelets: 323 10*3/uL (ref 150–400)
RBC: 5.28 MIL/uL — ABNORMAL HIGH (ref 3.87–5.11)
RDW: 21 % — ABNORMAL HIGH (ref 11.5–15.5)
WBC: 8 10*3/uL (ref 4.0–10.5)

## 2017-05-29 LAB — HCG, QUANTITATIVE, PREGNANCY: hCG, Beta Chain, Quant, S: 254 m[IU]/mL — ABNORMAL HIGH (ref <5)

## 2017-05-29 MED ORDER — METHOTREXATE INJECTION FOR WOMEN'S HOSPITAL
50.0000 mg/m2 | Freq: Once | INTRAMUSCULAR | Status: AC
  Administered 2017-05-29: 125 mg via INTRAMUSCULAR
  Filled 2017-05-29: qty 2.5

## 2017-05-29 NOTE — Discharge Instructions (Signed)
Methotrexate Treatment for an Ectopic Pregnancy, Care After °Refer to this sheet in the next few weeks. These instructions provide you with information on caring for yourself after your procedure. Your health care provider may also give you more specific instructions. Your treatment has been planned according to current medical practices, but problems sometimes occur. Call your health care provider if you have any problems or questions after your procedure. °What can I expect after the procedure? °You may have some abdominal cramping, vaginal bleeding, and fatigue in the first few days after taking methotrexate. Some other possible side effects of methotrexate include: °· Nausea. °· Vomiting. °· Diarrhea. °· Mouth sores. °· Swelling or irritation of the lining of your lungs (pneumonitis). °· Liver damage. °· Hair loss. ° °Follow these instructions at home: °After you have received the methotrexate medicine, you need to be careful of your activities and watch your condition for several weeks. It may take 1 week before your hormone levels return to normal. °Activity °· Do not have sexual intercourse until your health care provider says it is safe to do so. °· You may resume your usual diet. °· Limit strenuous activity. °· Do not drink alcohol. °General instructions °· Do not take aspirin, ibuprofen, or naproxen (nonsteroidal anti-inflammatory drugs [NSAIDs]). °· Do not take folic acid, prenatal vitamins, or other vitamins that contain folic acid. °· Avoid traveling too far away from your health care provider. °· Keep all follow-up visits as told by your health care provider. This is important. °Contact a health care provider if: °· You cannot control your nausea and vomiting. °· You cannot control your diarrhea. °· You have sores in your mouth and want treatment. °· You need pain medicine for your abdominal pain. °· You have a rash. °· You are having a reaction to the medicine. °Get help right away if: °· You have  increasing abdominal or pelvic pain. °· You notice increased bleeding. °· You feel light-headed, or you faint. °· You have shortness of breath. °· Your heart rate increases. °· You have a cough. °· You have chills. °· You have a fever. °This information is not intended to replace advice given to you by your health care provider. Make sure you discuss any questions you have with your health care provider. °Document Released: 01/16/2011 Document Revised: 07/05/2015 Document Reviewed: 11/15/2012 °Elsevier Interactive Patient Education © 2017 Elsevier Inc. ° °Ectopic Pregnancy °An ectopic pregnancy is when the fertilized egg attaches (implants) outside the uterus. Most ectopic pregnancies occur in one of the tubes where eggs travel from the ovary to the uterus (fallopian tubes), but the implanting can occur in other locations. In rare cases, ectopic pregnancies occur on the ovary, intestine, pelvis, abdomen, or cervix. In an ectopic pregnancy, the fertilized egg does not have the ability to develop into a normal, healthy baby. °A ruptured ectopic pregnancy is one in which tearing or bursting of a fallopian tube causes internal bleeding. Often, there is intense lower abdominal pain, and vaginal bleeding sometimes occurs. Having an ectopic pregnancy can be life-threatening. If this dangerous condition is not treated, it can lead to blood loss, shock, or even death. °What are the causes? °The most common cause of this condition is damage to one of the fallopian tubes. A fallopian tube may be narrowed or blocked, and that keeps the fertilized egg from reaching the uterus. °What increases the risk? °This condition is more likely to develop in women of childbearing age who have different levels of risk. The   levels of risk can be divided into three categories. °High risk °· You have gone through infertility treatment. °· You have had an ectopic pregnancy before. °· You have had surgery on the fallopian tubes, or another  surgical procedure, such as an abortion. °· You have had surgery to have the fallopian tubes tied (tubal ligation). °· You have problems or diseases of the fallopian tubes. °· You have been exposed to diethylstilbestrol (DES). This medicine was used until 1971, and it had effects on babies whose mothers took the medicine. °· You become pregnant while using an IUD (intrauterine device) for birth control. °Moderate risk °· You have a history of infertility. °· You have had an STI (sexually transmitted infection). °· You have a history of pelvic inflammatory disease (PID). °· You have scarring from endometriosis. °· You have multiple sexual partners. °· You smoke. °Low risk °· You have had pelvic surgery. °· You use vaginal douches. °· You became sexually active before age 18. °What are the signs or symptoms? °Common symptoms of this condition include normal pregnancy symptoms, such as missing a period, nausea, tiredness, abdominal pain, breast tenderness, and bleeding. However, ectopic pregnancy will have additional symptoms, such as: °· Pain with intercourse. °· Irregular vaginal bleeding or spotting. °· Cramping or pain on one side or in the lower abdomen. °· Fast heartbeat, low blood pressure, and sweating. °· Passing out while having a bowel movement. ° °Symptoms of a ruptured ectopic pregnancy and internal bleeding may include: °· Sudden, severe pain in the abdomen and pelvis. °· Dizziness, weakness, light-headedness, or fainting. °· Pain in the shoulder or neck area. ° °How is this diagnosed? °This condition is diagnosed by: °· A pelvic exam to locate pain or a mass in the abdomen. °· A pregnancy test. This blood test checks for the presence as well as the specific level of pregnancy hormone in the bloodstream. °· Ultrasound. This is performed if a pregnancy test is positive. In this test, a probe is inserted into the vagina. The probe will detect a fetus, possibly in a location other than the uterus. °· Taking  a sample of uterus tissue (dilation and curettage, or D&C). °· Surgery to perform a visual exam of the inside of the abdomen using a thin, lighted tube that has a tiny camera on the end (laparoscope). °· Culdocentesis. This procedure involves inserting a needle at the top of the vagina, behind the uterus. If blood is present in this area, it may indicate that a fallopian tube is torn. ° °How is this treated? °This condition is treated with medicine or surgery. °Medicine °· An injection of a medicine (methotrexate) may be given to cause the pregnancy tissue to be absorbed. This medicine may save your fallopian tube. It may be given if: °? The diagnosis is made early, with no signs of active bleeding. °? The fallopian tube has not ruptured. °? You are considered to be a good candidate for the medicine. °Usually, pregnancy hormone blood levels are checked after methotrexate treatment. This is to be sure that the medicine is effective. It may take 4-6 weeks for the pregnancy to be absorbed. Most pregnancies will be absorbed by 3 weeks. °Surgery °· A laparoscope may be used to remove the pregnancy tissue. °· If severe internal bleeding occurs, a larger cut (incision) may be made in the lower abdomen (laparotomy) to remove the fetus and placenta. This is done to stop the bleeding. °· Part or all of the fallopian tube may be   removed (salpingectomy) along with the fetus and placenta. The fallopian tube may also be repaired during the surgery. °· In very rare circumstances, removal of the uterus (hysterectomy) may be required. °· After surgery, pregnancy hormone testing may be done to be sure that there is no pregnancy tissue left. °Whether your treatment is medicine or surgery, you may receive a Rho (D) immune globulin shot to prevent problems with any future pregnancy. This shot may be given if: °· You are Rh-negative and the baby's father is Rh-positive. °· You are Rh-negative and you do not know the Rh type of the baby's  father. ° °Follow these instructions at home: °· Rest and limit your activity after the procedure for as long as told by your health care provider. °· Until your health care provider says that it is safe: °? Do not lift anything that is heavier than 10 lb (4.5 kg), or the limit that your health care provider tells you. °? Avoid physical exercise and any movement that requires effort (is strenuous). °· To help prevent constipation: °? Eat a healthy diet that includes fruits, vegetables, and whole grains. °? Drink 6-8 glasses of water per day. °Get help right away if: °· You develop worsening pain that is not relieved by medicine. °· You have: °? A fever or chills. °? Vaginal bleeding. °? Redness and swelling at the incision site. °? Nausea and vomiting. °· You feel dizzy or weak. °· You feel light-headed or you faint. °This information is not intended to replace advice given to you by your health care provider. Make sure you discuss any questions you have with your health care provider. °Document Released: 03/06/2004 Document Revised: 09/26/2015 Document Reviewed: 08/29/2015 °Elsevier Interactive Patient Education © 2018 Elsevier Inc. ° °

## 2017-05-29 NOTE — MAU Note (Signed)
Pt here for follow up hcg. Bleeding has gotten worse and pain is 4/10, at night 9/10.

## 2017-05-29 NOTE — MAU Provider Note (Signed)
History     CSN: [id number]  Arrival date and time: 05/29/17 0847   First Provider Initiated Contact with Patient 05/29/17 1001     Chief Complaint  Patient presents with  . Vaginal Bleeding  . Abdominal Pain   HPI MAKENLY LARABEE is a 37 y.o. [id number] at unknown gestational age who presents for follow up bHCG. She reports an increase in vaginal bleeding since 4/16 and reports pain that she rates a 4/10. She states the pain is worse at night. She has not taken any medication for the pain.   OB History    Gravida  2   Para  1   Term  0   Preterm  1   AB  0   Living  2     SAB  0   TAB  0   Ectopic  0   Multiple  1   Live Births  2           Past Medical History:  Diagnosis Date  . 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 Headache(784.0)    migraines  . Hypertension   . Infertility of tubal origin   . Polycystic ovarian syndrome     Past Surgical History:  Procedure Laterality Date  . CESAREAN SECTION  06/26/2011   Procedure: CESAREAN SECTION;  Surgeon: Catalina Antigua, MD;  Location: [facility];  Service: Gynecology;  Laterality: N/A;  . DIAGNOSTIC LAPAROSCOPY  07/24/2010   REMOVAL OF BILATERAL TUBES BLOCKAGE  . GASTRIC BYPASS  2004    Family History  Problem Relation Age of Onset  . Asthma Mother   . Diabetes Mother   . Arthritis Mother   . Fibromyalgia Mother   . Hypertension Father   . Diabetes Father   . Hypertension Maternal Grandmother   . Arthritis Maternal Grandmother     Social History   Tobacco Use  . Smoking status: Never Smoker  . Smokeless tobacco: Never Used  Substance Use Topics  . Alcohol use: Yes    Comment: SOCIALLY  . Drug use: No    Allergies:  Allergies  Allergen Reactions  . Shellfish Allergy Hives    Medications Prior to Admission  Medication Sig Dispense Refill Last Dose  . ferrous sulfate 325 (65 FE) MG tablet Take 325 mg by mouth daily with breakfast.   Taking    Review of Systems   Constitutional: Negative.  Negative for fatigue and fever.  HENT: Negative.   Respiratory: Negative.  Negative for shortness of breath.   Cardiovascular: Negative.  Negative for chest pain.  Gastrointestinal: Positive for abdominal pain. Negative for constipation, diarrhea, nausea and vomiting.  Genitourinary: Positive for vaginal bleeding. Negative for dysuria.  Neurological: Negative.  Negative for dizziness and headaches.   Physical Exam   Blood pressure 128/66, pulse 98, temperature 98.3 F (36.8 C), temperature source Oral, resp. rate 16, weight 298 lb (135.2 kg), last menstrual period 03/20/2017, SpO2 100 %.  Physical Exam  Nursing note and vitals reviewed. Constitutional: She is oriented to person, place, and time. She appears well-developed and well-nourished. No distress.  HENT:  Head: Normocephalic.  Eyes: Pupils are equal, round, and reactive to light.  Cardiovascular: Normal rate, regular rhythm and normal heart sounds.  Respiratory: Effort normal and breath sounds normal. No respiratory distress.  GI: Soft. Bowel sounds are normal. She exhibits no distension. There is no tenderness.  Neurological: She is alert and oriented to person, place, and time.  Skin: Skin is warm and dry.  Psychiatric: She has a normal mood and affect. Her behavior is normal. Judgment and thought content normal.    MAU Course  Procedures Results for SHAUNI, HENNER (MRN [id number]) as of 05/29/2017 10:02  Ref. Range 05/26/2017 08:32 05/26/2017 14:09 05/26/2017 19:01 05/26/2017 20:16 05/29/2017 09:11  HCG, Beta Chain, Quant, S Latest Ref Range: <5 mIU/mL   216 (H)  254 (H)   US Ob Transvaginal  Result Date: 05/29/2017 CLINICAL DATA:  Vaginal bleeding EXAM: TRANSVAGINAL OB ULTRASOUND TECHNIQUE: Transvaginal ultrasound was performed for complete evaluation of the gestation as well as the maternal uterus, adnexal regions, and pelvic cul-de-sac. COMPARISON:  05/26/2017 FINDINGS: Intrauterine gestational  sac: None visualized Yolk sac:  None visualized Embryo:  None visualized Cardiac Activity: N/A Heart Rate:  bpm MSD:   mm    w     d CRL:     mm    w  d                  [place] EDC: Subchorionic hemorrhage:  N/A Maternal uterus/adnexae: There is a solid-appearing area adjacent to the right ovary concerning for right adnexal mass measuring 1.9 x 1.5 x 1.3 cm. This appears separate from the right ovary with internal blood flow. Cannot exclude early ectopic pregnancy. Trace free fluid in the pelvis. IMPRESSION: Concern for small mass adjacent to the right ovary measuring 1.9 x 1.5 x 1.3 cm concerning for early ectopic pregnancy. These results were called by telephone at the time of interpretation on 05/29/2017 at 11:21 am to Dr. Cleone Slim , who verbally acknowledged these results. Electronically Signed   By: Charlett Nose M.D.   On: 05/29/2017 11:23   Results for orders placed or performed during the hospital encounter of 05/29/17 (from the past 24 hour(s))  hCG, quantitative, pregnancy     Status: Abnormal   Collection Time: 05/29/17  9:11 AM  Result Value Ref Range   hCG, Beta Chain, Quant, S 254 (H) <5 mIU/mL  CBC     Status: Abnormal   Collection Time: 05/29/17  9:11 AM  Result Value Ref Range   WBC 8.0 4.0 - 10.5 K/uL   RBC 5.28 (H) 3.87 - 5.11 MIL/uL   Hemoglobin 9.1 (L) 12.0 - 15.0 g/dL   HCT 40.9 (L) 81.1 - 91.4 %   MCV 60.6 (L) 78.0 - 100.0 fL   MCH 17.2 (L) 26.0 - 34.0 pg   MCHC 28.4 (L) 30.0 - 36.0 g/dL   RDW 78.2 (H) 95.6 - 21.3 %   Platelets 323 150 - 400 K/uL  Comprehensive metabolic panel     Status: Abnormal   Collection Time: 05/29/17  9:11 AM  Result Value Ref Range   Sodium 139 135 - 145 mmol/L   Potassium 3.7 3.5 - 5.1 mmol/L   Chloride 109 101 - 111 mmol/L   CO2 23 22 - 32 mmol/L   Glucose, Bld 77 65 - 99 mg/dL   BUN 13 6 - 20 mg/dL   Creatinine, Ser 0.86 0.44 - 1.00 mg/dL   Calcium 8.5 (L) 8.9 - 10.3 mg/dL   Total Protein 7.1 6.5 - 8.1 g/dL   Albumin 3.6 3.5 - 5.0  g/dL   AST 15 15 - 41 U/L   ALT 14 14 - 54 U/L   Alkaline Phosphatase 62 38 - 126 U/L   Total Bilirubin 0.3 0.3 - 1.2 mg/dL   GFR calc non Af Amer >60 >60 mL/min  GFR calc Af Amer >60 >60 mL/min   Anion gap 7 5 - 15   MDM HCG US OB Transvaginal Consulted with Dr. Adrian BlackwaterStinson- will offer patient methotrexate.  Patient agreeable to plan of care. Agrees to follow up on day 4 and day 7 for repeat blood work.  CBC, CMP Labs reviewed with Dr. Adrian BlackwaterStinson- ok to give methotrexate Methotrexate  Assessment and Plan   1. Right tubal pregnancy without intrauterine pregnancy   2. Abdominal pain affecting pregnancy   3. Vaginal bleeding affecting early pregnancy    -Discharge home in stable condition -Strict ectopic precautions discussed and methotrexate after care reviewed -[facility] 4/22 for day 4 and 4/25 for day 7 -Patient may return to MAU as needed or if her condition were to change or worsen  Rolm BookbinderCaroline M Kizzie Cotten CNM 05/29/2017, 10:01 AM

## 2017-05-29 NOTE — MAU Note (Signed)
Urine in lab 

## 2017-05-29 NOTE — MAU Note (Signed)
MTX information sheet given to patient.

## 2017-06-01 ENCOUNTER — Encounter: Payer: Self-pay | Admitting: Radiology

## 2017-06-01 ENCOUNTER — Encounter: Payer: Self-pay | Admitting: *Deleted

## 2017-06-01 ENCOUNTER — Ambulatory Visit (INDEPENDENT_AMBULATORY_CARE_PROVIDER_SITE_OTHER): Payer: Managed Care, Other (non HMO) | Admitting: General Practice

## 2017-06-01 DIAGNOSIS — O3680X0 Pregnancy with inconclusive fetal viability, not applicable or unspecified: Secondary | ICD-10-CM

## 2017-06-01 DIAGNOSIS — O283 Abnormal ultrasonic finding on antenatal screening of mother: Secondary | ICD-10-CM

## 2017-06-01 LAB — HCG, QUANTITATIVE, PREGNANCY: hCG, Beta Chain, Quant, S: 291 m[IU]/mL — ABNORMAL HIGH

## 2017-06-01 NOTE — Progress Notes (Signed)
I have reviewed the nurse's note, and agree with the plan of care.  Thressa ShellerHeather Sevon Rotert 1:06 PM 06/01/17

## 2017-06-01 NOTE — Progress Notes (Signed)
Patient here for stat bhcg day #4 labs following MTX. Patient reports bleeding like a period. Patient denies pain, just feels pressure. Discussed with patient we are monitoring her bhcg levels today and asked she wait in lobby for results/updated plan of care. Patient verbalized understanding & had no questions at this time.   Reviewed results with Thressa ShellerHeather Hogan, who finds slight rise in bhcg levels but normal for day #4- patient needs to return Thursday 4/25 for day #7 labs.   Informed patient of results and planned follow up on Thursday for day #7 labs. Patient verbalized understanding & had no questions.

## 2017-06-04 ENCOUNTER — Telehealth: Payer: Self-pay | Admitting: General Practice

## 2017-06-04 ENCOUNTER — Ambulatory Visit: Payer: Managed Care, Other (non HMO)

## 2017-06-04 ENCOUNTER — Inpatient Hospital Stay (HOSPITAL_COMMUNITY)
Admission: AD | Admit: 2017-06-04 | Discharge: 2017-06-04 | Disposition: A | Discharge status: Home / Self Care | Payer: Managed Care, Other (non HMO) | Source: Ambulatory Visit | Attending: Obstetrics and Gynecology | Admitting: Obstetrics and Gynecology

## 2017-06-04 DIAGNOSIS — O009 Unspecified ectopic pregnancy without intrauterine pregnancy: Secondary | ICD-10-CM

## 2017-06-04 LAB — HCG, QUANTITATIVE, PREGNANCY: hCG, Beta Chain, Quant, S: 214 m[IU]/mL — ABNORMAL HIGH (ref <5)

## 2017-06-04 NOTE — MAU Provider Note (Addendum)
History   Chief Complaint:  Follow-up   Marilyn Wilkinson is  37 y.o. [id number] Patient's last menstrual period was 03/20/2017.Marland Kitchen Patient is here for follow up of quantitative HCG and ongoing surveillance of pregnancy status.   She is [redacted]w[redacted]d weeks gestation  by LMP.    Since her last visit, the patient is without new complaint.   The patient reports bleeding as  none now.    General ROS:  negative  Her previous Quantitative HCG values are:   Ref. Range 05/29/2017 09:11  HCG, Beta Chain, Quant, S Latest Ref Range: <5 mIU/mL 254 (H)    Ref. Range 06/01/2017 08:25  HCG, Beta Chain, Quant, S Latest Ref Range: <5 mIU/mL 291 (H)    Ref. Range 06/04/2017 19:22  HCG, Beta Chain, Quant, S Latest Ref Range: <5 mIU/mL 214 (H)      Physical Exam   Blood pressure 111/76, pulse 88, temperature 98.7 F (37.1 C), temperature source Oral, resp. rate 20, last menstrual period 03/20/2017.  Focused Gynecological Exam: examination not indicated  Labs: Results for orders placed or performed during the hospital encounter of 06/04/17 (from the past 24 hour(s))  hCG, quantitative, pregnancy   Collection Time: 06/04/17  7:22 PM  Result Value Ref Range   hCG, Beta Chain, Quant, S 214 (H) <5 mIU/mL    Ultrasound Studies:   US Ob Transvaginal  Result Date: 05/29/2017 CLINICAL DATA:  Vaginal bleeding EXAM: TRANSVAGINAL OB ULTRASOUND TECHNIQUE: Transvaginal ultrasound was performed for complete evaluation of the gestation as well as the maternal uterus, adnexal regions, and pelvic cul-de-sac. COMPARISON:  05/26/2017 FINDINGS: Intrauterine gestational sac: None visualized Yolk sac:  None visualized Embryo:  None visualized Cardiac Activity: N/A Heart Rate:  bpm MSD:   mm    w     d CRL:     mm    w  d                  [place] EDC: Subchorionic hemorrhage:  N/A Maternal uterus/adnexae: There is a solid-appearing area adjacent to the right ovary concerning for right adnexal mass measuring 1.9 x 1.5 x 1.3 cm. This appears  separate from the right ovary with internal blood flow. Cannot exclude early ectopic pregnancy. Trace free fluid in the pelvis. IMPRESSION: Concern for small mass adjacent to the right ovary measuring 1.9 x 1.5 x 1.3 cm concerning for early ectopic pregnancy. These results were called by telephone at the time of interpretation on 05/29/2017 at 11:21 am to Dr. Cleone Slim , who verbally acknowledged these results. Electronically Signed   By: Charlett Nose M.D.   On: 05/29/2017 11:23   US Ob Less Than 14 Weeks With Ob Transvaginal  Result Date: 05/26/2017 CLINICAL DATA:  Bleeding with irregular cycles and positive HCG EXAM: OBSTETRIC <14 WK [place] AND TRANSVAGINAL OB US TECHNIQUE: Both transabdominal and transvaginal ultrasound examinations were performed for complete evaluation of the gestation as well as the maternal uterus, adnexal regions, and pelvic cul-de-sac. Transvaginal technique was performed to assess early pregnancy. COMPARISON:  None. FINDINGS: Intrauterine gestational sac: Not seen Yolk sac:  Not seen Embryo:  Not seen Maternal uterus/adnexae: Ovaries are within normal limits. Right ovary measures 3.4 x 2.5 x 1.9 cm. The left ovary measures 2.4 by 3.5 x 1.9 cm. No significant free fluid. IMPRESSION: No intrauterine pregnancy identified. Findings consistent with pregnancy of unknown location, differential of which includes IUP too early to visualize, occult ectopic, and recent failed pregnancy. Suggest trending of  HCG with repeat ultrasound as indicated. Electronically Signed   By: Jasmine PangKim  Fujinaga M.D.   On: 05/26/2017 20:29    Assessment:  4011w6d weeks gestation    Ectopic Pregnancy on Right Post Methotrexate treatment, Day #7  Plan: The patient is instructed to follow up in in 7 days in clinic (message sent) for HCG Ectopic precautions.  Wynelle BourgeoisMarie Mete Purdum 06/04/2017, 8:23 PM

## 2017-06-04 NOTE — Discharge Instructions (Signed)
Methotrexate Treatment for an Ectopic Pregnancy, Care After °Refer to this sheet in the next few weeks. These instructions provide you with information on caring for yourself after your procedure. Your health care provider may also give you more specific instructions. Your treatment has been planned according to current medical practices, but problems sometimes occur. Call your health care provider if you have any problems or questions after your procedure. °What can I expect after the procedure? °You may have some abdominal cramping, vaginal bleeding, and fatigue in the first few days after taking methotrexate. Some other possible side effects of methotrexate include: °· Nausea. °· Vomiting. °· Diarrhea. °· Mouth sores. °· Swelling or irritation of the lining of your lungs (pneumonitis). °· Liver damage. °· Hair loss. ° °Follow these instructions at home: °After you have received the methotrexate medicine, you need to be careful of your activities and watch your condition for several weeks. It may take 1 week before your hormone levels return to normal. °Activity °· Do not have sexual intercourse until your health care provider says it is safe to do so. °· You may resume your usual diet. °· Limit strenuous activity. °· Do not drink alcohol. °General instructions °· Do not take aspirin, ibuprofen, or naproxen (nonsteroidal anti-inflammatory drugs [NSAIDs]). °· Do not take folic acid, prenatal vitamins, or other vitamins that contain folic acid. °· Avoid traveling too far away from your health care provider. °· Keep all follow-up visits as told by your health care provider. This is important. °Contact a health care provider if: °· You cannot control your nausea and vomiting. °· You cannot control your diarrhea. °· You have sores in your mouth and want treatment. °· You need pain medicine for your abdominal pain. °· You have a rash. °· You are having a reaction to the medicine. °Get help right away if: °· You have  increasing abdominal or pelvic pain. °· You notice increased bleeding. °· You feel light-headed, or you faint. °· You have shortness of breath. °· Your heart rate increases. °· You have a cough. °· You have chills. °· You have a fever. °This information is not intended to replace advice given to you by your health care provider. Make sure you discuss any questions you have with your health care provider. °Document Released: 01/16/2011 Document Revised: 07/05/2015 Document Reviewed: 11/15/2012 °Elsevier Interactive Patient Education © 2017 Elsevier Inc. ° °

## 2017-06-04 NOTE — Telephone Encounter (Signed)
Patient no showed for stat bhcg appt today. Called patient, no answer- left message stating we are trying to reach you because you missed your follow up lab appt today. Please go to MAU today for follow up as it is very important. Will send mychart message

## 2017-06-04 NOTE — MAU Note (Signed)
PT IS HERE ON DAY 7  FOR LABS.   WAS HERE ON Monday  FOR LABS -  SO TOLD  TO RETURN  TODAY.     BLEEDING IS SAME AS Monday .  NO CRAMPS/ NO PAIN.   HAS BEEN LIGHT  HEADED  AND DIZZY - STARTED ON Monday.

## 2017-06-05 ENCOUNTER — Other Ambulatory Visit: Payer: Managed Care, Other (non HMO)

## 2017-06-10 ENCOUNTER — Other Ambulatory Visit: Payer: Self-pay

## 2017-06-10 ENCOUNTER — Telehealth: Payer: Self-pay | Admitting: General Practice

## 2017-06-10 DIAGNOSIS — O009 Unspecified ectopic pregnancy without intrauterine pregnancy: Secondary | ICD-10-CM

## 2017-06-10 NOTE — Telephone Encounter (Signed)
Called patient to schedule lab visit for 06/11/17 at 9:30am. Patient voiced understanding.

## 2017-06-11 ENCOUNTER — Other Ambulatory Visit: Payer: Managed Care, Other (non HMO)

## 2017-06-11 DIAGNOSIS — O009 Unspecified ectopic pregnancy without intrauterine pregnancy: Secondary | ICD-10-CM

## 2017-06-12 LAB — BETA HCG QUANT (REF LAB): hCG Quant: 124 m[IU]/mL

## 2017-06-19 ENCOUNTER — Encounter: Payer: Self-pay | Admitting: Obstetrics and Gynecology

## 2017-06-19 ENCOUNTER — Encounter: Payer: Self-pay | Admitting: Radiology

## 2017-06-19 ENCOUNTER — Ambulatory Visit: Payer: Managed Care, Other (non HMO) | Admitting: Obstetrics and Gynecology

## 2017-06-19 VITALS — BP 149/108 | HR 111 | Wt 295.6 lb

## 2017-06-19 DIAGNOSIS — O00101 Right tubal pregnancy without intrauterine pregnancy: Secondary | ICD-10-CM | POA: Diagnosis not present

## 2017-06-19 DIAGNOSIS — F53 Postpartum depression: Secondary | ICD-10-CM

## 2017-06-19 DIAGNOSIS — O99345 Other mental disorders complicating the puerperium: Secondary | ICD-10-CM

## 2017-06-19 HISTORY — DX: Right tubal pregnancy without intrauterine pregnancy: O00.101

## 2017-06-19 NOTE — Progress Notes (Signed)
Obstetrics and Gynecology Visit Return Patient Evaluation  Appointment Date: 06/19/2017  Primary Care Provider: Leotis Shames  Referring Provider: Leotis Shames, MD  Chief Complaint: mood check. F/u ectopic pregnancy s/p mtx tx on 4/19  History of Present Illness:  Marilyn Wilkinson is a 37 y.o. with above CC. See below for beta hcg trends Unfortunately, patient lost her mother a week after being dx with the ectopic and they were close.   Review of Systems: negative GYN wise Medications: none Allergies: is allergic to shellfish allergy.  Physical Exam:  BP (!) 149/108   Pulse (!) 111   Wt 295 lb 9.6 oz (134.1 kg)   LMP 03/20/2017   BMI 47.71 kg/m  Body mass index is 47.71 kg/m. General appearance: Well nourished, well developed female in no acute distress.  Neuro/Psych:  Normal mood and affect. Emotional and crying  EPDS 29. Pt states that if it wasn't for the support of her family and husband that she'd "want to be with her momma."  Beta HCG 5/2: 124 4/25: 214 4/22: 291 4/19: 254 (MTX given)  Assessment: pt stable  Plan:  1. Right tubal pregnancy without intrauterine pregnancy hcg today. Repeat qwk - Beta hCG quant (ref lab)  2. PP depression Contracts for safety. Pt here with husband and with great support system. Work note to be out until 5/27 and patient and husband amenable to seeing jamie at the main clinic  RTC: 1wk for f/u beta hcg and visit with me.   Cornelia Copa MD Attending Center for [employer] Midwife)

## 2017-06-19 NOTE — BH Specialist Note (Signed)
Integrated Behavioral Health Initial Visit  MRN: [id number] Name: Marilyn Wilkinson  Number of Integrated Behavioral Health Clinician visits:: 1/6 Session Start time: 10:15  Session End time: 11:15  Total time: 1 hour  Type of Service: Integrated Behavioral Health- Individual/Family Interpretor:No. Interpretor Name and Language: n/a   Warm Hand Off Completed.       SUBJECTIVE: Marilyn Wilkinson is a 37 y.o. female accompanied by Spouse Patient was referred by Dr Vergie Living for depression and SI postpartum, after tubal pregnancy. EPDS@29  Patient reports the following symptoms/concerns: Pt states she is grieving the loss of her ectopic pregnancy and her mother, had thoughts of "wanting to be with my mother, but not to hurt myself". Pt requesting antidepressant to cope with current depression, and is open to self-coping strategies today. Pt has had disrupted sleep, lack of appetite, depression and anxiety with panic attacks after the loss of her mother.  Duration of problem: Over one week; Severity of problem: severe  OBJECTIVE: Mood: Depressed and Affect: Depressed and Tearful Risk of harm to self or others: Suicidal ideation No plan to harm self or others  LIFE CONTEXT: Family and Social: Pt lives with husband and twin 6yo daughters School/Work: Temporary leave from work after losses Self-Care: - Life Changes: Loss of tubal pregnancy, then unexpected loss of mother  GOALS ADDRESSED: Patient will: 1. Reduce symptoms of: anxiety, depression, insomnia and stress 2. Increase knowledge and/or ability of: self-management skills  3. Demonstrate ability to: Increase healthy adjustment to current life circumstances, Increase adequate support systems for patient/family and Begin healthy grieving over loss  INTERVENTIONS: Interventions utilized: Mindfulness or Management consultant, Sleep Hygiene, Psychoeducation and/or Health Education and Link to [facility]  Standardized Assessments  completed: GAD-7 and PHQ 2&9 with C-SSRS  ASSESSMENT: Patient currently experiencing Grief.   Patient may benefit from psychoeducation and brief therapeutic interventions regarding coping with symptoms of depression and anxiety related to grief .  PLAN: 1. Follow up with behavioral health clinician on : One week 2. Behavioral recommendations:  -Go to ED if thoughts of death with SI return -Begin taking BH medication as prescribed by medical provider -CALM relaxation breathing exercise every morning, and at bedtime -Read educational materials regarding coping with symptoms of depression and anxiety with panic attack, related to grief.   3. Referral(s): Integrated Art gallery manager (In Clinic) and [employer]:  [facility] and Hospice grief support 4. "From scale of 1-10, how likely are you to follow plan?": 9  Valetta Close [place], LCSW  Edinburgh Postnatal Depression Scale - 06/19/17 0831      Edinburgh Postnatal Depression Scale:  In the Past 7 Days   I have been able to laugh and see the funny side of things.  3    I have looked forward with enjoyment to things.  3    I have blamed myself unnecessarily when things went wrong.  3    I have been anxious or worried for no good reason.  3    I have felt scared or panicky for no good reason.  3    Things have been getting on top of me.  3    I have been so unhappy that I have had difficulty sleeping.  3    I have felt sad or miserable.  3    I have been so unhappy that I have been crying.  3    The thought of harming myself has occurred to me.  2    [place]  Postnatal Depression Scale Total  29      Depression screen PHQ 2/9 06/22/2017  Decreased Interest 3  Down, Depressed, Hopeless 3  PHQ - 2 Score 6  Altered sleeping 3  Tired, decreased energy 3  Change in appetite 2  Feeling bad or failure about yourself  3  Trouble concentrating 3  Moving slowly or fidgety/restless 3  Suicidal thoughts 1  PHQ-9 Score  24   GAD 7 : Generalized Anxiety Score 06/22/2017  Nervous, Anxious, on Edge 3  Control/stop worrying 3  Worry too much - different things 3  Trouble relaxing 3  Restless 3  Easily annoyed or irritable 3  Afraid - awful might happen 3  Total GAD 7 Score 21

## 2017-06-20 LAB — BETA HCG QUANT (REF LAB): hCG Quant: 10 m[IU]/mL

## 2017-06-22 ENCOUNTER — Encounter: Payer: Self-pay | Admitting: *Deleted

## 2017-06-22 ENCOUNTER — Other Ambulatory Visit: Payer: Self-pay | Admitting: Obstetrics and Gynecology

## 2017-06-22 ENCOUNTER — Ambulatory Visit (INDEPENDENT_AMBULATORY_CARE_PROVIDER_SITE_OTHER): Payer: Managed Care, Other (non HMO) | Admitting: Clinical

## 2017-06-22 DIAGNOSIS — F4321 Adjustment disorder with depressed mood: Secondary | ICD-10-CM | POA: Diagnosis not present

## 2017-06-22 MED ORDER — SERTRALINE HCL 50 MG PO TABS: 50.0000 mg | ORAL_TABLET | Freq: Every day | ORAL | 0 refills | Status: DC

## 2017-06-22 NOTE — Progress Notes (Signed)
Seen by [facility] today and felt that SSRI may be helpful for her. No s/s of bipolar or mood d/o and I feel that's reasonable. Will start sertraline 50 qday and see patient back later this week. Contracts for safety and ED precautions given.  Cornelia Copa MD Attending Center for [employer] (Faculty Practice) 06/22/2017 Time: 11am

## 2017-06-24 ENCOUNTER — Telehealth: Payer: Self-pay | Admitting: Clinical

## 2017-06-24 NOTE — Telephone Encounter (Signed)
BH med management follow-up: Pt says she has not picked up her BH medication from the pharmacy yet, as she's no longer living close to the [facility] in [place], but says her husband will pick up the prescription for her "later today". Pt verbally agrees to have [facility] call again on Tuesday to confirm the medication has been picked up and started taking as prescribed.

## 2017-06-25 ENCOUNTER — Ambulatory Visit (INDEPENDENT_AMBULATORY_CARE_PROVIDER_SITE_OTHER): Payer: Managed Care, Other (non HMO) | Admitting: Obstetrics and Gynecology

## 2017-06-25 ENCOUNTER — Encounter: Payer: Self-pay | Admitting: Obstetrics and Gynecology

## 2017-06-25 VITALS — BP 126/84 | HR 84 | Ht 66.0 in | Wt 295.0 lb

## 2017-06-25 DIAGNOSIS — O99345 Other mental disorders complicating the puerperium: Secondary | ICD-10-CM | POA: Diagnosis not present

## 2017-06-25 DIAGNOSIS — F53 Postpartum depression: Secondary | ICD-10-CM

## 2017-06-25 DIAGNOSIS — O00101 Right tubal pregnancy without intrauterine pregnancy: Secondary | ICD-10-CM | POA: Diagnosis not present

## 2017-06-25 NOTE — Progress Notes (Signed)
Obstetrics and Gynecology Visit Return Patient Evaluation  Appointment Date: 06/25/2017  Primary Care Provider: Leotis Shames  Chief Complaint: follow up ectopic pregnancy and pp depression  History of Present Illness:  Marilyn Wilkinson is a 37 y.o. with above CC. Patient states that she feels that she is feeling but is still having bouts with ups and downs but is overall feeling better.   Review of Systems: as noted in the History of Present Illness.   Patient Active Problem List   Diagnosis Date Noted  . Right tubal pregnancy without intrauterine pregnancy 06/19/2017  . BMI 45.0-49.9, adult (HCC) 05/25/2017  . 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 Chronic hypertension 06/02/2011  . PCOS (polycystic ovarian syndrome) 03/03/2011   Medications and allergies reviewed  Physical Exam:  BP 126/84   Pulse 84   Ht  (1.676 m)   Wt 295 lb (133.8 kg)   LMP 03/20/2017   BMI 47.61 kg/m  Body mass index is 47.61 kg/m. General appearance: Well nourished, well developed female in no acute distress.    Assessment: pt improving  Plan:  1. Right tubal pregnancy without intrauterine pregnancy Follow up quant. I suspect it will be negative this time. Was 10 o 5/10 from 124 on 5/2 - Beta hCG quant (ref lab)  2. PP depression Pt hasn't started the sertraline b/c it got sent to the wrong pharmacy so she just picked it up.  D/w her re: use and to let us know if she needs to see [place] before then. Patient contracts for safety and is also seeing BH next week for additional follow up  RTC: 2wks  Cornelia Copa MD Attending Center for [employer] [facility])

## 2017-06-26 ENCOUNTER — Encounter: Payer: Self-pay | Admitting: Obstetrics and Gynecology

## 2017-06-26 ENCOUNTER — Encounter: Payer: Self-pay | Admitting: *Deleted

## 2017-06-26 LAB — BETA HCG QUANT (REF LAB): hCG Quant: <1 m[IU]/mL

## 2017-06-29 ENCOUNTER — Ambulatory Visit: Payer: Managed Care, Other (non HMO) | Admitting: Obstetrics and Gynecology

## 2017-06-30 ENCOUNTER — Telehealth: Payer: Self-pay | Admitting: Radiology

## 2017-06-30 ENCOUNTER — Ambulatory Visit (INDEPENDENT_AMBULATORY_CARE_PROVIDER_SITE_OTHER): Payer: Managed Care, Other (non HMO) | Admitting: Clinical

## 2017-06-30 DIAGNOSIS — F4321 Adjustment disorder with depressed mood: Secondary | ICD-10-CM | POA: Diagnosis not present

## 2017-06-30 NOTE — Telephone Encounter (Signed)
Called patient, to follow up to se if she has made an appointment with [facility], she states that she has not but is interested in transferring care to their office, however, it maybe a few months before she needs their services, I explained that they could probably schedule her out some time that way she will have an appointment. She agreed. I explained that would contact their office and see about having someone contact to get her scheduled.  Called [facility], and informed them of the referral and that the patient was expecting a call from them.

## 2017-06-30 NOTE — BH Specialist Note (Signed)
Integrated Behavioral Health Follow Up Visit  MRN: [id number] Name: Marilyn Wilkinson  Number of Integrated Behavioral Health Clinician visits: 2/6 Session Start time: 10:25  Session End time: 11:30 Total time: 55 minutes  Type of Service: Integrated Behavioral Health- Individual/Family Interpretor:No. Interpretor Name and Language: n/a  SUBJECTIVE: Marilyn Wilkinson is a 37 y.o. female accompanied by n/a Patient was referred by Dr Vergie Living for depression and SI postpartum after ectopic pregnancy (EPDS@29 ). Patient reports the following symptoms/concerns: Pt states she feels she is coping better with her losses in the past week, has been sleeping and regaining an appetite, has been taking the Zoloft at night (made her sleepy for the first two days, so switched to evening), has reconnected with extended family, and is feeling hopeful about the future.  Duration of problem: Over two weeks; Severity of problem: moderate  OBJECTIVE: Mood: Normal and Affect: Appropriate and Tearful Risk of harm to self or others: No plan to harm self or others  LIFE CONTEXT: Family and Social: Pt lives with her husband(10 year anniversary in July) and 6yo twin daughters; extended family on both sides have been supportive School/Work: Returned to work fulltime Self-Care: Taking time daily to reflect and care for self Life Changes: Ectopic pregnancy and unexpected loss of mother  GOALS ADDRESSED: Patient will: 1.  Continue to reduce symptoms of: anxiety, depression, insomnia and stress  2.  Increase knowledge and/or ability of: healthy habits  3.  Demonstrate ability to: Increase healthy adjustment to current life circumstances  INTERVENTIONS: Interventions utilized:  Brief CBT and Medication Monitoring Standardized Assessments completed: GAD-7 and PHQ 9  ASSESSMENT: Patient currently experiencing Grief  Patient may benefit from continued brief therapeutic interventions regarding coping with symptoms of  depression and anxiety related to grief.  PLAN: 1. Follow up with behavioral health clinician on : As requested by pt 2. Behavioral recommendations:  -Continue taking BH medication, as prescribed by medical provider -Continue spending time talking to family members and/or friends daily, and other self-coping strategies -Continue with plans to celebrate 3 year wedding anniversary the first week of July 3. Referral(s): Integrated [facility] (In Clinic) 4. "From scale of 1-10, how likely are you to follow plan?": 10  Rae Lips, LCSW  Depression screen [facility] 2/9 06/30/2017 06/22/2017  Decreased Interest 1 3  Down, Depressed, Hopeless 2 3  PHQ - 2 Score 3 6  Altered sleeping 1 3  Tired, decreased energy 2 3  Change in appetite 2 2  Feeling bad or failure about yourself  1 3  Trouble concentrating 1 3  Moving slowly or fidgety/restless 1 3  Suicidal thoughts 1 1  PHQ-9 Score 12 24   GAD 7 : Generalized Anxiety Score 06/30/2017 06/22/2017  Nervous, Anxious, on Edge 1 3  Control/stop worrying 1 3  Worry too much - different things 1 3  Trouble relaxing 1 3  Restless 1 3  Easily annoyed or irritable 1 3  Afraid - awful might happen 1 3  Total GAD 7 Score 7 21

## 2017-07-07 ENCOUNTER — Encounter: Payer: Self-pay | Admitting: *Deleted

## 2017-07-16 ENCOUNTER — Encounter: Payer: Self-pay | Admitting: Obstetrics and Gynecology

## 2017-07-16 ENCOUNTER — Ambulatory Visit (INDEPENDENT_AMBULATORY_CARE_PROVIDER_SITE_OTHER): Payer: Managed Care, Other (non HMO) | Admitting: Obstetrics and Gynecology

## 2017-07-16 VITALS — BP 133/89 | HR 72 | Wt 297.0 lb

## 2017-07-16 DIAGNOSIS — F53 Postpartum depression: Secondary | ICD-10-CM

## 2017-07-16 DIAGNOSIS — O99345 Other mental disorders complicating the puerperium: Principal | ICD-10-CM

## 2017-07-16 NOTE — Progress Notes (Signed)
Obstetrics and Gynecology Visit Return Patient Evaluation  Appointment Date: 07/16/2017  Primary Care Provider: Leotis ShamesSingh, Jasmine  [facility]  Chief Complaint: follow up PP depression  History of Present Illness:  Patient stats she hasn't started the zoloft and wanted to see if management w/o it would continue to help. Pt states that overall she is improving but does feel down at times.   Review of Systems: as noted in the History of Present Illness.  Medications: none Allergies: is allergic to shellfish allergy.  Physical Exam:  BP 133/89   Pulse 72   Wt 297 lb (134.7 kg)   LMP 03/20/2017   Breastfeeding? Unknown   BMI 47.94 kg/m  Body mass index is 47.94 kg/m. General appearance: Well nourished, well developed female in no acute distress.  Neuro/Psych:  Normal mood and affect.    EPDS 21 (no thoughts of SI)   Assessment: pt improving  Plan: D/w her that if she feels that she's improving w/o medication then she doesn't have to take it but I again encouraged her to see a counselor for regular follow up. Pt to consider options and let me know if she'd like for us to set this up for her.   Also [facility] and she believes that she doesn't want anymore children but isn't sure about a BTL or BC at this time. Options d/w her and pt to let us know.   RTC: PRN  Cornelia Copaharlie Kaislyn Gulas, Jr MD Attending Center for [facility](Faculty Practice)

## 2017-10-09 ENCOUNTER — Ambulatory Visit: Payer: Managed Care, Other (non HMO) | Admitting: Obstetrics and Gynecology

## 2017-10-09 ENCOUNTER — Encounter: Payer: Self-pay | Admitting: Obstetrics and Gynecology

## 2017-10-09 VITALS — BP 123/87 | HR 72 | Wt 305.2 lb

## 2017-10-09 DIAGNOSIS — Z3202 Encounter for pregnancy test, result negative: Secondary | ICD-10-CM

## 2017-10-09 DIAGNOSIS — N920 Excessive and frequent menstruation with regular cycle: Secondary | ICD-10-CM | POA: Diagnosis not present

## 2017-10-09 DIAGNOSIS — N946 Dysmenorrhea, unspecified: Secondary | ICD-10-CM

## 2017-10-09 DIAGNOSIS — Z30430 Encounter for insertion of intrauterine contraceptive device: Secondary | ICD-10-CM | POA: Diagnosis not present

## 2017-10-09 LAB — POCT URINE PREGNANCY: Preg Test, Ur: NEGATIVE

## 2017-10-09 MED ORDER — LEVONORGESTREL 20 MCG/24HR IU IUD
INTRAUTERINE_SYSTEM | Freq: Once | INTRAUTERINE | Status: AC
  Administered 2017-10-09: 10:00:00 via INTRAUTERINE

## 2017-10-09 MED ORDER — TRANEXAMIC ACID 650 MG PO TABS: 1300.0000 mg | ORAL_TABLET | Freq: Three times a day (TID) | ORAL | 2 refills | Status: AC

## 2017-10-09 NOTE — Progress Notes (Signed)
Obstetrics and Gynecology Established Patient Evaluation  Appointment Date: 10/09/2017  [facility]  Primary Care Provider: Leotis ShamesSingh, Marilyn  Referring Provider: Leotis ShamesSingh, Jasmine, MD  Chief Complaint:  Chief Complaint  Patient presents with  . Discuss hysterectomy    History of Present Illness: Marilyn HoughFiona F Wilkinson is a 37 y.o. African-American [phone] (Patient's last menstrual period was 10/08/2017.), seen for the above chief complaint. Her past medical history is significant for Her past medical history is significant for BMI 40s, HTN, PCOS, h/o tubal surgery   Now that her periods are back to being qmonth and regular, she is again having heavy and painful periods. Patient states she's had this prior to her miscarriage too; she is interested in a hysterectomy. Pt hasn't tried anything for this in the past except for NSAIDs with no help.   Review of Systems:  as noted in the History of Present Illness.  Past Medical History:  Past Medical History:  Diagnosis Date  . 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 Headache(784.0)    migraines  . Hypertension   . Infertility of tubal origin   . Polycystic ovarian syndrome   . Right tubal pregnancy without intrauterine pregnancy 06/19/2017   05/29/17: single dose mtx given    Past Surgical History:  Past Surgical History:  Procedure Laterality Date  . CESAREAN SECTION  06/26/2011   Procedure: CESAREAN SECTION;  Surgeon: Catalina AntiguaPeggy Constant, MD;  Location: [facility];  Service: Gynecology;  Laterality: N/A;  . DIAGNOSTIC LAPAROSCOPY  07/24/2010   REMOVAL OF BILATERAL TUBES BLOCKAGE  . GASTRIC BYPASS  2004    Past Obstetrical History:  OB History  Gravida Para Term Preterm AB Living  2 1 0 1 1 2   SAB TAB Ectopic Multiple Live Births  0 0 1 1 2     # Outcome Date GA Lbr Len/2nd Weight Sex Delivery Anes PTL Lv  2 Ectopic 06/2017          1A Preterm 06/26/11 4115w5d  1 lb 15 oz (0.88 kg) F  CS-LTranv Spinal  LIV  1B Preterm 06/26/11 3315w5d  1 lb 6.2 oz (0.63 kg) F CS-LTranv Spinal  LIV    Obstetric Comments  06/2017: rt tubal ectopic. MTx used    Past Gynecological History: As per HPI. Periods: qmonth, regular, approximately 5 days, heavy and painful History of Pap Smear(s): Yes.   Last pap 2017, which was NILM She is currently using nothing for contraception.   Social History:  Social History   Socioeconomic History  . Marital status: Married    Spouse name: Not on file  . Number of children: Not on file  . Years of education: Not on file  . Highest education level: Not on file  Occupational History  . Not on file  Social Needs  . Financial resource strain: Not on file  . Food insecurity:    Worry: Not on file    Inability: Not on file  . Transportation needs:    Medical: Not on file    Non-medical: Not on file  Tobacco Use  . Smoking status: Never Smoker  . Smokeless tobacco: Never Used  Substance and Sexual Activity  . Alcohol use: Yes    Comment: SOCIALLY  . Drug use: No  . Sexual activity: Yes    Birth control/protection: None  Lifestyle  . Physical activity:    Days per week: Not on file    Minutes per session: Not  on file  . Stress: Not on file  Relationships  . Social connections:    Talks on phone: Not on file    Gets together: Not on file    Attends religious service: Not on file    Active member of club or organization: Not on file    Attends meetings of clubs or organizations: Not on file    Relationship status: Not on file  . Intimate partner violence:    Fear of current or ex partner: Not on file    Emotionally abused: Not on file    Physically abused: Not on file    Forced sexual activity: Not on file  Other Topics Concern  . Not on file  Social History Narrative  . Not on file    Family History:  Family History  Problem Relation Age of Onset  . Asthma Mother   . Diabetes Mother   . Arthritis Mother   . Fibromyalgia  Mother   . Hypertension Father   . Diabetes Father   . Hypertension Maternal Grandmother   . Arthritis Maternal Grandmother     Medications Marilyn Wilkinson had no medications administered during this visit. Current Outpatient Medications  Medication Sig Dispense Refill  . sertraline (ZOLOFT) 50 MG tablet Take 1 tablet (50 mg total) by mouth daily. (Patient not taking: Reported on 07/16/2017) 30 tablet 0   No current facility-administered medications for this visit.     Allergies Shellfish allergy   Physical Exam:  BP 123/87   Pulse 72   Wt (!) 305 lb 3.2 oz (138.4 kg)   LMP 10/08/2017   BMI 49.26 kg/m  Body mass index is 49.26 kg/m. General appearance: Well nourished, well developed female in no acute distress.  Cardiovascular: normal s1 and s2.  No murmurs, rubs or gallops. Respiratory:  Clear to auscultation bilateral. Normal respiratory effort Abdomen: positive bowel sounds and no masses, hernias; diffusely non tender to palpation, non distended Neuro/Psych:  Normal mood and affect.  Skin:  Warm and dry.  Lymphatic:  No inguinal lymphadenopathy.   Pelvic exam: is limited by body habitus EGBUS: within normal limits, Vagina: within normal limits.  approximately 10mL of old blood in the vault, no active bleeding. Cervix: normal appearing cervix without tenderness, discharge or lesions. Uterus:  nonenlarged and non tender and Adnexa:  normal adnexa and no mass, fullness, tenderness Rectovaginal: deferred  See procedure note for uncomplicated Mirena IUD insertion  Laboratory: UPT negative  Radiology: prior early u/s reviewed and no e/o fibroids, polyps  Assessment: pt doing well  Plan:  I d/w her re: various options for the menorrhagia and dysmenorrhea and I told her I wouldn't go to surgery at this point w/o trying a medical option with the Mirena being a great option. She was amenable to this and this was placed today. In the interim, after d/w her re: r/b/a, she was  amenable to using lysteda for a few months to give the mirena time to work. I told her that if this doesn't work then would consider surgery with either a hyst, hysteroscopy, d&c or ablation.   RTC 1 month for string check  Cornelia Copa MD Attending Center for [facility])

## 2017-10-13 ENCOUNTER — Encounter: Payer: Self-pay | Admitting: Obstetrics and Gynecology

## 2017-10-13 NOTE — Procedures (Signed)
Intrauterine Device (IUD) Insertion Procedure Note  The patient understands the risks of IUD placement, which include but are not limited to: bleeding, infection, uterine perforation, risk of expulsion, risk of failure < 1%, increased risk of ectopic pregnancy in the event of failure.   Prior to the procedure being performed, the patient (or guardian) was asked to state their full name, date of birth, and the type of procedure being performed. A bimanual exam showed the uterus to be midposition.  Next, the cervix and vagina were cleaned with an antiseptic solution, and the cervix was grasped with a tenaculum.  The uterus was sounded to 8.5 cm.  The Mirena was placed without difficulty in the usual fashion.  The strings were cut to 3-4 cm.  The tenaculum was removed and cervix was found to be hemostatic.    No complications, patient tolerated the procedure well.  Cornelia Copa MD Attending Center for [facility])

## 2017-10-30 ENCOUNTER — Ambulatory Visit (INDEPENDENT_AMBULATORY_CARE_PROVIDER_SITE_OTHER): Payer: Managed Care, Other (non HMO) | Admitting: Obstetrics and Gynecology

## 2017-10-30 VITALS — BP 137/93 | HR 72 | Wt 295.8 lb

## 2017-10-30 DIAGNOSIS — Z30431 Encounter for routine checking of intrauterine contraceptive device: Secondary | ICD-10-CM | POA: Diagnosis not present

## 2017-10-30 NOTE — Progress Notes (Signed)
Obstetrics and Gynecology Visit Return Patient Evaluation  Appointment Date: 10/30/2017  Primary Care Provider: Leotis ShamesSingh, Jasmine  [facility]  Chief Complaint: string check  History of Present Illness:  Marilyn Wilkinson is a 37 y.o. s/p 8/30 Mirena IUD insertion. No problems or issue since placement  Review of Systems:  as noted in the History of Present Illness.  Patient Active Problem List   Diagnosis Date Noted  . BMI 45.0-49.9, adult (HCC) 05/25/2017  . 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 PCOS (polycystic ovarian syndrome) 03/03/2011   Medications:  Marilyn HoughFiona F. Ellithorpe had no medications administered during this visit. Current Outpatient Medications  Medication Sig Dispense Refill  . levonorgestrel (MIRENA) 20 MCG/24HR IUD 1 each by Intrauterine route once.    . sertraline (ZOLOFT) 50 MG tablet Take 1 tablet (50 mg total) by mouth daily. (Patient not taking: Reported on 10/30/2017) 30 tablet 0   No current facility-administered medications for this visit.     Allergies: is allergic to shellfish allergy.  Physical Exam:  BP (!) 137/93   Pulse 72   Wt 295 lb 12.8 oz (134.2 kg)   LMP 10/08/2017   BMI 47.74 kg/m  Body mass index is 47.74 kg/m. General appearance: Well nourished, well developed female in no acute distress.  Neuro/Psych:  Normal mood and affect.    Pelvic exam:  EGBUS, vaginal vault and cervix: within normal limits. IUD strings approx 3-4cm strings seen tucked into fornices  Assessment: doing well  Plan:  RTC: PRN  Cornelia Copaharlie Ihan Pat, Jr MD Attending Center for [facility](Faculty Practice)

## 2017-10-30 NOTE — Progress Notes (Signed)
Refill on medication for painful periods

## 2018-03-31 ENCOUNTER — Encounter: Payer: Self-pay | Admitting: Radiology

## 2018-08-03 NOTE — Progress Notes (Signed)
LAST PAP 10/2015- NEGATIVE

## 2018-08-05 ENCOUNTER — Other Ambulatory Visit: Payer: Self-pay

## 2018-08-05 ENCOUNTER — Encounter: Payer: Self-pay | Admitting: *Deleted

## 2018-08-05 ENCOUNTER — Ambulatory Visit (INDEPENDENT_AMBULATORY_CARE_PROVIDER_SITE_OTHER): Payer: Managed Care, Other (non HMO) | Admitting: Obstetrics and Gynecology

## 2018-08-05 ENCOUNTER — Encounter: Payer: Self-pay | Admitting: Obstetrics and Gynecology

## 2018-08-05 VITALS — BP 133/88 | HR 106 | Ht 66.0 in | Wt 267.0 lb

## 2018-08-05 DIAGNOSIS — T8339XA Other mechanical complication of intrauterine contraceptive device, initial encounter: Secondary | ICD-10-CM

## 2018-08-05 DIAGNOSIS — Z30432 Encounter for removal of intrauterine contraceptive device: Secondary | ICD-10-CM

## 2018-08-05 DIAGNOSIS — N912 Amenorrhea, unspecified: Secondary | ICD-10-CM

## 2018-08-05 DIAGNOSIS — Z01419 Encounter for gynecological examination (general) (routine) without abnormal findings: Secondary | ICD-10-CM

## 2018-08-05 MED ORDER — LEVONORGESTREL 1.5 MG PO TABS: 1.5000 mg | ORAL_TABLET | Freq: Once | ORAL | 0 refills | Status: AC

## 2018-08-05 NOTE — Progress Notes (Signed)
Obstetrics and Gynecology Annual Patient Evaluation  Appointment Date: 08/05/2018  [facility]  Primary Care Provider: Leotis ShamesSingh, Jasmine  Chief Complaint:  Chief Complaint  Patient presents with  . Gynecologic Exam    annual    History of Present Illness: Marilyn Wilkinson is a 38 y.o. African-American [phone] (Patient's last menstrual period was 04/11/2018.), seen for the above chief complaint. Her past medical history is significant for h/o menorrhagia, dysmenorrhea  Patient had mirena placed 09/2017 for heavy, painful periods. She states that she hasn't had a period with it for the past 3 months but states she can feel the IUD.   Has been trying find a PCP but no luck. She would like to go back on metformin with her PCOS.   No VB, discharge, itching, constipation, diarrhea.   Review of Systems:as noted in the History of Present Illness.   Past Medical History:  Past Medical History:  Diagnosis Date  . 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 Headache(784.0)    migraines  . Hypertension   . Infertility of tubal origin   . Polycystic ovarian syndrome   . Right tubal pregnancy without intrauterine pregnancy 06/19/2017   05/29/17: single dose mtx given    Past Surgical History:  Past Surgical History:  Procedure Laterality Date  . CESAREAN SECTION  06/26/2011   Procedure: CESAREAN SECTION;  Surgeon: Catalina AntiguaPeggy Constant, MD;  Location: [facility];  Service: Gynecology;  Laterality: N/A;  . DIAGNOSTIC LAPAROSCOPY  07/24/2010   REMOVAL OF BILATERAL TUBES BLOCKAGE  . GASTRIC BYPASS  2004    Past Obstetrical History:  OB History  Gravida Para Term Preterm AB Living  2 1 0 1 1 2   SAB TAB Ectopic Multiple Live Births  0 0 1 1 2     # Outcome Date GA Lbr Len/2nd Weight Sex Delivery Anes PTL Lv  2 Ectopic 06/2017          1A Preterm 06/26/11 7656w5d  1 lb 15 oz (0.88 kg) F CS-LTranv Spinal  LIV  1B Preterm 06/26/11 2656w5d  1 lb 6.2  oz (0.63 kg) F CS-LTranv Spinal  LIV    Obstetric Comments  06/2017: rt tubal ectopic. MTx used    Past Gynecological History: As per HPI. Periods: none History of Pap Smear(s): Yes.   Last pap 10/2015, which was negative She is currently using IUD for contraception.   Social History:  Social History   Socioeconomic History  . Marital status: Married    Spouse name: Not on file  . Number of children: Not on file  . Years of education: Not on file  . Highest education level: Not on file  Occupational History  . Not on file  Social Needs  . Financial resource strain: Not on file  . Food insecurity    Worry: Not on file    Inability: Not on file  . Transportation needs    Medical: Not on file    Non-medical: Not on file  Tobacco Use  . Smoking status: Never Smoker  . Smokeless tobacco: Never Used  Substance and Sexual Activity  . Alcohol use: Yes    Comment: SOCIALLY  . Drug use: No  . Sexual activity: Yes    Birth control/protection: None  Lifestyle  . Physical activity    Days per week: Not on file    Minutes per session: Not on file  . Stress: Not on file  Relationships  .  Social Herbalist on phone: Not on file    Gets together: Not on file    Attends religious service: Not on file    Active member of club or organization: Not on file    Attends meetings of clubs or organizations: Not on file    Relationship status: Not on file  . Intimate partner violence    Fear of current or ex partner: Not on file    Emotionally abused: Not on file    Physically abused: Not on file    Forced sexual activity: Not on file  Other Topics Concern  . Not on file  Social History Narrative  . Not on file    Family History:  Family History  Problem Relation Age of Onset  . Asthma Mother   . Diabetes Mother   . Arthritis Mother   . Fibromyalgia Mother   . Hypertension Father   . Diabetes Father   . Hypertension Maternal Grandmother   . Arthritis Maternal  Grandmother     Medications Rance Muir had no medications administered during this visit. Current Outpatient Medications  Medication Sig Dispense Refill  . levonorgestrel (MIRENA) 20 MCG/24HR IUD 1 each by Intrauterine route once.     No current facility-administered medications for this visit.     Allergies Shellfish allergy   Physical Exam:  BP 133/88   Pulse (!) 106   Ht 5\' 6"  (1.676 m)   Wt 267 lb (121.1 kg)   LMP 04/11/2018   BMI 43.09 kg/m  Body mass index is 43.09 kg/m.  General appearance: Well nourished, well developed female in no acute distress.  Neck:  Supple, normal appearance, and no thyromegaly  Cardiovascular: normal s1 and s2.  No murmurs, rubs or gallops. Respiratory:  Clear to auscultation bilateral. Normal respiratory effort Abdomen: positive bowel sounds and no masses, hernias; diffusely non tender to palpation, non distended Breasts: breasts appear normal, no suspicious masses, no skin or nipple changes or axillary nodes, and negative palpation. Neuro/Psych:  Normal mood and affect.  Skin:  Warm and dry.  Lymphatic:  No inguinal lymphadenopathy.   Pelvic exam: is limited by body habitus EGBUS: within normal limits, Vagina: within normal limits and with no blood or discharge in the vault, Cervix: normal appearing cervix without tenderness, discharge or lesions. Uterus:  nonenlarged and non tender and Adnexa:  normal adnexa and no mass, fullness, tenderness Rectovaginal: deferred  See procedure note for uncomplicated Mirena removal  Laboratory: None  Radiology: None  Assessment: pt stable  Plan:  1. Well woman exam Routine care. Information re: PCPs in the area given. Will check cmp first before low dose metformin.  Plan B given.  BC options d/w pt - Comprehensive metabolic panel - CBC - Hemoglobin A1c - Beta hCG quant (ref lab) - TSH - Lipid panel - VITAMIN D 25 Hydroxy (Vit-D Deficiency, Fractures)  2. Amenorrhea - Beta hCG quant  (ref lab)  Orders Placed This Encounter  Procedures  . Comprehensive metabolic panel  . CBC  . Hemoglobin A1c  . Beta hCG quant (ref lab)  . TSH  . Lipid panel  . VITAMIN D 25 Hydroxy (Vit-D Deficiency, Fractures)    RTC 3 months  Durene Romans MD Attending Center for [facility])

## 2018-08-05 NOTE — Patient Instructions (Signed)
[facility] 

## 2018-08-05 NOTE — Progress Notes (Signed)
Here today for well woman exam.  She would like to have her IUD removed and discuss having a tubal ligation.  Her IUD causes her some discomfort.  Has questions about need for metformin due to PCOS symptoms. She does not have a PCP.    Pap order needs to go to [facility].

## 2018-08-06 LAB — COMPREHENSIVE METABOLIC PANEL
ALT: 16 IU/L (ref 0–32)
AST: 18 IU/L (ref 0–40)
Albumin/Globulin Ratio: 1.4 (ref 1.2–2.2)
Albumin: 4.2 g/dL (ref 3.8–4.8)
Alkaline Phosphatase: 79 IU/L (ref 39–117)
BUN/Creatinine Ratio: 19 (ref 9–23)
BUN: 11 mg/dL (ref 6–20)
Bilirubin Total: 0.5 mg/dL (ref 0.0–1.2)
CO2: 22 mmol/L (ref 20–29)
Calcium: 8.9 mg/dL (ref 8.7–10.2)
Chloride: 100 mmol/L (ref 96–106)
Creatinine, Ser: 0.57 mg/dL (ref 0.57–1.00)
GFR calc Af Amer: 137 mL/min/{1.73_m2} (ref >59)
GFR calc non Af Amer: 119 mL/min/{1.73_m2} (ref >59)
Globulin, Total: 2.9 g/dL (ref 1.5–4.5)
Glucose: 84 mg/dL (ref 65–99)
Potassium: 4.2 mmol/L (ref 3.5–5.2)
Sodium: 137 mmol/L (ref 134–144)
Total Protein: 7.1 g/dL (ref 6.0–8.5)

## 2018-08-06 LAB — LIPID PANEL
Chol/HDL Ratio: 2.2 ratio (ref 0.0–4.4)
Cholesterol, Total: 130 mg/dL (ref 100–199)
HDL: 60 mg/dL (ref >39)
LDL Calculated: 61 mg/dL (ref 0–99)
Triglycerides: 44 mg/dL (ref 0–149)
VLDL Cholesterol Cal: 9 mg/dL (ref 5–40)

## 2018-08-06 LAB — VITAMIN D 25 HYDROXY (VIT D DEFICIENCY, FRACTURES): Vit D, 25-Hydroxy: 9.2 ng/mL — ABNORMAL LOW (ref 30.0–100.0)

## 2018-08-06 LAB — HEMOGLOBIN A1C
Est. average glucose Bld gHb Est-mCnc: 114 mg/dL
Hgb A1c MFr Bld: 5.6 % (ref 4.8–5.6)

## 2018-08-06 LAB — TSH: TSH: 1.82 u[IU]/mL (ref 0.450–4.500)

## 2018-08-06 LAB — CBC
Hematocrit: 36.5 % (ref 34.0–46.6)
Hemoglobin: 10.3 g/dL — ABNORMAL LOW (ref 11.1–15.9)
MCH: 18 pg — ABNORMAL LOW (ref 26.6–33.0)
MCHC: 28.2 g/dL — ABNORMAL LOW (ref 31.5–35.7)
MCV: 64 fL — ABNORMAL LOW (ref 79–97)
Platelets: 399 10*3/uL (ref 150–450)
RBC: 5.71 x10E6/uL — ABNORMAL HIGH (ref 3.77–5.28)
RDW: 20.4 % — ABNORMAL HIGH (ref 11.7–15.4)
WBC: 7.8 10*3/uL (ref 3.4–10.8)

## 2018-08-06 LAB — BETA HCG QUANT (REF LAB): hCG Quant: <1 m[IU]/mL

## 2018-08-06 NOTE — Procedures (Signed)
Intrauterine Device (IUD) Removal Procedure Note  Prior to the procedure being performed, the patient (or guardian) was asked to state their full name, date of birth, and the type of procedure being performed. EGBUS normal. Vaginal vault normal. Cervix normal with IUD strings seen (approx 3cm in length). Strings grasped with ringed forceps and easily removed and noted to be intact.   No complications, patient tolerated the procedure well.  Durene Romans MD Attending Center for [facility] (Faculty Practice) 08/06/2018

## 2018-08-09 NOTE — Addendum Note (Signed)
Addended by: Aletha Halim on: 08/09/2018 09:49 AM   Modules accepted: Orders

## 2018-08-17 ENCOUNTER — Telehealth: Payer: Self-pay

## 2018-08-17 NOTE — Telephone Encounter (Signed)
Would you like to start patient on vitamin d supplements?

## 2018-08-25 ENCOUNTER — Encounter: Payer: Self-pay | Admitting: Advanced Practice Midwife

## 2018-08-25 ENCOUNTER — Telehealth (INDEPENDENT_AMBULATORY_CARE_PROVIDER_SITE_OTHER): Payer: Managed Care, Other (non HMO) | Admitting: Advanced Practice Midwife

## 2018-08-25 ENCOUNTER — Other Ambulatory Visit: Payer: Self-pay

## 2018-08-25 DIAGNOSIS — N926 Irregular menstruation, unspecified: Secondary | ICD-10-CM

## 2018-08-25 DIAGNOSIS — Z920 Personal history of contraception: Secondary | ICD-10-CM | POA: Diagnosis not present

## 2018-08-25 NOTE — Progress Notes (Addendum)
    TELEHEALTH GYNECOLOGY VIRTUAL VIDEO VISIT ENCOUNTER NOTE  Provider location: [facility] [place] [facility]   I connected with Rance Muir on 08/25/18 at  3:45 PM EDT by MyChart Video Encounter at home and verified that I am speaking with the correct person using two identifiers.   I discussed the limitations, risks, security and privacy concerns of performing an evaluation and management service virtually and the availability of in person appointments. I also discussed with the patient that there may be a patient responsible charge related to this service. The patient expressed understanding and agreed to proceed.   History:  Marilyn Wilkinson is a 38 y.o. [phone] female being evaluated today for irregular bleeding following IUD removal 08/05/18. She states she was inconvenienced by irregular bleeding but her symptoms resolved without intervention two days ago. She denies any abnormal vaginal discharge, bleeding, pelvic pain or other concerns.     Patient has a recurring interest in a hysterectomy. She states she mentioned this during her IUD removal and was encouraged to "weigh pros and cons" and experience 3-4 menstrual cycles before scheduling a visit to discuss next steps.   Past Medical History:  Diagnosis Date  . 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 Headache(784.0)    migraines  . Hypertension   . Infertility of tubal origin   . Polycystic ovarian syndrome   . Right tubal pregnancy without intrauterine pregnancy 06/19/2017   05/29/17: single dose mtx given   Past Surgical History:  Procedure Laterality Date  . CESAREAN SECTION  06/26/2011   Procedure: CESAREAN SECTION;  Surgeon: Mora Bellman, MD;  Location: [facility];  Service: Gynecology;  Laterality: N/A;  . DIAGNOSTIC LAPAROSCOPY  07/24/2010   REMOVAL OF BILATERAL TUBES BLOCKAGE  . GASTRIC BYPASS  2004   The following portions of the patient's history were reviewed and updated as  appropriate: allergies, current medications, past family history, past medical history, past social history, past surgical history and problem list.   Health Maintenance:  Normal pap 2017.    Review of Systems:  Pertinent items noted in HPI and remainder of comprehensive ROS otherwise negative.  Physical Exam:   General:  Alert, oriented and cooperative. Patient appears to be in no acute distress.  Mental Status: Normal mood and affect. Normal behavior. Normal judgment and thought content.   Respiratory: Normal respiratory effort, no problems with respiration noted  Rest of physical exam deferred due to type of encounter  Labs and Imaging No results found for this or any previous visit (from the past 336 hour(s)). No results found.     Assessment and Plan:     1. Irregular bleeding - Likely return of cycle following IUD removal  2. History of use of contraceptive intrauterine device (IUD)   I discussed the assessment and treatment plan with the patient. The patient was provided an opportunity to ask questions and all were answered. The patient agreed with the plan and demonstrated an understanding of the instructions.   The patient was advised to call back or seek an in-person evaluation/go to the ED if the symptoms worsen or if the condition fails to improve as anticipated.  I provided 15 minutes of face-to-face time during this encounter.  Mallie Snooks, MSN, CNM Certified Nurse Midwife, [facility] for [employer] [employer] [facility] 08/25/18 6:05 PM

## 2018-08-25 NOTE — Patient Instructions (Signed)
Anemia  Anemia is a condition in which you do not have enough red blood cells or hemoglobin. Hemoglobin is a substance in red blood cells that carries oxygen. When you do not have enough red blood cells or hemoglobin (are anemic), your body cannot get enough oxygen and your organs may not work properly. As a result, you may feel very tired or have other problems. What are the causes? Common causes of anemia include:  Excessive bleeding. Anemia can be caused by excessive bleeding inside or outside the body, including bleeding from the intestine or from periods in women.  Poor nutrition.  Long-lasting (chronic) kidney, thyroid, and liver disease.  Bone marrow disorders.  Cancer and treatments for cancer.  HIV (human immunodeficiency virus) and AIDS (acquired immunodeficiency syndrome).  Treatments for HIV and AIDS.  Spleen problems.  Blood disorders.  Infections, medicines, and autoimmune disorders that destroy red blood cells. What are the signs or symptoms? Symptoms of this condition include:  Minor weakness.  Dizziness.  Headache.  Feeling heartbeats that are irregular or faster than normal (palpitations).  Shortness of breath, especially with exercise.  Paleness.  Cold sensitivity.  Indigestion.  Nausea.  Difficulty sleeping.  Difficulty concentrating. Symptoms may occur suddenly or develop slowly. If your anemia is mild, you may not have symptoms. How is this diagnosed? This condition is diagnosed based on:  Blood tests.  Your medical history.  A physical exam.  Bone marrow biopsy. Your health care provider may also check your stool (feces) for blood and may do additional testing to look for the cause of your bleeding. You may also have other tests, including:  Imaging tests, such as a CT scan or MRI.  Endoscopy.  Colonoscopy. How is this treated? Treatment for this condition depends on the cause. If you continue to lose a lot of blood, you may  need to be treated at a hospital. Treatment may include:  Taking supplements of iron, vitamin S31, or folic acid.  Taking a hormone medicine (erythropoietin) that can help to stimulate red blood cell growth.  Having a blood transfusion. This may be needed if you lose a lot of blood.  Making changes to your diet.  Having surgery to remove your spleen. Follow these instructions at home:  Take over-the-counter and prescription medicines only as told by your health care provider.  Take supplements only as told by your health care provider.  Follow any diet instructions that you were given.  Keep all follow-up visits as told by your health care provider. This is important. Contact a health care provider if:  You develop new bleeding anywhere in the body. Get help right away if:  You are very weak.  You are short of breath.  You have pain in your abdomen or chest.  You are dizzy or feel faint.  You have trouble concentrating.  You have bloody or black, tarry stools.  You vomit repeatedly or you vomit up blood. Summary  Anemia is a condition in which you do not have enough red blood cells or enough of a substance in your red blood cells that carries oxygen (hemoglobin).  Symptoms may occur suddenly or develop slowly.  If your anemia is mild, you may not have symptoms.  This condition is diagnosed with blood tests as well as a medical history and physical exam. Other tests may be needed.  Treatment for this condition depends on the cause of the anemia. This information is not intended to replace advice given to you by  your health care provider. Make sure you discuss any questions you have with your health care provider. Document Released: 03/06/2004 Document Revised: 01/09/2017 Document Reviewed: 02/29/2016 Elsevier Patient Education  2020 [employer].

## 2018-08-27 ENCOUNTER — Encounter: Payer: Self-pay | Admitting: Obstetrics and Gynecology

## 2018-08-27 DIAGNOSIS — R7989 Other specified abnormal findings of blood chemistry: Secondary | ICD-10-CM | POA: Insufficient documentation

## 2018-08-27 DIAGNOSIS — D649 Anemia, unspecified: Secondary | ICD-10-CM | POA: Insufficient documentation

## 2018-08-27 MED ORDER — IRON POLYSACCH CMPLX-B12-FA 150-0.025-1 MG PO CAPS: 1.0000 | ORAL_CAPSULE | Freq: Every day | ORAL | 0 refills | Status: AC

## 2018-08-27 MED ORDER — VITAMIN D (ERGOCALCIFEROL) 1.25 MG (50000 UNIT) PO CAPS: 50000.0000 [IU] | ORAL_CAPSULE | ORAL | 0 refills | Status: DC

## 2018-08-27 NOTE — Addendum Note (Signed)
Addended by: Aletha Halim on: 08/27/2018 02:34 PM   Modules accepted: Orders

## 2018-10-14 ENCOUNTER — Ambulatory Visit (INDEPENDENT_AMBULATORY_CARE_PROVIDER_SITE_OTHER): Payer: Managed Care, Other (non HMO) | Admitting: Obstetrics and Gynecology

## 2018-10-14 ENCOUNTER — Other Ambulatory Visit: Payer: Self-pay

## 2018-10-14 ENCOUNTER — Encounter: Payer: Self-pay | Admitting: Obstetrics and Gynecology

## 2018-10-14 VITALS — BP 142/97 | HR 90 | Wt 278.2 lb

## 2018-10-14 DIAGNOSIS — N920 Excessive and frequent menstruation with regular cycle: Secondary | ICD-10-CM

## 2018-10-14 DIAGNOSIS — R030 Elevated blood-pressure reading, without diagnosis of hypertension: Secondary | ICD-10-CM

## 2018-10-14 DIAGNOSIS — N946 Dysmenorrhea, unspecified: Secondary | ICD-10-CM

## 2018-10-14 HISTORY — DX: Elevated blood-pressure reading, without diagnosis of hypertension: R03.0

## 2018-10-14 MED ORDER — METFORMIN HCL 500 MG PO TABS: ORAL_TABLET | ORAL | 2 refills | Status: DC

## 2018-10-14 NOTE — Patient Instructions (Signed)
[facility]

## 2018-10-14 NOTE — Progress Notes (Signed)
Obstetrics and Gynecology Established Patient Evaluation  Appointment Date: 10/14/2018  OB[facility]  Primary Care Provider: Leotis ShamesSingh, Jasmine  Chief Complaint:  Chief Complaint  Patient presents with  . Discuss Hyst    History of Present Illness: Shon HoughFiona F Grilliot is a 38 y.o. African-American [phone] (Patient's last menstrual period was 09/17/2018 (approximate).), seen for the above chief complaint. Her past medical history is significant for HTN, gastric bypass, laparoscopic tubal surgery, c-section, BMI 40s.  Patient menorrhagia and dysmenorrhea. She tried the Mirena but no effect and it was removed. Past two menses after removal have had period <1wk and heavy and painful.    Review of Systems: as noted in the History of Present Illness.  Patient Active Problem List   Diagnosis Date Noted  . Transient hypertension 10/14/2018  . Dysmenorrhea 10/14/2018  . Menorrhagia with regular cycle 10/14/2018  . Low serum vitamin D 08/27/2018  . Anemia 08/27/2018  . BMI 45.0-49.9, adult (HCC) 05/25/2017  . 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 PCOS (polycystic ovarian syndrome) 03/03/2011    Past Medical History:  Past Medical History:  Diagnosis Date  . 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 Headache(784.0)    migraines  . Hypertension   . Infertility of tubal origin   . Polycystic ovarian syndrome   . Right tubal pregnancy without intrauterine pregnancy 06/19/2017   05/29/17: single dose mtx given    Past Surgical History:  Past Surgical History:  Procedure Laterality Date  . CESAREAN SECTION  06/26/2011   Procedure: CESAREAN SECTION;  Surgeon: Catalina AntiguaPeggy Constant, MD;  Location: [facility];  Service: Gynecology;  Laterality: N/A;  . DIAGNOSTIC LAPAROSCOPY  07/24/2010   REMOVAL OF BILATERAL TUBES BLOCKAGE  . GASTRIC BYPASS  2004    Past Obstetrical History:  OB History  Gravida Para Term Preterm AB Living  2 1 0 1 1 2   SAB TAB  Ectopic Multiple Live Births  0 0 1 1 2     # Outcome Date GA Lbr Len/2nd Weight Sex Delivery Anes PTL Lv  2 Ectopic 06/2017          1A Preterm 06/26/11 3715w5d  1 lb 15 oz (0.88 kg) F CS-LTranv Spinal  LIV  1B Preterm 06/26/11 3415w5d  1 lb 6.2 oz (0.63 kg) F CS-LTranv Spinal  LIV    Obstetric Comments  06/2017: rt tubal ectopic. MTx used    Past Gynecological History: As per HPI. History of Pap Smear(s): Yes.   Last pap 2017, which was negative She is currently using no method for contraception.   Social History:  Social History   Socioeconomic History  . Marital status: Married    Spouse name: Not on file  . Number of children: Not on file  . Years of education: Not on file  . Highest education level: Not on file  Occupational History  . Not on file  Social Needs  . Financial resource strain: Not on file  . Food insecurity    Worry: Not on file    Inability: Not on file  . Transportation needs    Medical: Not on file    Non-medical: Not on file  Tobacco Use  . Smoking status: Never Smoker  . Smokeless tobacco: Never Used  Substance and Sexual Activity  . Alcohol use: Yes    Comment: SOCIALLY  . Drug use: No  . Sexual activity: Yes    Birth control/protection: None  Lifestyle  . Physical  activity    Days per week: Not on file    Minutes per session: Not on file  . Stress: Not on file  Relationships  . Social Musician on phone: Not on file    Gets together: Not on file    Attends religious service: Not on file    Active member of club or organization: Not on file    Attends meetings of clubs or organizations: Not on file    Relationship status: Not on file  . Intimate partner violence    Fear of current or ex partner: Not on file    Emotionally abused: Not on file    Physically abused: Not on file    Forced sexual activity: Not on file  Other Topics Concern  . Not on file  Social History Narrative  . Not on file    Family History:  Family  History  Problem Relation Age of Onset  . Asthma Mother   . Diabetes Mother   . Arthritis Mother   . Fibromyalgia Mother   . Hypertension Father   . Diabetes Father   . Hypertension Maternal Grandmother   . Arthritis Maternal Grandmother      Medications [name] had no medications administered during this visit. Current Outpatient Medications  Medication Sig Dispense Refill  . ferrous sulfate 325 (65 FE) MG tablet Take 325 mg by mouth daily with breakfast.    . Vitamin D, Ergocalciferol, (DRISDOL) 1.25 MG (50000 UT) CAPS capsule Take 1 capsule (50,000 Units total) by mouth every 7 (seven) days. 12 capsule 0  . CONTRAVE 8-90 MG TB12     . metFORMIN (GLUCOPHAGE) 500 MG tablet 1 tab po with breakfast x 1 week. Then 1 tab po with breakfast and dinner 60 tablet 2   No current facility-administered medications for this visit.     Allergies Shellfish allergy   Physical Exam:  Initial BP 146/101 BP (!) 142/97   Pulse 90   Wt 278 lb 3.2 oz (126.2 kg)   LMP 09/17/2018 (Approximate)   BMI 44.90 kg/m  Body mass index is 44.9 kg/m. General appearance: Well nourished, well developed female in no acute distress.   Laboratory: no new labs  Radiology:  CLINICAL DATA:  Vaginal bleeding  EXAM: TRANSVAGINAL OB ULTRASOUND  TECHNIQUE: Transvaginal ultrasound was performed for complete evaluation of the gestation as well as the maternal uterus, adnexal regions, and pelvic cul-de-sac.  COMPARISON:  05/26/2017  FINDINGS: Intrauterine gestational sac: None visualized  Yolk sac:  None visualized  Embryo:  None visualized  Cardiac Activity: N/A  Heart Rate:  bpm  MSD:   mm    w     d  CRL:     mm    w  d                  [place] EDC:  Subchorionic hemorrhage:  N/A  Maternal uterus/adnexae: There is a solid-appearing area adjacent to the right ovary concerning for right adnexal mass measuring 1.9 x 1.5 x 1.3 cm. This appears separate from the right ovary  with internal blood flow. Cannot exclude early ectopic pregnancy. Trace free fluid in the pelvis.  IMPRESSION: Concern for small mass adjacent to the right ovary measuring 1.9 x 1.5 x 1.3 cm concerning for early ectopic pregnancy.  These results were called by telephone at the time of interpretation on 05/29/2017 at 11:21 am to Dr. Cleone Slim , who verbally acknowledged these results.  Electronically Signed   By: Rolm Baptise M.D.   On: 05/29/2017 11:23  Assessment: pt stable  Plan:  *Dysmenorrhea, menorrhagia: options d/w her and I told her that she could try something else for medical management, but at this point I'd recommend something surgical.  Options for ablation vs hyst were d/w her and she'd like to do hyst since it is definitive treatment. She is aware that both options precludes pregnancy and she has satisfied fertility.  Pt would like to proceed with hyst. I told her that I'd recommend doing it laparoscopically with risk of conversion to open d/w her given her surgical history. Recommend she restart qday iron with PRN stool softener.   Will send request for TLH/BS/cysto  RTC post op  Durene Romans MD Attending Center for [facility])

## 2018-10-21 ENCOUNTER — Ambulatory Visit: Payer: Managed Care, Other (non HMO) | Admitting: *Deleted

## 2018-10-21 ENCOUNTER — Other Ambulatory Visit: Payer: Self-pay

## 2018-10-21 VITALS — BP 124/87 | HR 102

## 2018-10-21 DIAGNOSIS — R030 Elevated blood-pressure reading, without diagnosis of hypertension: Secondary | ICD-10-CM

## 2018-10-21 NOTE — Progress Notes (Signed)
Patient presented today for blood pressure check. BP is 124/87. Patient was told to follow up with Dr. Ilda Basset. Patient does not complain of headache at this time.

## 2018-11-26 ENCOUNTER — Telehealth: Payer: Self-pay

## 2018-11-26 NOTE — Telephone Encounter (Signed)
fmla form completed

## 2018-12-06 ENCOUNTER — Encounter: Payer: Self-pay | Admitting: *Deleted

## 2018-12-22 ENCOUNTER — Other Ambulatory Visit: Payer: Self-pay | Admitting: Obstetrics and Gynecology

## 2018-12-24 NOTE — Progress Notes (Signed)
[id number] [place], [place] - [address] [place] [address] Phone: [phone] Fax: [phone]      Your procedure is scheduled on Wednesday, November 18th, 2020.   Report to [facility] Main Entrance "A" at 11:00 A.M., and check in at the Admitting office.   Call this number if you have problems the morning of surgery:  [phone]  Call [phone] if you have any questions prior to your surgery date Monday-Friday 8am-4pm    Remember:  Do not eat or drink after midnight the night before your surgery  You may drink clear liquids until 10:00AM the morning of your surgery.    Clear liquids allowed are: Water, Non-Citrus Juices (without pulp), Carbonated Beverages, Clear Tea, Black Coffee Only, and Gatorade    Take these medicines the morning of surgery with A SIP OF WATER :  If Needed: Triamcinolone (Nasacort Allergy 24Hrs) nasal inhaler  7 days prior to surgery STOP taking any Aspirin (unless otherwise instructed by your surgeon), Aleve, Naproxen, Ibuprofen, Motrin, Advil, Goody's, BC's, all herbal medications, fish oil, and all vitamins.    The Morning of Surgery  Do not wear jewelry, make-up or nail polish.  Do not wear lotions, powders, or perfumes/colognes, or deodorant  Do not shave 48 hours prior to surgery.  Men may shave face and neck.  Do not bring valuables to the hospital.  [facility] is not responsible for any belongings or valuables.  If you are a smoker, DO NOT Smoke 24 hours prior to surgery IF you wear a CPAP at night please bring your mask, tubing, and machine the morning of surgery   Remember that you must have someone to transport you home after your surgery, and remain with you for 24 hours if you are discharged the same day.   Contacts, glasses, hearing aids, dentures or bridgework may not be worn into surgery.    Leave your suitcase in the car.  After surgery it may be brought to your room.  For  patients admitted to the hospital, discharge time will be determined by your treatment team.  Patients discharged the day of surgery will not be allowed to drive home.    Special instructions:   [facility]- Preparing For Surgery  Before surgery, you can play an important role. Because skin is not sterile, your skin needs to be as free of germs as possible. You can reduce the number of germs on your skin by washing with CHG (chlorahexidine gluconate) Soap before surgery.  CHG is an antiseptic cleaner which kills germs and bonds with the skin to continue killing germs even after washing.    Oral Hygiene is also important to reduce your risk of infection.  Remember - BRUSH YOUR TEETH THE MORNING OF SURGERY WITH YOUR REGULAR TOOTHPASTE  Please do not use if you have an allergy to CHG or antibacterial soaps. If your skin becomes reddened/irritated stop using the CHG.  Do not shave (including legs and underarms) for at least 48 hours prior to first CHG shower. It is OK to shave your face.  Please follow these instructions carefully.   1. Shower the NIGHT BEFORE SURGERY and the MORNING OF SURGERY with CHG Soap.   2. If you chose to wash your hair, wash your hair first as usual with your normal shampoo.  3. After you shampoo, rinse your hair and body thoroughly to remove the shampoo.  4. Use CHG as you would any other liquid soap. You  can apply CHG directly to the skin and wash gently with a scrungie or a clean washcloth.   5. Apply the CHG Soap to your body ONLY FROM THE NECK DOWN.  Do not use on open wounds or open sores. Avoid contact with your eyes, ears, mouth and genitals (private parts). Wash Face and genitals (private parts)  with your normal soap.   6. Wash thoroughly, paying special attention to the area where your surgery will be performed.  7. Thoroughly rinse your body with warm water from the neck down.  8. DO NOT shower/wash with your normal soap after using and rinsing off the  CHG Soap.  9. Pat yourself dry with a CLEAN TOWEL.  10. Wear CLEAN PAJAMAS to bed the night before surgery, wear comfortable clothes the morning of surgery  11. Place CLEAN SHEETS on your bed the night of your first shower and DO NOT SLEEP WITH PETS.    Day of Surgery:  Do not apply any deodorants/lotions. Please shower the morning of surgery with the CHG soap  Please wear clean clothes to the hospital/surgery center.   Remember to brush your teeth WITH YOUR REGULAR TOOTHPASTE.   Please read over the following fact sheets that you were given.

## 2018-12-27 ENCOUNTER — Other Ambulatory Visit: Payer: Self-pay

## 2018-12-27 ENCOUNTER — Encounter (HOSPITAL_COMMUNITY)
Admission: RE | Admit: 2018-12-27 | Discharge: 2018-12-27 | Disposition: A | Discharge status: Home / Self Care | Payer: Managed Care, Other (non HMO) | Source: Ambulatory Visit | Attending: Obstetrics and Gynecology | Admitting: Obstetrics and Gynecology

## 2018-12-27 ENCOUNTER — Encounter (HOSPITAL_COMMUNITY): Payer: Self-pay

## 2018-12-27 ENCOUNTER — Other Ambulatory Visit (HOSPITAL_COMMUNITY)
Admission: RE | Admit: 2018-12-27 | Discharge: 2018-12-27 | Disposition: A | Discharge status: Home / Self Care | Payer: Managed Care, Other (non HMO) | Source: Ambulatory Visit | Attending: Obstetrics and Gynecology | Admitting: Obstetrics and Gynecology

## 2018-12-27 DIAGNOSIS — N939 Abnormal uterine and vaginal bleeding, unspecified: Secondary | ICD-10-CM | POA: Diagnosis not present

## 2018-12-27 DIAGNOSIS — Z20828 Contact with and (suspected) exposure to other viral communicable diseases: Secondary | ICD-10-CM | POA: Diagnosis not present

## 2018-12-27 DIAGNOSIS — Z01812 Encounter for preprocedural laboratory examination: Secondary | ICD-10-CM | POA: Insufficient documentation

## 2018-12-27 HISTORY — DX: Anemia, unspecified: D64.9

## 2018-12-27 LAB — CBC
HCT: 33.7 % — ABNORMAL LOW (ref 36.0–46.0)
Hemoglobin: 9.1 g/dL — ABNORMAL LOW (ref 12.0–15.0)
MCH: 17 pg — ABNORMAL LOW (ref 26.0–34.0)
MCHC: 27 g/dL — ABNORMAL LOW (ref 30.0–36.0)
MCV: 63 fL — ABNORMAL LOW (ref 80.0–100.0)
Platelets: 307 10*3/uL (ref 150–400)
RBC: 5.35 MIL/uL — ABNORMAL HIGH (ref 3.87–5.11)
RDW: 21.1 % — ABNORMAL HIGH (ref 11.5–15.5)
WBC: 7.3 10*3/uL (ref 4.0–10.5)
nRBC: 0 % (ref 0.0–0.2)

## 2018-12-27 LAB — TYPE AND SCREEN
ABO/RH(D): O POS
Antibody Screen: NEGATIVE

## 2018-12-27 LAB — ABO/RH: ABO/RH(D): O POS

## 2018-12-27 LAB — SARS CORONAVIRUS 2 (TAT 6-24 HRS): SARS Coronavirus 2: NEGATIVE

## 2018-12-27 NOTE — Progress Notes (Signed)
BMET- appears to not be resulting, spoke with Aaron Edelman in the Main LAB, tube is not found.  Spoke with PAT lab, will request BMET for a.m. of surgery.

## 2018-12-27 NOTE — Progress Notes (Signed)
PCP - International aid/development worker - denies   PPM/ICD - denies  Device Orders -  Rep Notified -   Chest x-ray - n/a EKG - n/a Stress Test - denies  ECHO - denies  Cardiac Cath - denies   Sleep Study - prior to gastric bypass, no longer needs CPAP, used only for a brief period of time CPAP -   Fasting Blood Sugar - n/a Checks Blood Sugar _____ times a day  Blood Thinner Instructions: Aspirin Instructions:  ERAS Protcol - pt. Instructed on clear liquids only after 12 mn & to stop all by 1000 dos PRE-SURGERY Ensure or G2- n/a  COVID TEST- today   Anesthesia review:   Patient denies shortness of breath, fever, cough and chest pain at PAT appointment   All instructions explained to the patient, with a verbal understanding of the material. Patient agrees to go over the instructions while at home for a better understanding. Patient also instructed to self quarantine after being tested for COVID-19. The opportunity to ask questions was provided.

## 2018-12-28 MED ORDER — DEXTROSE 5 % IV SOLN
3.0000 g | INTRAVENOUS | Status: AC
  Administered 2018-12-29: 3 g via INTRAVENOUS
  Filled 2018-12-28: qty 3

## 2018-12-29 ENCOUNTER — Encounter (HOSPITAL_COMMUNITY)
Admission: RE | Disposition: A | Discharge status: Home / Self Care | Payer: Self-pay | Source: Home / Self Care | Attending: Obstetrics and Gynecology

## 2018-12-29 ENCOUNTER — Other Ambulatory Visit: Payer: Self-pay

## 2018-12-29 ENCOUNTER — Encounter (HOSPITAL_COMMUNITY): Payer: Self-pay

## 2018-12-29 ENCOUNTER — Observation Stay (HOSPITAL_COMMUNITY)
Admission: RE | Admit: 2018-12-29 | Discharge: 2018-12-30 | Disposition: A | Discharge status: Home / Self Care | Payer: Managed Care, Other (non HMO) | Attending: Obstetrics and Gynecology | Admitting: Obstetrics and Gynecology

## 2018-12-29 ENCOUNTER — Inpatient Hospital Stay (HOSPITAL_COMMUNITY): Payer: Managed Care, Other (non HMO) | Admitting: Certified Registered"

## 2018-12-29 DIAGNOSIS — I10 Essential (primary) hypertension: Secondary | ICD-10-CM | POA: Diagnosis not present

## 2018-12-29 DIAGNOSIS — G4730 Sleep apnea, unspecified: Secondary | ICD-10-CM | POA: Insufficient documentation

## 2018-12-29 DIAGNOSIS — K660 Peritoneal adhesions (postprocedural) (postinfection): Secondary | ICD-10-CM | POA: Diagnosis not present

## 2018-12-29 DIAGNOSIS — Z90710 Acquired absence of both cervix and uterus: Secondary | ICD-10-CM

## 2018-12-29 DIAGNOSIS — E282 Polycystic ovarian syndrome: Secondary | ICD-10-CM | POA: Insufficient documentation

## 2018-12-29 DIAGNOSIS — N838 Other noninflammatory disorders of ovary, fallopian tube and broad ligament: Secondary | ICD-10-CM | POA: Insufficient documentation

## 2018-12-29 DIAGNOSIS — N920 Excessive and frequent menstruation with regular cycle: Secondary | ICD-10-CM | POA: Diagnosis present

## 2018-12-29 DIAGNOSIS — Z9884 Bariatric surgery status: Secondary | ICD-10-CM | POA: Diagnosis not present

## 2018-12-29 DIAGNOSIS — D649 Anemia, unspecified: Secondary | ICD-10-CM | POA: Diagnosis not present

## 2018-12-29 DIAGNOSIS — N946 Dysmenorrhea, unspecified: Secondary | ICD-10-CM | POA: Diagnosis present

## 2018-12-29 DIAGNOSIS — Z79899 Other long term (current) drug therapy: Secondary | ICD-10-CM | POA: Diagnosis not present

## 2018-12-29 DIAGNOSIS — N800 Endometriosis of uterus: Secondary | ICD-10-CM | POA: Diagnosis not present

## 2018-12-29 DIAGNOSIS — Z8249 Family history of ischemic heart disease and other diseases of the circulatory system: Secondary | ICD-10-CM | POA: Insufficient documentation

## 2018-12-29 DIAGNOSIS — Z91013 Allergy to seafood: Secondary | ICD-10-CM | POA: Diagnosis not present

## 2018-12-29 DIAGNOSIS — N939 Abnormal uterine and vaginal bleeding, unspecified: Secondary | ICD-10-CM | POA: Diagnosis present

## 2018-12-29 HISTORY — PX: CYSTOSCOPY: SHX5120

## 2018-12-29 HISTORY — PX: TOTAL LAPAROSCOPIC HYSTERECTOMY WITH SALPINGECTOMY: SHX6742

## 2018-12-29 HISTORY — DX: Acquired absence of both cervix and uterus: Z90.710

## 2018-12-29 LAB — BASIC METABOLIC PANEL
Anion gap: 7 (ref 5–15)
BUN: 11 mg/dL (ref 6–20)
CO2: 22 mmol/L (ref 22–32)
Calcium: 8.7 mg/dL — ABNORMAL LOW (ref 8.9–10.3)
Chloride: 109 mmol/L (ref 98–111)
Creatinine, Ser: 0.61 mg/dL (ref 0.44–1.00)
GFR calc Af Amer: >60 mL/min (ref >60)
GFR calc non Af Amer: >60 mL/min (ref >60)
Glucose, Bld: 75 mg/dL (ref 70–99)
Potassium: 3.8 mmol/L (ref 3.5–5.1)
Sodium: 138 mmol/L (ref 135–145)

## 2018-12-29 LAB — POCT PREGNANCY, URINE: Preg Test, Ur: NEGATIVE

## 2018-12-29 SURGERY — HYSTERECTOMY, TOTAL, LAPAROSCOPIC, WITH SALPINGECTOMY
Anesthesia: General | Site: Bladder | Laterality: Right

## 2018-12-29 MED ORDER — ROCURONIUM BROMIDE 10 MG/ML (PF) SYRINGE
PREFILLED_SYRINGE | INTRAVENOUS | Status: DC | PRN
  Administered 2018-12-29: 50 mg via INTRAVENOUS
  Administered 2018-12-29: 30 mg via INTRAVENOUS
  Administered 2018-12-29: 50 mg via INTRAVENOUS

## 2018-12-29 MED ORDER — ACETAMINOPHEN 10 MG/ML IV SOLN
INTRAVENOUS | Status: DC | PRN
  Administered 2018-12-29: 1000 mg via INTRAVENOUS

## 2018-12-29 MED ORDER — ONDANSETRON HCL 4 MG/2ML IJ SOLN: 4.0000 mg | Freq: Four times a day (QID) | INTRAMUSCULAR | Status: DC | PRN

## 2018-12-29 MED ORDER — FENTANYL CITRATE (PF) 250 MCG/5ML IJ SOLN
INTRAMUSCULAR | Status: AC
  Filled 2018-12-29: qty 5

## 2018-12-29 MED ORDER — IBUPROFEN 600 MG PO TABS
600.0000 mg | ORAL_TABLET | Freq: Four times a day (QID) | ORAL | Status: DC
  Administered 2018-12-30 (×3): 600 mg via ORAL
  Filled 2018-12-29 (×3): qty 1

## 2018-12-29 MED ORDER — HYDROMORPHONE HCL 1 MG/ML IJ SOLN
1.0000 mg | INTRAMUSCULAR | Status: DC | PRN
  Administered 2018-12-29: 1 mg via INTRAVENOUS
  Filled 2018-12-29: qty 1

## 2018-12-29 MED ORDER — LIDOCAINE IN D5W 4-5 MG/ML-% IV SOLN
1.0000 mg/min | INTRAVENOUS | Status: AC
  Administered 2018-12-29: 25 ug/kg/min via INTRAVENOUS
  Filled 2018-12-29: qty 500

## 2018-12-29 MED ORDER — SIMETHICONE 80 MG PO CHEW
80.0000 mg | CHEWABLE_TABLET | Freq: Three times a day (TID) | ORAL | Status: DC
  Administered 2018-12-29 – 2018-12-30 (×2): 80 mg via ORAL
  Filled 2018-12-29 (×2): qty 1

## 2018-12-29 MED ORDER — MIDAZOLAM HCL 5 MG/5ML IJ SOLN
INTRAMUSCULAR | Status: DC | PRN
  Administered 2018-12-29: 2 mg via INTRAVENOUS

## 2018-12-29 MED ORDER — FLUORESCEIN SODIUM 10 % IV SOLN
INTRAVENOUS | Status: AC
  Filled 2018-12-29: qty 5

## 2018-12-29 MED ORDER — PHENYLEPHRINE HCL-NACL 10-0.9 MG/250ML-% IV SOLN
INTRAVENOUS | Status: DC | PRN
  Administered 2018-12-29: 50 ug/min via INTRAVENOUS

## 2018-12-29 MED ORDER — ACETAMINOPHEN 325 MG PO TABS: 325.0000 mg | ORAL_TABLET | Freq: Once | ORAL | Status: DC | PRN

## 2018-12-29 MED ORDER — DEXAMETHASONE SODIUM PHOSPHATE 10 MG/ML IJ SOLN
INTRAMUSCULAR | Status: DC | PRN
  Administered 2018-12-29: 10 mg via INTRAVENOUS

## 2018-12-29 MED ORDER — MIDAZOLAM HCL 2 MG/2ML IJ SOLN
INTRAMUSCULAR | Status: AC
  Filled 2018-12-29: qty 2

## 2018-12-29 MED ORDER — OXYCODONE HCL 5 MG PO TABS
5.0000 mg | ORAL_TABLET | Freq: Four times a day (QID) | ORAL | Status: DC | PRN
  Administered 2018-12-29: 10 mg via ORAL

## 2018-12-29 MED ORDER — LACTATED RINGERS IV SOLN: INTRAVENOUS | Status: DC

## 2018-12-29 MED ORDER — DEXAMETHASONE SODIUM PHOSPHATE 10 MG/ML IJ SOLN
INTRAMUSCULAR | Status: AC
  Filled 2018-12-29: qty 1

## 2018-12-29 MED ORDER — SCOPOLAMINE 1 MG/3DAYS TD PT72
MEDICATED_PATCH | TRANSDERMAL | Status: DC | PRN
  Administered 2018-12-29: 1 via TRANSDERMAL

## 2018-12-29 MED ORDER — ONDANSETRON HCL 4 MG/2ML IJ SOLN
INTRAMUSCULAR | Status: AC
  Filled 2018-12-29: qty 2

## 2018-12-29 MED ORDER — ACETAMINOPHEN 10 MG/ML IV SOLN: 1000.0000 mg | Freq: Once | INTRAVENOUS | Status: DC | PRN

## 2018-12-29 MED ORDER — LACTATED RINGERS IV SOLN: INTRAVENOUS | Status: AC

## 2018-12-29 MED ORDER — BUPIVACAINE HCL 0.5 % IJ SOLN
INTRAMUSCULAR | Status: DC | PRN
  Administered 2018-12-29: 23 mL

## 2018-12-29 MED ORDER — FENTANYL CITRATE (PF) 100 MCG/2ML IJ SOLN
INTRAMUSCULAR | Status: DC | PRN
  Administered 2018-12-29: 100 ug via INTRAVENOUS
  Administered 2018-12-29: 50 ug via INTRAVENOUS
  Administered 2018-12-29: 100 ug via INTRAVENOUS
  Administered 2018-12-29: 25 ug via INTRAVENOUS
  Administered 2018-12-29 (×2): 50 ug via INTRAVENOUS

## 2018-12-29 MED ORDER — BUPIVACAINE HCL (PF) 0.5 % IJ SOLN
INTRAMUSCULAR | Status: AC
  Filled 2018-12-29: qty 30

## 2018-12-29 MED ORDER — SODIUM CHLORIDE 0.9 % IR SOLN
Status: DC | PRN
  Administered 2018-12-29: 3000 mL

## 2018-12-29 MED ORDER — ACETAMINOPHEN 325 MG PO TABS
650.0000 mg | ORAL_TABLET | Freq: Four times a day (QID) | ORAL | Status: DC
  Administered 2018-12-30 (×2): 650 mg via ORAL
  Filled 2018-12-29 (×2): qty 2

## 2018-12-29 MED ORDER — ROCURONIUM BROMIDE 10 MG/ML (PF) SYRINGE
PREFILLED_SYRINGE | INTRAVENOUS | Status: AC
  Filled 2018-12-29: qty 20

## 2018-12-29 MED ORDER — KETOROLAC TROMETHAMINE 30 MG/ML IJ SOLN
INTRAMUSCULAR | Status: AC
  Filled 2018-12-29: qty 2

## 2018-12-29 MED ORDER — PROMETHAZINE HCL 25 MG/ML IJ SOLN: 6.2500 mg | INTRAMUSCULAR | Status: DC | PRN

## 2018-12-29 MED ORDER — ACETAMINOPHEN 160 MG/5ML PO SOLN: 325.0000 mg | Freq: Once | ORAL | Status: DC | PRN

## 2018-12-29 MED ORDER — KETAMINE HCL 50 MG/ML IJ SOLN
INTRAMUSCULAR | Status: DC | PRN
  Administered 2018-12-29: 30 mg via INTRAMUSCULAR

## 2018-12-29 MED ORDER — PROPOFOL 10 MG/ML IV BOLUS
INTRAVENOUS | Status: DC | PRN
  Administered 2018-12-29: 200 mg via INTRAVENOUS

## 2018-12-29 MED ORDER — KETOROLAC TROMETHAMINE 30 MG/ML IJ SOLN
INTRAMUSCULAR | Status: DC | PRN
  Administered 2018-12-29: 30 mg via INTRAVENOUS

## 2018-12-29 MED ORDER — ONDANSETRON HCL 4 MG PO TABS: 4.0000 mg | ORAL_TABLET | Freq: Four times a day (QID) | ORAL | Status: DC | PRN

## 2018-12-29 MED ORDER — MEPERIDINE HCL 25 MG/ML IJ SOLN: 6.2500 mg | INTRAMUSCULAR | Status: DC | PRN

## 2018-12-29 MED ORDER — KETAMINE HCL 50 MG/5ML IJ SOSY
PREFILLED_SYRINGE | INTRAMUSCULAR | Status: AC
  Filled 2018-12-29: qty 5

## 2018-12-29 MED ORDER — SCOPOLAMINE 1 MG/3DAYS TD PT72
MEDICATED_PATCH | TRANSDERMAL | Status: AC
  Filled 2018-12-29: qty 1

## 2018-12-29 MED ORDER — FENTANYL CITRATE (PF) 100 MCG/2ML IJ SOLN: 25.0000 ug | INTRAMUSCULAR | Status: DC | PRN

## 2018-12-29 MED ORDER — OXYCODONE HCL 5 MG PO TABS
ORAL_TABLET | ORAL | Status: AC
  Filled 2018-12-29: qty 2

## 2018-12-29 MED ORDER — ONDANSETRON HCL 4 MG/2ML IJ SOLN
INTRAMUSCULAR | Status: DC | PRN
  Administered 2018-12-29: 4 mg via INTRAVENOUS

## 2018-12-29 MED ORDER — POLYETHYLENE GLYCOL 3350 17 G PO PACK
17.0000 g | PACK | Freq: Every day | ORAL | Status: DC
  Administered 2018-12-30: 17 g via ORAL
  Filled 2018-12-29: qty 1

## 2018-12-29 MED ORDER — LIDOCAINE 2% (20 MG/ML) 5 ML SYRINGE
INTRAMUSCULAR | Status: AC
  Filled 2018-12-29: qty 5

## 2018-12-29 MED ORDER — GLYCOPYRROLATE PF 0.2 MG/ML IJ SOSY
PREFILLED_SYRINGE | INTRAMUSCULAR | Status: AC
  Filled 2018-12-29: qty 1

## 2018-12-29 MED ORDER — LIDOCAINE 2% (20 MG/ML) 5 ML SYRINGE
INTRAMUSCULAR | Status: DC | PRN
  Administered 2018-12-29 (×2): 100 mg via INTRAVENOUS

## 2018-12-29 MED ORDER — PANTOPRAZOLE SODIUM 40 MG IV SOLR
40.0000 mg | Freq: Every day | INTRAVENOUS | Status: DC
  Administered 2018-12-29: 40 mg via INTRAVENOUS
  Filled 2018-12-29: qty 40

## 2018-12-29 MED ORDER — LACTATED RINGERS IV SOLN
INTRAVENOUS | Status: DC
  Administered 2018-12-29: 12:00:00 via INTRAVENOUS

## 2018-12-29 SURGICAL SUPPLY — 57 items
ADH SKN CLS APL DERMABOND .7 (GAUZE/BANDAGES/DRESSINGS) ×2
APL PRP STRL LF DISP 70% ISPRP (MISCELLANEOUS) ×2
BARRIER ADHS 3X4 INTERCEED (GAUZE/BANDAGES/DRESSINGS) IMPLANT
BLADE SURG 15 STRL LF DISP TIS (BLADE) ×2 IMPLANT
BLADE SURG 15 STRL SS (BLADE) ×3
BRR ADH 4X3 ABS CNTRL BYND (GAUZE/BANDAGES/DRESSINGS)
CABLE HIGH FREQUENCY MONO STRZ (ELECTRODE) ×3 IMPLANT
CANISTER SUCT 3000ML PPV (MISCELLANEOUS) ×3 IMPLANT
CHLORAPREP W/TINT 26 (MISCELLANEOUS) ×1 IMPLANT
DECANTER SPIKE VIAL GLASS SM (MISCELLANEOUS) ×3 IMPLANT
DEFOGGER SCOPE WARMER CLEARIFY (MISCELLANEOUS) ×3 IMPLANT
DERMABOND ADVANCED (GAUZE/BANDAGES/DRESSINGS) ×1
DERMABOND ADVANCED .7 DNX12 (GAUZE/BANDAGES/DRESSINGS) ×2 IMPLANT
DEVICE SUTURE ENDOST 10MM (ENDOMECHANICALS) ×1 IMPLANT
DURAPREP 26ML APPLICATOR (WOUND CARE) ×2 IMPLANT
GLOVE BIOGEL PI IND STRL 7.0 (GLOVE) ×4 IMPLANT
GLOVE BIOGEL PI IND STRL 7.5 (GLOVE) ×4 IMPLANT
GLOVE BIOGEL PI INDICATOR 7.0 (GLOVE) ×2
GLOVE BIOGEL PI INDICATOR 7.5 (GLOVE) ×2
GLOVE SURG SS PI 7.0 STRL IVOR (GLOVE) ×12 IMPLANT
GOWN STRL REUS W/ TWL LRG LVL3 (GOWN DISPOSABLE) ×6 IMPLANT
GOWN STRL REUS W/ TWL XL LVL3 (GOWN DISPOSABLE) ×4 IMPLANT
GOWN STRL REUS W/TWL LRG LVL3 (GOWN DISPOSABLE) ×9
GOWN STRL REUS W/TWL XL LVL3 (GOWN DISPOSABLE) ×6
HIBICLENS CHG 4% 4OZ BTL (MISCELLANEOUS) ×6 IMPLANT
KIT TURNOVER KIT B (KITS) ×3 IMPLANT
LIGASURE VESSEL 5MM BLUNT TIP (ELECTROSURGICAL) ×3 IMPLANT
MANIPULATOR VCARE LG CRV RETR (MISCELLANEOUS) IMPLANT
MANIPULATOR VCARE SML CRV RETR (MISCELLANEOUS) IMPLANT
MANIPULATOR VCARE STD CRV RETR (MISCELLANEOUS) ×1 IMPLANT
OCCLUDER COLPOPNEUMO (BALLOONS) ×3 IMPLANT
PACK LAPAROSCOPY BASIN (CUSTOM PROCEDURE TRAY) ×3 IMPLANT
PACK TRENDGUARD 450 HYBRID PRO (MISCELLANEOUS) IMPLANT
PACK TRENDGUARD 600 HYBRD PROC (MISCELLANEOUS) IMPLANT
PAD ARMBOARD 7.5X6 YLW CONV (MISCELLANEOUS) ×6 IMPLANT
PAD OB MATERNITY 4.3X12.25 (PERSONAL CARE ITEMS) ×3 IMPLANT
POUCH LAPAROSCOPIC INSTRUMENT (MISCELLANEOUS) ×6 IMPLANT
SCISSORS LAP 5X35 DISP (ENDOMECHANICALS) ×3 IMPLANT
SET IRRIG TUBING LAPAROSCOPIC (IRRIGATION / IRRIGATOR) ×3 IMPLANT
SET IRRIG Y TYPE TUR BLADDER L (SET/KITS/TRAYS/PACK) IMPLANT
SET TUBE SMOKE EVAC HIGH FLOW (TUBING) ×3 IMPLANT
SOLUTION ELECTROLUBE (MISCELLANEOUS) IMPLANT
SUT ENDO VLOC 180-0-8IN (SUTURE) ×1 IMPLANT
SUT MNCRL AB 4-0 PS2 18 (SUTURE) ×6 IMPLANT
SUT VICRYL 0 UR6 27IN ABS (SUTURE) ×6 IMPLANT
SYR 50ML LL SCALE MARK (SYRINGE) ×3 IMPLANT
SYSTEM CARTER THOMASON II (TROCAR) IMPLANT
TOWEL GREEN STERILE FF (TOWEL DISPOSABLE) ×6 IMPLANT
TRAY FOLEY W/BAG SLVR 14FR LF (SET/KITS/TRAYS/PACK) ×3 IMPLANT
TRENDGUARD 450 HYBRID PRO PACK (MISCELLANEOUS)
TRENDGUARD 600 HYBRID PROC PK (MISCELLANEOUS) ×3
TROCAR 5M 150ML BLDLS (TROCAR) ×2 IMPLANT
TROCAR ADV FIXATION 5X100MM (TROCAR) ×6 IMPLANT
TROCAR BALLN 12MMX100 BLUNT (TROCAR) IMPLANT
TROCAR BALLN GELPORT 12X130M (ENDOMECHANICALS) ×1 IMPLANT
TROCAR XCEL NON-BLD 11X100MML (ENDOMECHANICALS) ×4 IMPLANT
UNDERPAD 30X36 HEAVY ABSORB (UNDERPADS AND DIAPERS) ×3 IMPLANT

## 2018-12-29 NOTE — H&P (Signed)
Obstetrics & Gynecology Surgical H&P   Date of Surgery: 12/29/2018   Primary OBGYN: [facility] Primary Care Provider: Glendon Axe  Reason for Admission: scheduled hysterectomy  History of Present Illness: Marilyn Wilkinson is a 38 y.o. [phone] (Patient's last menstrual period was 12/13/2018.), with the above CC. PMHx is significant for BMI 40s, h/o adhesions, ?HTN, h/o sleep apnea, PCOS, anemia.  Patient has menorrhagia and dysmenorrhea and has failed medical management with Mirena and desired definitive surgical therapy.   She has no concerns today    ROS: A 12-point review of systems was performed and negative, except as stated in the above HPI.  OBGYN History: As per HPI. OB History  Gravida Para Term Preterm AB Living  2 1 0 1 1 2   SAB TAB Ectopic Multiple Live Births  0 0 1 1 2     # Outcome Date GA Lbr Len/2nd Weight Sex Delivery Anes PTL Lv  2 Ectopic 06/2017          1A Preterm 06/26/11 [redacted]w[redacted]d  880 g F CS-LTranv Spinal  LIV  1B Preterm 06/26/11 [redacted]w[redacted]d  630 g F CS-LTranv Spinal  LIV    Obstetric Comments  06/2017: rt tubal ectopic. MTx used    Past Medical History: Past Medical History:  Diagnosis Date  . Anemia    pt. told to use iron but she admits that she is not consistent in taking    . 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 Headache(784.0)    migraines  . Hypertension    pt. reports that BP flucuates, has never had any treatment for    . In vitro fertilization   . Infertility of tubal origin   . Polycystic ovarian syndrome   . Right tubal pregnancy without intrauterine pregnancy 06/19/2017   05/29/17: single dose mtx given  . Sleep apnea 2004   has lost weight & no longer having problems in this  area     Past Surgical History: Past Surgical History:  Procedure Laterality Date  . CESAREAN SECTION  06/26/2011   Procedure: CESAREAN SECTION;  Surgeon: Mora Bellman, MD;  Location: [facility];  Service: Gynecology;   Laterality: N/A;  . DIAGNOSTIC LAPAROSCOPY  07/24/2010   REMOVAL OF BILATERAL TUBES BLOCKAGE  . GASTRIC BYPASS  2004    Family History:  Family History  Problem Relation Age of Onset  . Asthma Mother   . Diabetes Mother   . Arthritis Mother   . Fibromyalgia Mother   . Hypertension Father   . Diabetes Father   . Hypertension Maternal Grandmother   . Arthritis Maternal Grandmother     Social History:  Social History   Socioeconomic History  . Marital status: Married    Spouse name: Not on file  . Number of children: Not on file  . Years of education: Not on file  . Highest education level: Not on file  Occupational History  . Not on file  Social Needs  . Financial resource strain: Not on file  . Food insecurity    Worry: Not on file    Inability: Not on file  . Transportation needs    Medical: Not on file    Non-medical: Not on file  Tobacco Use  . Smoking status: Never Smoker  . Smokeless tobacco: Never Used  Substance and Sexual Activity  . Alcohol use: Yes    Comment: SOCIALLY  . Drug use: No  . Sexual activity: Yes  Birth control/protection: None  Lifestyle  . Physical activity    Days per week: Not on file    Minutes per session: Not on file  . Stress: Not on file  Relationships  . Social Musician on phone: Not on file    Gets together: Not on file    Attends religious service: Not on file    Active member of club or organization: Not on file    Attends meetings of clubs or organizations: Not on file    Relationship status: Not on file  . Intimate partner violence    Fear of current or ex partner: Not on file    Emotionally abused: Not on file    Physically abused: Not on file    Forced sexual activity: Not on file  Other Topics Concern  . Not on file  Social History Narrative  . Not on file    Allergy: Allergies  Allergen Reactions  . Shellfish Allergy Hives    Current Outpatient Medications: Medications Prior to Admission   Medication Sig Dispense Refill Last Dose  . aspirin-acetaminophen-caffeine (EXCEDRIN MIGRAINE) 250-250-65 MG tablet Take 1 tablet by mouth every 6 (six) hours as needed for headache.   12/24/2018 at Unknown time  . cetirizine (ZYRTEC) 10 MG tablet Take 10 mg by mouth daily as needed for allergies.   12/24/2018 at Unknown time  . triamcinolone (NASACORT ALLERGY 24HR) 55 MCG/ACT AERO nasal inhaler Place 2 sprays into the nose daily as needed (allergies).   Past Week at Unknown time  . ibuprofen (ADVIL) 200 MG tablet Take 400-600 mg by mouth every 6 (six) hours as needed.   12/26/2018  . polyethylene glycol (MIRALAX / GLYCOLAX) 17 g packet Take 17 g by mouth daily as needed.   12/20/2018     Hospital Medications: Current Facility-Administered Medications  Medication Dose Route Frequency Provider Last Rate Last Dose  . ceFAZolin (ANCEF) 3 g in dextrose 5 % 50 mL IVPB  3 g Intravenous To SS-Surg Uvalde Bing, MD      . lactated ringers infusion   Intravenous Continuous Calwa Bing, MD      . lactated ringers infusion   Intravenous Continuous Shelton Silvas, MD         Physical Exam:   Current Vital Signs 24h Vital Sign Ranges  T 98.7 F (37.1 C) Temp  Avg: 98.7 F (37.1 C)  Min: 98.7 F (37.1 C)  Max: 98.7 F (37.1 C)  BP 127/76 BP  Min: 127/76  Max: 127/76  HR 90 Pulse  Avg: 90  Min: 90  Max: 90  RR 18 Resp  Avg: 18  Min: 18  Max: 18  SaO2 99 % Room Air SpO2  Avg: 99 %  Min: 99 %  Max: 99 %       24 Hour I/O Current Shift I/O  Time Ins Outs No intake/output data recorded. No intake/output data recorded.   Patient Vitals for the past 24 hrs:  BP Temp Temp src Pulse Resp SpO2 Height Weight  12/29/18 1132 127/76 98.7 F (37.1 C) Oral 90 18 99 % 5\' 6"  (1.676 m) 129.1 kg    Body mass index is 45.93 kg/m. Cardiovascular: S1, S2 normal, no murmur, rub or gallop, regular rate and rhythm Respiratory:  Clear to auscultation bilateral. Normal respiratory effort Abdomen:  positive bowel sounds and no masses, hernias; diffusely non tender to palpation, non distended Neuro/Psych:  Normal mood and affect.  Skin:  Warm  and dry.  Extremities: no clubbing, cyanosis, or edema.   From 6/25 Pelvic exam: is limited by body habitus EGBUS: within normal limits, Vagina: within normal limits and with no blood or discharge in the vault, Cervix: normal appearing cervix without tenderness, discharge or lesions. Uterus:  nonenlarged and non tender and Adnexa:  normal adnexa and no mass, fullness, tenderness Rectovaginal: deferred  Laboratory: UPT: negative Recent Labs  Lab 12/27/18 0900  WBC 7.3  HGB 9.1*  HCT 33.7*  PLT 307   Recent Labs  Lab 12/27/18 0910  ABORH O POS Performed at [facility], [address] [place], [place] [address]   O POS    Imaging:  No new imaging  Assessment: Ms. Marilyn Wilkinson is a 38 y.o. [phone] (Patient's last menstrual period was 12/13/2018.) here for scheduled surgery; pt doing well Plan: D/w her re: plan for TLH/BS/cystoscopy. I also told her of chance of needing to convert to an open procedure given her prior history. Pt amenable with proceeding.  Can proceed when OR is ready.   Cornelia Copaharlie Jacee Enerson, Jr. MD Attending [facility](Faculty Practice)

## 2018-12-29 NOTE — Anesthesia Procedure Notes (Signed)
Procedure Name: Intubation Date/Time: 12/29/2018 1:24 PM Performed by: Cleda Daub, CRNA Pre-anesthesia Checklist: Patient identified, Emergency Drugs available, Suction available and Patient being monitored Patient Re-evaluated:Patient Re-evaluated prior to induction Oxygen Delivery Method: Circle system utilized Preoxygenation: Pre-oxygenation with 100% oxygen Induction Type: IV induction Ventilation: Mask ventilation without difficulty and Mask ventilation throughout procedure Laryngoscope Size: Mac and 4 Grade View: Grade I Tube type: Oral Tube size: 7.0 mm Number of attempts: 1 Airway Equipment and Method: Stylet Placement Confirmation: ETT inserted through vocal cords under direct vision,  positive ETCO2 and breath sounds checked- equal and bilateral Secured at: 22 cm Tube secured with: Tape Dental Injury: Teeth and Oropharynx as per pre-operative assessment

## 2018-12-29 NOTE — Progress Notes (Signed)
Admitted from PACU post hysterectomy. Alert and otriented, not in any distress, VSS. Oriented to unit. Incision clean dry and intact. Peripads clean and dry. Will endorse appropriately

## 2018-12-29 NOTE — Progress Notes (Signed)
Attempted to call spouse, Saralyn Pilar, no answer.  Rowe Pavy, RN

## 2018-12-29 NOTE — Transfer of Care (Signed)
Immediate Anesthesia Transfer of Care Note  Patient: Marilyn Wilkinson  Procedure(s) Performed: TOTAL LAPAROSCOPIC HYSTERECTOMY WITH SALPINGECTOMY (Bilateral Abdomen) CYSTOSCOPY (N/A Bladder)  Patient Location: PACU  Anesthesia Type:General  Level of Consciousness: awake, alert , oriented and patient cooperative  Airway & Oxygen Therapy: Patient Spontanous Breathing and Patient connected to face mask oxygen  Post-op Assessment: Report given to RN and Post -op Vital signs reviewed and stable  Post vital signs: Reviewed and stable  Last Vitals:  Vitals Value Taken Time  BP 126/70 12/29/18 1655  Temp    Pulse    Resp 15 12/29/18 1657  SpO2    Vitals shown include unvalidated device data.  Last Pain:  Vitals:   12/29/18 1132  TempSrc: Oral  PainSc: 4       Patients Stated Pain Goal: 2 (83/66/29 4765)  Complications: No apparent anesthesia complications

## 2018-12-29 NOTE — Op Note (Signed)
Operative Note   12/29/2018  PRE-OP DIAGNOSIS *Dysmenorrhea *Menorrhagia *BMI 40s    POST-OP DIAGNOSIS *Same  SURGEON: Surgeon(s) and Role:    * Aletha Halim, MD - Primary  ASSISTANT:     Chancy Milroy, MD - Assisting   An experienced assistant was required given the standard of surgical care given the complexity of the case.  This assistant was needed for exposure, dissection, suctioning, retraction, instrument exchange, and for overall help during the procedure.  PROCEDURE: Total laparoscopic hysterectomy, right salpingectomy, cystoscopy via fluorescein   ANESTHESIA: General and local  ESTIMATED BLOOD LOSS: 65mL  DRAINS: indwelling foley 275mL   TOTAL IV FLUIDS: 1974mL crystalloid  VTE PROPHYLAXIS: SCDs to the bilateral lower extremities  ANTIBIOTICS: Three grams of Cefazolin were given. within 1 hour of skin incision  SPECIMENS: cervix, uterus, right tube  DISPOSITION: PACU - hemodynamically stable.  CONDITION: stable  COMPLICATIONS: None  FINDINGS: Some omental adhesions at the umbilicus and there were three omental adhesions to the posterior abdominal wall superior to that near the liver and stomach. Some slight adhesions at the level of the bladder flap. Grossly normal uterus and bilateral ovaries. Right tube appeared grossly normal and was somewhat adherent to the right ovary with old suture noted. Normal bladder dome and + bilateral yellow/green jets seen from the bilateral ureteral orifices.   DESCRIPTION OF PROCEDURE: After informed consent was obtained, the patient was taken to the operating room where anesthesia was obtained without difficulty. The patient was positioned in the dorsal lithotomy position in [place] and her arms were carefully tucked at her sides and the usual precautions were taken.  She was prepped and draped in normal sterile fashion.  Time-out was performed and a Foley catheter was placed into the bladder. A standard VCare  uterine manipulator was then placed in the uterus without incident. Gloves were then changed, and after injection of local anesthesia, the open technique was used to place an infraumbilical 95-JO baloon trocar under direct visualization. The laparoscope was introduced and CO2 gas was infused for pneumoperitoneum to a pressure of 15 mm Hg and the area below inspected for injury.  The patient was placed in Trendelenburg and the bowel was displaced up into the upper abdomen, and the right and left lateral 5-mm ports and an 11 mm suprapubic port were placed under direct visualization of the laparoscope, after injection of local anesthesia.    The bilateral cornua were then cauterized with the Ligasure device, and the round ligaments were divided on each side with the Ligasure device and the retroperitoneal space was opened bilaterally. The bilateral utero ovarian ligaments were cauterized and transected.  A bladder flap was created and the bladder was dissected down off the lower uterine segment and cervix using electrocautery.  The broad ligaments were then further opened, and the uterine arteries were skeletonized bilaterally, sealed and divided with the Ligasure device.  A colpotomy was performed circumferentially along the ring with electrocautery and the cervix was incised from the vagina and the specimen was removed through the vagina.  A pneumo balloon was placed in the vagina and the vaginal cuff was then closed in a running continuous fashion using the EndoStitch technique with 2-0 V-Lock suture with careful attention to include the vaginal cuff angles and the vaginal mucosa within the closure.  The bilateral tubes were then cauterized, transected and separated from the ovaries, with the Ligasure device and removed; the cystoscopy was then performed, with the above noted findings.  The Ligasure device was lost so the right fallopian tube's mesosalpingx was cauterized and transected with the Kleppinger device  and monopolar scissors and removed from the abdomen. The intraperitoneal pressure was dropped, and all planes of dissection, vascular pedicles and the vaginal cuff were found to be hemostatic.    The suprapubic and umbilical trocars were closed with the Shellia Carwin II device and 0-vicryl.  The lateral trocars were removed under visualization.  The gas was released and both fascial sutures were tied.  The skin incision at the umbilicus and suprapubic port  Were closed with a subcuticular stitch of 3-0 vicryl.  The remaining skin incisions were closed with Dermabond glue.  The patient tolerated the procedure well.  Sponge, lap and needle counts were correct x2.  The patient was taken to recovery room in excellent condition.  Cornelia Copa MD Attending [facility])

## 2018-12-29 NOTE — Anesthesia Preprocedure Evaluation (Addendum)
Anesthesia Evaluation  Patient identified by MRN, date of birth, ID band Patient awake    Reviewed: Allergy & Precautions, NPO status , Patient's Chart, lab work & pertinent test results  Airway Mallampati: IV  TM Distance: >3 FB Neck ROM: Full    Dental  (+) Teeth Intact, Dental Advisory Given   Pulmonary sleep apnea ,    breath sounds clear to auscultation       Cardiovascular hypertension,  Rhythm:Regular Rate:Normal     Neuro/Psych  Headaches, Depression    GI/Hepatic negative GI ROS, Neg liver ROS,   Endo/Other  negative endocrine ROS  Renal/GU negative Renal ROS     Musculoskeletal negative musculoskeletal ROS (+)   Abdominal (+) + obese,   Peds  Hematology   Anesthesia Other Findings   Reproductive/Obstetrics                            Anesthesia Physical Anesthesia Plan  ASA: III  Anesthesia Plan: General   Post-op Pain Management:    Induction: Intravenous  PONV Risk Score and Plan: 4 or greater and Ondansetron, Dexamethasone, Midazolam and Scopolamine patch - Pre-op  Airway Management Planned: Oral ETT  Additional Equipment: None  Intra-op Plan:   Post-operative Plan: Extubation in OR  Informed Consent: I have reviewed the patients History and Physical, chart, labs and discussed the procedure including the risks, benefits and alternatives for the proposed anesthesia with the patient or authorized representative who has indicated his/her understanding and acceptance.     Dental advisory given  Plan Discussed with: CRNA  Anesthesia Plan Comments:        Anesthesia Quick Evaluation

## 2018-12-30 ENCOUNTER — Encounter (HOSPITAL_COMMUNITY): Payer: Self-pay | Admitting: Obstetrics and Gynecology

## 2018-12-30 DIAGNOSIS — N800 Endometriosis of uterus: Secondary | ICD-10-CM | POA: Diagnosis not present

## 2018-12-30 MED ORDER — OXYCODONE-ACETAMINOPHEN 5-325 MG PO TABS: 1.0000 | ORAL_TABLET | Freq: Four times a day (QID) | ORAL | 0 refills | Status: DC | PRN

## 2018-12-30 MED ORDER — SIMETHICONE 80 MG PO CHEW: 80.0000 mg | CHEWABLE_TABLET | Freq: Four times a day (QID) | ORAL | 0 refills | Status: DC | PRN

## 2018-12-30 MED ORDER — FERROUS SULFATE 325 (65 FE) MG PO TABS: 325.0000 mg | ORAL_TABLET | Freq: Two times a day (BID) | ORAL | 1 refills | Status: DC

## 2018-12-30 MED ORDER — DOCUSATE SODIUM 100 MG PO CAPS: 100.0000 mg | ORAL_CAPSULE | Freq: Two times a day (BID) | ORAL | 2 refills | Status: DC | PRN

## 2018-12-30 NOTE — Discharge Instructions (Signed)
Laparoscopic Surgery Discharge Instructions  Instructions Following Major Laparoscopic Surgery You have just undergone a major laparoscopic surgery.  The following list should answer your most common questions.  Although we will discuss your surgery and post-operative instructions with you prior to your discharge, this list will serve as a reminder if you fail to recall the details of what we discussed.  We will discuss your surgery once again in detail at your post-op visit in two to four weeks. If you haven't already done so, please call to make your appointment as soon as possible.  How you will feel: Although you have just undergone a major surgery, your recovery will be significantly shorter since the surgery was performed through much smaller incisions than the traditional approach.  You should feel slightly better each day.  If you suddenly feel much worse than the prior day, please call the clinic.  It's important during the early part of your recovery that you maintain some activity.  Walking is encouraged.  You will quicken your recovery by continued activity.  Incision:  Your incisions will be closed with dissolvable stitches or surgical adhesive (glue).  There may be Band-aids and/or Steri-strips covering your incisions.  If there is no drainage from the incisions you may remove the Band-aids in one to two days.  You may notice some minor bruising at the incision sites.  This is common and will resolve within several days.  Please inform us if the redness at the edges of your incision appears to be spreading.  If the skin around your incision becomes warm to the touch, or if you notice a pus-like drainage, please call the office.  Vaginal Discharge Following a Laparoscopic Hysterectomy: Minor vaginal bleeding or spotting is normal following a hysterectomy.  Bleeding similar to the amount of your period is excessive, and you should inform us of this immediately.  Vaginal spotting may continue  for several weeks following your surgery.  You may notice a yellowish discharge which occasionally occurs as the vaginal stitches dissolve, and may last for several weeks.  Sexual Activity Following a Hysterectomy: Do not have sexual intercourse or place tampons or douches in the vagina prior to your first office visit.  We will discuss when you may resume these activities at that visit.    Stairs/Driving/Activities: You may climb stairs if necessary.  If you've had general anesthesia, do not drive a car the rest of the day today.  You may begin light housework when you feel up to it, but avoid heavy lifting (more than 15-20lbs) or pushing until cleared for these activities by your physician.  Hygiene:  Do not soak your incisions.  Showers are acceptable but you may not take a bath or swim in a pool.  Cleanse your incisions daily with soap and water.  Medications:  Please resume taking any medications that you were taking prior to the surgery.  If we have prescribed any new medications for you, please take them as directed.  Constipation:  It is fairly common to experience some difficulty in moving your bowels following major surgery.  Being active will help to reduce this likelihood. A diet rich in fiber and plenty of liquids is desirable.  If you do become constipated, a mild laxative such as Miralax, Milk of Magnesia, or Metamucil, or a stool softener such as Colace, is recommended.  General Instructions: If you develop a fever of 100.5 degrees or higher, please call the office number(s) below for physician on call.   

## 2018-12-30 NOTE — Discharge Summary (Signed)
Gynecology Physician Postoperative Discharge Summary  Patient ID: AMAMDA CURBOW MRN: [id number] DOB/AGE: Aug 06, 1980 38 y.o.  Admit Date: 12/29/2018 Discharge Date: 12/30/2018  Preoperative Diagnoses: Dysmenorrhea, Menorrhagia, Anemia, BMI 40s   Procedures: Procedure(s) (LRB): TOTAL LAPAROSCOPIC HYSTERECTOMY WITH RIGHT SALPINGECTOMY (Right) CYSTOSCOPY (N/A)  Hospital Course:  Marilyn Wilkinson is a 38 y.o. [id number]  admitted for scheduled surgery.  She underwent the procedures as mentioned above, her operation was uncomplicated. For further details about surgery, please refer to the operative report. Patient had an uncomplicated postoperative course. By time of discharge on POD#1, her pain was controlled on oral pain medications; she was ambulating, voiding without difficulty, tolerating regular diet and passing flatus. She was deemed stable for discharge to home.   Significant Labs: CBC Latest Ref Rng & Units 12/27/2018 08/05/2018 05/29/2017  WBC 4.0 - 10.5 K/uL 7.3 7.8 8.0  Hemoglobin 12.0 - 15.0 g/dL 9.1(L) 10.3(L) 9.1(L)  Hematocrit 36.0 - 46.0 % 33.7(L) 36.5 32.0(L)  Platelets 150 - 400 K/uL 307 399 323    Discharge Exam: Blood pressure 117/69, pulse (!) 109, temperature 99.4 F (37.4 C), temperature source Oral, resp. rate 18, height 5\' 6"  (1.676 m), weight 129.1 kg, last menstrual period 12/13/2018, SpO2 97 %, unknown if currently breastfeeding. General appearance: alert and no distress  Resp: clear to auscultation bilaterally  Cardio: regular rate and rhythm  GI: soft, non-tender; bowel sounds normal; no masses, no organomegaly.  Incisions: C/D/I, no erythema, no drainage noted Pelvic: scant blood on pad  Extremities: extremities normal, atraumatic, no cyanosis or edema and Homans sign is negative, no sign of DVT  Discharged Condition: Stable   Discharge disposition: 01-Home or Self Care   Allergies as of 12/30/2018      Reactions   Shellfish Allergy Hives       Medication List    TAKE these medications   aspirin-acetaminophen-caffeine 250-250-65 MG tablet Commonly known as: EXCEDRIN MIGRAINE Take 1 tablet by mouth every 6 (six) hours as needed for headache.   cetirizine 10 MG tablet Commonly known as: ZYRTEC Take 10 mg by mouth daily as needed for allergies.   docusate sodium 100 MG capsule Commonly known as: COLACE Take 1 capsule (100 mg total) by mouth 2 (two) times daily as needed for mild constipation or moderate constipation.   ferrous sulfate 325 (65 FE) MG tablet Commonly known as: FerrouSul Take 1 tablet (325 mg total) by mouth 2 (two) times daily.   ibuprofen 200 MG tablet Commonly known as: ADVIL Take 400-600 mg by mouth every 6 (six) hours as needed. Notes to patient: Last given today at 5:40 am   Nasacort Allergy 24HR 55 MCG/ACT Aero nasal inhaler Generic drug: triamcinolone Place 2 sprays into the nose daily as needed (allergies).   oxyCODONE-acetaminophen 5-325 MG tablet Commonly known as: PERCOCET/ROXICET Take 1 tablet by mouth every 6 (six) hours as needed.   polyethylene glycol 17 g packet Commonly known as: MIRALAX / GLYCOLAX Take 17 g by mouth daily as needed.   simethicone 80 MG chewable tablet Commonly known as: MYLICON Chew 1 tablet (80 mg total) by mouth 4 (four) times daily as needed for flatulence.      No future appointments. Follow-up [place] for [employer] [facility] at [facility]. Go in 1 month(s).   Specialty: Obstetrics and Gynecology Contact information: [address] [place] [phone]          Signed:  Verita Schneiders, MD, [place]  Scientist, product/process development, [place]

## 2018-12-30 NOTE — Progress Notes (Signed)
Discharged patient to home, VSS, instructions given and explained, concerns addressed. Belongings returned accordingly.

## 2018-12-30 NOTE — Anesthesia Postprocedure Evaluation (Signed)
Anesthesia Post Note  Patient: Marilyn Wilkinson  Procedure(s) Performed: TOTAL LAPAROSCOPIC HYSTERECTOMY WITH RIGHT SALPINGECTOMY (Right Abdomen) CYSTOSCOPY (N/A Bladder)     Patient location during evaluation: PACU Anesthesia Type: General Level of consciousness: awake and alert Pain management: pain level controlled Vital Signs Assessment: post-procedure vital signs reviewed and stable Respiratory status: spontaneous breathing, nonlabored ventilation, respiratory function stable and patient connected to nasal cannula oxygen Cardiovascular status: blood pressure returned to baseline and stable Postop Assessment: no apparent nausea or vomiting Anesthetic complications: no    Last Vitals:  Vitals:   12/30/18 0109 12/30/18 0543  BP: 123/76 117/69  Pulse: (!) 106 (!) 109  Resp: 19 18  Temp: 37.5 C 37.4 C  SpO2: 96% 97%    Last Pain:  Vitals:   12/30/18 0802  TempSrc:   PainSc: 0-No pain   Pain Goal: Patients Stated Pain Goal: 2 (12/29/18 1132)                 [place]

## 2018-12-31 LAB — SURGICAL PATHOLOGY

## 2019-01-03 ENCOUNTER — Telehealth: Payer: Self-pay

## 2019-01-03 NOTE — Telephone Encounter (Signed)
Please send document your phone today.

## 2019-01-04 NOTE — Telephone Encounter (Signed)
Patient and husband called re: temp of 99.8 when felt a little hot today but is overall feeling well. No current fever, chest pain, sob, cough, dysuria, malodorous urine, nausea, vomiting and what sounds like normal abdominal soreness and vaginal discharge and spotting/bleeding.   ED precautions given and pt told that i'm in the office tomorrow and to call for an in office visit if she feels like she needs one  Durene Romans MD Attending Center for [employer] (Faculty Practice) 01/03/2019 Time: [phone]

## 2019-01-24 ENCOUNTER — Ambulatory Visit (INDEPENDENT_AMBULATORY_CARE_PROVIDER_SITE_OTHER): Payer: Managed Care, Other (non HMO) | Admitting: Family Medicine

## 2019-01-24 ENCOUNTER — Encounter: Payer: Self-pay | Admitting: Family Medicine

## 2019-01-24 ENCOUNTER — Other Ambulatory Visit: Payer: Self-pay

## 2019-01-24 VITALS — BP 122/80 | HR 102 | Temp 97.4°F | Ht 66.0 in | Wt 290.8 lb

## 2019-01-24 DIAGNOSIS — D500 Iron deficiency anemia secondary to blood loss (chronic): Secondary | ICD-10-CM | POA: Diagnosis not present

## 2019-01-24 DIAGNOSIS — Z6841 Body Mass Index (BMI) 40.0 and over, adult: Secondary | ICD-10-CM | POA: Diagnosis not present

## 2019-01-24 DIAGNOSIS — E559 Vitamin D deficiency, unspecified: Secondary | ICD-10-CM

## 2019-01-24 DIAGNOSIS — Z7689 Persons encountering health services in other specified circumstances: Secondary | ICD-10-CM

## 2019-01-24 MED ORDER — VITAMIN D3 1.25 MG (50000 UT) PO TABS: 1.0000 | ORAL_TABLET | ORAL | 3 refills | Status: DC

## 2019-01-24 NOTE — Patient Instructions (Addendum)
Please follow up in 6 months to recheck vitamin D and see how you are doing with weight loss  Www.dietdoctor.com- look at intermittent fasting  A resource that I like is www.dietdoctor.com- intermittent fasting  Look at Dr. Koren Bound- PCOS and intermittent fasting  Here are some guidelines to help you with meal planning -  Avoid all processed and packaged foods (bread, pasta, crackers, chips, etc) and beverages containing calories.  Avoid added sugars and excessive natural sugars.  Attention to how you feel if you consume artificial sweeteners.  Do they make you more hungry or raise your blood sugar?  With every meal and snack, aim to get 20 g of protein (3 ounces of meat, 4 ounces of fish, 3 eggs, protein powder, 1 cup [place] yogurt, 1 cup cottage cheese, etc.)  Increase fiber in the form of non-starchy vegetables.  These help you feel full with very little carbohydrates and are good for gut health.  Eat 1 serving healthy carb per meal- 1/2 cup brown rice, beans, potato, corn- pay attention to whether or not this significantly raises your blood sugar. If it does, reduce the frequency you consume these.   Eat 2-3 servings of lower sugar fruits daily.  This includes berries, apples, oranges, peaches, pears, one half banana.  Have small amounts of good fats such as avocado, nuts, olive oil, nut butters, olives.  Add a little cheese to your salads to make them tasty.

## 2019-01-24 NOTE — Progress Notes (Signed)
Subjective:    Patient ID: Marilyn Wilkinson, female    DOB: Jul 27, 1980, 38 y.o.   MRN: [id number]  HPI This is a 38 yo female who presents today to establish care. She lives with her husband and 61 yo twin daughters. Job is stressful at [employer]. does not participate in any leisure activities.  Last CPE- 08/05/18- well woman Pap- 10/12/2015- negative Tdap- 07/13/2013 Flu- not regular, declines Eye- overdue Dental- last year Exercise- not recently   Obesity- has had lost and gained pounds over the years. Had gastric bypass. Was over 300 pounds at her heaviest. Was able to lose 70 pounds on her own with carb restriction. Is concerned about weight and "going down the same road as my mother."   PCOS/endometriosis- had hysterectomy last month. Doing ok. Will be going back to work in about 3 weeks.   Depression-she reports it has been difficult for her since her mother and grandmother died in the last year.  Although her mother was sick, she died unexpectedly.  Past Medical History:  Diagnosis Date  . Anemia    pt. told to use iron but she admits that she is not consistent in taking    . 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 Headache(784.0)    migraines  . Hypertension    pt. reports that BP flucuates, has never had any treatment for    . In vitro fertilization   . Infertility of tubal origin   . Polycystic ovarian syndrome   . Right tubal pregnancy without intrauterine pregnancy 06/19/2017   05/29/17: single dose mtx given  . Sleep apnea 2004   has lost weight & no longer having problems in this  area    Past Surgical History:  Procedure Laterality Date  . CESAREAN SECTION  06/26/2011   Procedure: CESAREAN SECTION;  Surgeon: Catalina Antigua, MD;  Location: [facility];  Service: Gynecology;  Laterality: N/A;  . CYSTOSCOPY N/A 12/29/2018   Procedure: CYSTOSCOPY;  Surgeon: Abie Bing, MD;  Location: [facility];  Service: Gynecology;  Laterality: N/A;  . DIAGNOSTIC  LAPAROSCOPY  07/24/2010   REMOVAL OF BILATERAL TUBES BLOCKAGE  . GASTRIC BYPASS  2004  . TOTAL LAPAROSCOPIC HYSTERECTOMY WITH SALPINGECTOMY Right 12/29/2018   Procedure: TOTAL LAPAROSCOPIC HYSTERECTOMY WITH RIGHT SALPINGECTOMY;  Surgeon: Hopeland Bing, MD;  Location: [facility] OR;  Service: Gynecology;  Laterality: Right;   Family History  Problem Relation Age of Onset  . Asthma Mother   . Diabetes Mother   . Arthritis Mother   . Fibromyalgia Mother   . Hypertension Father   . Diabetes Father   . Hypertension Maternal Grandmother   . Arthritis Maternal Grandmother    Social History   Tobacco Use  . Smoking status: Never Smoker  . Smokeless tobacco: Never Used  Substance Use Topics  . Alcohol use: Yes    Comment: SOCIALLY  . Drug use: No      Review of Systems Per HPI    Objective:   Physical Exam    BP 122/80 (BP Location: Left Arm, Patient Position: Sitting, Cuff Size: Large)   Pulse (!) 102   Temp (!) 97.4 F (36.3 C) (Temporal)   Ht 5\' 6"  (1.676 m)   Wt 290 lb 12.8 oz (131.9 kg)   SpO2 98%   BMI 46.94 kg/m  Wt Readings from Last 3 Encounters:  01/24/19 290 lb 12.8 oz (131.9 kg)  12/29/18 284 lb 9 oz (129.1 kg)  12/27/18  284 lb 9 oz (129.1 kg)   Depression screen [facility] 2/9 01/24/2019 06/30/2017 06/22/2017  Decreased Interest 1 1 3   Down, Depressed, Hopeless 1 2 3   PHQ - 2 Score 2 3 6   Altered sleeping 1 1 3   Tired, decreased energy 1 2 3   Change in appetite 0 2 2  Feeling bad or failure about yourself  1 1 3   Trouble concentrating 1 1 3   Moving slowly or fidgety/restless 1 1 3   Suicidal thoughts 0 1 1  PHQ-9 Score 7 12 24   Difficult doing work/chores Somewhat difficult - -       Assessment & Plan:  1. Encounter to establish care - reviewed available information in EMR  2. Vitamin D deficiency - patient with vit d level 9.2 from 08/05/18. She was not aware she was low and has not been taking supplementation.  - Cholecalciferol (VITAMIN D3) 1.25 MG  (50000 UT) TABS; Take 1 tablet by mouth every 7 (seven) days.  Dispense: 12 tablet; Refill: 3 - CBC with Differential/Platelet - Ferritin  3. Iron deficiency anemia due to chronic blood loss - will see if improved since hysterectomy - CBC with Differential/Platelet - Ferritin  4. BMI 45.0-49.9, adult [facility]) - patient motivated to lose weight. Provided written and verbal information regarding resources, balanced diet and other factors that impede weight loss such as stress and sleep.   - follow up in 6 months  Clarene Reamer, FNP-BC  [facility], [place]  01/24/2019 3:39 PM

## 2019-01-25 LAB — CBC WITH DIFFERENTIAL/PLATELET
Basophils Absolute: 0 10*3/uL (ref 0.0–0.2)
Basos: 0 %
EOS (ABSOLUTE): 0.4 10*3/uL (ref 0.0–0.4)
Eos: 5 %
Hematocrit: 30.7 % — ABNORMAL LOW (ref 34.0–46.6)
Hemoglobin: 8.6 g/dL — ABNORMAL LOW (ref 11.1–15.9)
Immature Grans (Abs): 0 10*3/uL (ref 0.0–0.1)
Immature Granulocytes: 0 %
Lymphocytes Absolute: 3.4 10*3/uL — ABNORMAL HIGH (ref 0.7–3.1)
Lymphs: 46 %
MCH: 16.9 pg — ABNORMAL LOW (ref 26.6–33.0)
MCHC: 28 g/dL — ABNORMAL LOW (ref 31.5–35.7)
MCV: 60 fL — ABNORMAL LOW (ref 79–97)
Monocytes Absolute: 0.9 10*3/uL (ref 0.1–0.9)
Monocytes: 11 %
Neutrophils Absolute: 2.9 10*3/uL (ref 1.4–7.0)
Neutrophils: 38 %
Platelets: 359 10*3/uL (ref 150–450)
RBC: 5.09 x10E6/uL (ref 3.77–5.28)
RDW: 20.5 % — ABNORMAL HIGH (ref 11.7–15.4)
WBC: 7.6 10*3/uL (ref 3.4–10.8)

## 2019-01-25 LAB — FERRITIN: Ferritin: 6 ng/mL — ABNORMAL LOW (ref 15–150)

## 2019-01-27 ENCOUNTER — Ambulatory Visit (INDEPENDENT_AMBULATORY_CARE_PROVIDER_SITE_OTHER): Payer: Managed Care, Other (non HMO) | Admitting: Obstetrics and Gynecology

## 2019-01-27 ENCOUNTER — Other Ambulatory Visit: Payer: Self-pay

## 2019-01-27 ENCOUNTER — Encounter: Payer: Self-pay | Admitting: Obstetrics and Gynecology

## 2019-01-27 VITALS — BP 137/84 | HR 111

## 2019-01-27 DIAGNOSIS — F439 Reaction to severe stress, unspecified: Secondary | ICD-10-CM

## 2019-01-27 DIAGNOSIS — Z09 Encounter for follow-up examination after completed treatment for conditions other than malignant neoplasm: Secondary | ICD-10-CM

## 2019-01-27 DIAGNOSIS — Z9889 Other specified postprocedural states: Secondary | ICD-10-CM

## 2019-01-27 LAB — SPECIMEN STATUS REPORT

## 2019-01-27 LAB — VITAMIN B12: Vitamin B-12: 664 pg/mL (ref 232–1245)

## 2019-01-28 ENCOUNTER — Other Ambulatory Visit: Payer: Self-pay | Admitting: Family Medicine

## 2019-01-28 ENCOUNTER — Encounter: Payer: Self-pay | Admitting: Family Medicine

## 2019-01-28 DIAGNOSIS — D509 Iron deficiency anemia, unspecified: Secondary | ICD-10-CM

## 2019-01-30 NOTE — Progress Notes (Signed)
[facility] 01/27/2019  CC: routine post op visit  Subjective:     Marilyn Wilkinson is a 38 y.o. s/p 11/18 TLH/RS/cysto for heavy, painful periods  Patient discharged to home on POD#1  Patient w/o issue and meeting all post op goals  Emotionally, it's been a tough year for her, with the ectopic pregnancy, her mother passing, in addition to the pandemic.   Review of Systems Pertinent items are noted in HPI.    Objective:    BP 137/84   Pulse (!) 111   LMP 12/12/2018  General:  alert  Abdomen: soft, bowel sounds active, non-tender  Incision:   healing well, no drainage, no erythema, no hernia, no seroma, no swelling, no dehiscence, incision well approximated    EGBUS: normal Vaginal vault: normal Cuff: well healed, nttp Assessment:    Doing well postoperatively. Operative findings again reviewed. Pathology report discussed.    Plan:  Okay to come off restrictions but recommend full two months of pelvic rest before sexual intercourse  D/w her re: coping strategies and resources in regards to the depression  RTC: PRN  Durene Romans MD Attending Center for [employer] farm manager)

## 2019-02-03 ENCOUNTER — Other Ambulatory Visit: Payer: Self-pay

## 2019-02-03 ENCOUNTER — Inpatient Hospital Stay: Payer: Managed Care, Other (non HMO) | Attending: Oncology | Admitting: Oncology

## 2019-02-03 ENCOUNTER — Encounter: Payer: Self-pay | Admitting: Oncology

## 2019-02-03 VITALS — BP 133/88 | HR 106 | Temp 97.8°F | Resp 18 | Wt 290.3 lb

## 2019-02-03 DIAGNOSIS — Z90710 Acquired absence of both cervix and uterus: Secondary | ICD-10-CM | POA: Diagnosis not present

## 2019-02-03 DIAGNOSIS — R718 Other abnormality of red blood cells: Secondary | ICD-10-CM | POA: Diagnosis not present

## 2019-02-03 DIAGNOSIS — R5383 Other fatigue: Secondary | ICD-10-CM

## 2019-02-03 DIAGNOSIS — D508 Other iron deficiency anemias: Secondary | ICD-10-CM | POA: Insufficient documentation

## 2019-02-03 DIAGNOSIS — D509 Iron deficiency anemia, unspecified: Secondary | ICD-10-CM | POA: Insufficient documentation

## 2019-02-03 NOTE — Progress Notes (Signed)
New patient for evaluation of anemia and does not tolerate oral iron medication due to constipation.

## 2019-02-03 NOTE — Progress Notes (Signed)
Hematology/Oncology Consult note [facility] Telephone:([phone] [phone]   Patient Care Team: Elby Beck, FNP as PCP - General (Nurse Practitioner)  REFERRING PROVIDER: Elby Beck, FNP CHIEF COMPLAINTS/REASON FOR VISIT:  Evaluation of iron deficiency anemia  HISTORY OF PRESENTING ILLNESS:  Marilyn Wilkinson is a  37 y.o.  female with PMH listed below was seen in consultation at the request of Elby Beck, FNP   for evaluation of iron deficiency anemia.   Reviewed patient's recent labs  01/24/2019 labs revealed anemia with hemoglobin of 8.6, MCV 60.  Ferritin 6 Reviewed patient's previous labs ordered by primary care physician's office, anemia is chronic onset , duration is since at least 2012  Associated signs and symptoms: Patient reports fatigue.  Denies SOB with exertion.  Denies weight loss, easy bruising, hematochezia, hemoptysis, hematuria. Context:  History of iron deficiency: She reports to have a longstanding history of iron deficiency anemia.  She has tried oral iron supplementation in the past and cannot tolerate due to GI side effects, i.e. stomach discomfort as well as constipation. Rectal bleeding: Denies Menstrual bleeding/ Vaginal bleeding : She reports that she used to have menorrhagia, status post hysterectomy in November 2020. Hematemesis or hemoptysis : denies Blood in urine : denies  Pica: ice chips Last endoscopy: Never had one Fatigue: Yes.  SOB: deneis    Review of Systems  Constitutional: Positive for fatigue. Negative for appetite change, chills and fever.  HENT:   Negative for hearing loss and voice change.   Eyes: Negative for eye problems.  Respiratory: Negative for chest tightness and cough.   Cardiovascular: Negative for chest pain.  Gastrointestinal: Negative for abdominal distention, abdominal pain and blood in stool.  Endocrine: Negative for hot flashes.  Genitourinary: Negative for  difficulty urinating and frequency.   Musculoskeletal: Negative for arthralgias.  Skin: Negative for itching and rash.  Neurological: Negative for extremity weakness.  Hematological: Negative for adenopathy.  Psychiatric/Behavioral: Negative for confusion.    MEDICAL HISTORY:  Past Medical History:  Diagnosis Date  . Anemia    pt. told to use iron but she admits that she is not consistent in taking    . 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 Headache(784.0)    migraines  . Hypertension    pt. reports that BP flucuates, has never had any treatment for    . In vitro fertilization   . Infertility of tubal origin   . Polycystic ovarian syndrome   . Right tubal pregnancy without intrauterine pregnancy 06/19/2017   05/29/17: single dose mtx given  . Sleep apnea 2004   has lost weight & no longer having problems in this  area     SURGICAL HISTORY: Past Surgical History:  Procedure Laterality Date  . CESAREAN SECTION  06/26/2011   Procedure: CESAREAN SECTION;  Surgeon: Mora Bellman, MD;  Location: [facility];  Service: Gynecology;  Laterality: N/A;  . CYSTOSCOPY N/A 12/29/2018   Procedure: CYSTOSCOPY;  Surgeon: Aletha Halim, MD;  Location: [place];  Service: Gynecology;  Laterality: N/A;  . DIAGNOSTIC LAPAROSCOPY  07/24/2010   REMOVAL OF BILATERAL TUBES BLOCKAGE  . GASTRIC BYPASS  2004  . TOTAL LAPAROSCOPIC HYSTERECTOMY WITH SALPINGECTOMY Right 12/29/2018   Procedure: TOTAL LAPAROSCOPIC HYSTERECTOMY WITH RIGHT SALPINGECTOMY;  Surgeon: Aletha Halim, MD;  Location: [place];  Service: Gynecology;  Laterality: Right;    SOCIAL HISTORY: Social History   Socioeconomic History  . Marital status: Married  Spouse name: Not on file  . Number of children: Not on file  . Years of education: Not on file  . Highest education level: Not on file  Occupational History  . Not on file  Tobacco Use  . Smoking status: Never Smoker  . Smokeless tobacco: Never Used    Substance and Sexual Activity  . Alcohol use: Yes    Comment: SOCIALLY  . Drug use: No  . Sexual activity: Yes    Birth control/protection: None  Other Topics Concern  . Not on file  Social History Narrative  . Not on file   Social Determinants of Health   Financial Resource Strain:   . Difficulty of Paying Living Expenses: Not on file  Food Insecurity:   . Worried About Programme researcher, broadcasting/film/videounning Out of Food in the Last Year: Not on file  . Ran Out of Food in the Last Year: Not on file  Transportation Needs:   . Lack of Transportation (Medical): Not on file  . Lack of Transportation (Non-Medical): Not on file  Physical Activity:   . Days of Exercise per Week: Not on file  . Minutes of Exercise per Session: Not on file  Stress:   . Feeling of Stress : Not on file  Social Connections:   . Frequency of Communication with Friends and Family: Not on file  . Frequency of Social Gatherings with Friends and Family: Not on file  . Attends Religious Services: Not on file  . Active Member of Clubs or Organizations: Not on file  . Attends BankerClub or Organization Meetings: Not on file  . Marital Status: Not on file  Intimate Partner Violence:   . Fear of Current or Ex-Partner: Not on file  . Emotionally Abused: Not on file  . Physically Abused: Not on file  . Sexually Abused: Not on file    FAMILY HISTORY: Family History  Problem Relation Age of Onset  . Asthma Mother   . Diabetes Mother   . Arthritis Mother   . Fibromyalgia Mother   . Hypertension Father   . Diabetes Father   . Hypertension Maternal Grandmother   . Arthritis Maternal Grandmother     ALLERGIES:  is allergic to shellfish allergy.  MEDICATIONS:  Current Outpatient Medications  Medication Sig Dispense Refill  . aspirin-acetaminophen-caffeine (EXCEDRIN MIGRAINE) 250-250-65 MG tablet Take 1 tablet by mouth every 6 (six) hours as needed for headache.    . cetirizine (ZYRTEC) 10 MG tablet Take 10 mg by mouth daily as needed for  allergies.    . Cholecalciferol (VITAMIN D3) 1.25 MG (50000 UT) TABS Take 1 tablet by mouth every 7 (seven) days. 12 tablet 3  . polyethylene glycol (MIRALAX / GLYCOLAX) 17 g packet Take 17 g by mouth daily as needed.    . triamcinolone (NASACORT ALLERGY 24HR) 55 MCG/ACT AERO nasal inhaler Place 2 sprays into the nose daily as needed (allergies).    Marland Kitchen. ibuprofen (ADVIL) 200 MG tablet Take 400-600 mg by mouth every 6 (six) hours as needed.     No current facility-administered medications for this visit.     PHYSICAL EXAMINATION: ECOG PERFORMANCE STATUS: 1 - Symptomatic but completely ambulatory Vitals:   02/03/19 0945  BP: 133/88  Pulse: (!) 106  Resp: 18  Temp: 97.8 F (36.6 C)   Filed Weights   02/03/19 0945  Weight: 290 lb 4.8 oz (131.7 kg)    Physical Exam Constitutional:      General: She is not in acute distress. HENT:  Head: Normocephalic and atraumatic.  Eyes:     General: No scleral icterus.    Pupils: Pupils are equal, round, and reactive to light.  Cardiovascular:     Rate and Rhythm: Normal rate and regular rhythm.     Heart sounds: Normal heart sounds.  Pulmonary:     Effort: Pulmonary effort is normal. No respiratory distress.     Breath sounds: No wheezing.  Abdominal:     General: Bowel sounds are normal. There is no distension.     Palpations: Abdomen is soft. There is no mass.     Tenderness: There is no abdominal tenderness.  Musculoskeletal:        General: No deformity. Normal range of motion.     Cervical back: Normal range of motion and neck supple.  Skin:    General: Skin is warm and dry.     Findings: No erythema or rash.  Neurological:     Mental Status: She is alert and oriented to person, place, and time.     Cranial Nerves: No cranial nerve deficit.     Coordination: Coordination normal.  Psychiatric:        Behavior: Behavior normal.        Thought Content: Thought content normal.       CMP Latest Ref Rng & Units 12/29/2018    Glucose 70 - 99 mg/dL 75  BUN 6 - 20 mg/dL 11  Creatinine 9.24 - 2.68 mg/dL 3.41  Sodium 962 - 229 mmol/L 138  Potassium 3.5 - 5.1 mmol/L 3.8  Chloride 98 - 111 mmol/L 109  CO2 22 - 32 mmol/L 22  Calcium 8.9 - 10.3 mg/dL 7.9(G)  Total Protein 6.0 - 8.5 g/dL -  Total Bilirubin 0.0 - 1.2 mg/dL -  Alkaline Phos 39 - 921 IU/L -  AST 0 - 40 IU/L -  ALT 0 - 32 IU/L -   CBC Latest Ref Rng & Units 01/24/2019  WBC 3.4 - 10.8 x10E3/uL 7.6  Hemoglobin 11.1 - 15.9 g/dL 1.9(E)  Hematocrit 17.4 - 46.6 % 30.7(L)  Platelets 150 - 450 x10E3/uL 359     LABORATORY DATA:  I have reviewed the data as listed Lab Results  Component Value Date   WBC 7.6 01/24/2019   HGB 8.6 (L) 01/24/2019   HCT 30.7 (L) 01/24/2019   MCV 60 (L) 01/24/2019   PLT 359 01/24/2019   Recent Labs    08/05/18 0850 12/29/18 1200  NA 137 138  K 4.2 3.8  CL 100 109  CO2 22 22  GLUCOSE 84 75  BUN 11 11  CREATININE 0.57 0.61  CALCIUM 8.9 8.7*  GFRNONAA 119 >60  GFRAA 137 >60  PROT 7.1  --   ALBUMIN 4.2  --   AST 18  --   ALT 16  --   ALKPHOS 79  --   BILITOT 0.5  --    Iron/TIBC/Ferritin/ %Sat    Component Value Date/Time   FERRITIN 6 (L) 01/24/2019 1514     No results found.    ASSESSMENT & PLAN:  1. Other iron deficiency anemia   2. Microcytosis    Labs are reviewed and discussed with patient. I would also obtain baseline CBC, CMP and repeat her iron level.  Previous labs were consistent with iron deficiency anemia. Patient has tried oral iron supplementation in the past and cannot tolerate due to GI side effects. Recommend IV iron with Venofer 200mg  weekly x 4 doses. Allergy reactions/infusion reaction including anaphylactic  reaction discussed with patient. Other side effects include but not limited to high blood pressure, skin rash, weight gain, leg swelling, etc. Patient voices understanding and willing to proceed.  Chronic microcytosis, this can be secondary to chronic iron deficiency or  underlying hemoglobinopathy.  Will check hemoglobinopathy profile. Patient works at [employer] and requires blood work to be done at [employer].  Prescription was provided to her.  All questions were answered. The patient knows to call the clinic with any problems questions or concerns.  Cc Emi Belfast, FNP  Return of visit: 8 weeks  Thank you for this kind referral and the opportunity to participate in the care of this patient. A copy of today's note is routed to referring provider  Total face to face encounter time for this patient visit was . >50% of the time was  spent in counseling and coordination of care.    Rickard Patience, MD, PhD Hematology Oncology [facility] at [facility] Pager- [phone] 02/03/2019

## 2019-02-07 ENCOUNTER — Encounter: Payer: Self-pay | Admitting: Oncology

## 2019-02-08 ENCOUNTER — Encounter: Payer: Self-pay | Admitting: *Deleted

## 2019-02-09 ENCOUNTER — Encounter: Payer: Self-pay | Admitting: Oncology

## 2019-02-10 ENCOUNTER — Telehealth: Payer: Self-pay | Admitting: *Deleted

## 2019-02-10 NOTE — Telephone Encounter (Signed)
Patient called asking if her labs results have been seen and if she needs to proceed with Iron infusion next week. please return her call

## 2019-02-10 NOTE — Telephone Encounter (Signed)
Yes, please let her know that that repeat blood work confirms low iron and anemia.    Recommend her to proceed the scheduled venofer.  Please schedule her to get addition weekly Venofer x 3.  Follow up in 12 weeks, iron labs 1-2 days prior to virtual MD visit +/- Venofer Thanks.

## 2019-02-10 NOTE — Telephone Encounter (Signed)
Call returned to patient and message sent to scheduling for appts and patient informed of doctor response and she states she is going to need [name] done and will get forms to our office to complete

## 2019-02-16 ENCOUNTER — Encounter: Payer: Self-pay | Admitting: Family Medicine

## 2019-02-17 ENCOUNTER — Inpatient Hospital Stay: Payer: Managed Care, Other (non HMO)

## 2019-02-17 ENCOUNTER — Telehealth: Payer: Self-pay

## 2019-02-17 ENCOUNTER — Other Ambulatory Visit: Payer: Self-pay

## 2019-02-17 NOTE — Telephone Encounter (Signed)
Patient called wanting to know if we could write her a note to be out of work since her iron is low. She thinks her hysterectomy procedure on 12/30/2018 is the reason why she is having some low-iron and will need to have iron infusion which has been reccommended by her oncologist. Dr.Pickens has recommended she call her oncologist to get her an excused from work since she is having fatigue and dizzy since they are following her at this time.

## 2019-02-18 ENCOUNTER — Inpatient Hospital Stay: Payer: Managed Care, Other (non HMO) | Attending: Oncology

## 2019-02-18 ENCOUNTER — Other Ambulatory Visit: Payer: Self-pay

## 2019-02-18 VITALS — BP 128/84 | HR 88 | Temp 96.9°F | Resp 16

## 2019-02-18 DIAGNOSIS — R718 Other abnormality of red blood cells: Secondary | ICD-10-CM

## 2019-02-18 DIAGNOSIS — D508 Other iron deficiency anemias: Secondary | ICD-10-CM

## 2019-02-18 DIAGNOSIS — D509 Iron deficiency anemia, unspecified: Secondary | ICD-10-CM | POA: Insufficient documentation

## 2019-02-18 MED ORDER — SODIUM CHLORIDE 0.9 % IV SOLN
Freq: Once | INTRAVENOUS | Status: AC
  Filled 2019-02-18: qty 250

## 2019-02-18 MED ORDER — IRON SUCROSE 20 MG/ML IV SOLN
200.0000 mg | Freq: Once | INTRAVENOUS | Status: AC
  Administered 2019-02-18: 12:00:00 200 mg via INTRAVENOUS
  Filled 2019-02-18: qty 10

## 2019-02-22 ENCOUNTER — Telehealth: Payer: Self-pay

## 2019-02-22 NOTE — Telephone Encounter (Signed)
Spoke to patient this morning and she reports having itchiness and hives after first venofer treatment on Friday. She states that it is worse at night and has been taking 1-2 benadyl tabs but it is not getting better. Patient wants to know if there is anything else she can do?

## 2019-02-22 NOTE — Telephone Encounter (Signed)
Unable to reach patient by phone. Communicated with patient via Mychart.

## 2019-02-22 NOTE — Telephone Encounter (Signed)
Please advise pt to take benadryl 50 mg every 4-6 hours as needed. Drowsiness is a common side effects from Benadryl. Recommend her to avoid driving or operating machine after taking benadryl.  If still no improvement in 1-2 days,  let me know, will start her on steroid. Thanks.

## 2019-02-22 NOTE — Telephone Encounter (Signed)
[name] forms completed and taken to [facility] for patient to pick up. Patient has been notified and will pick up today.

## 2019-02-24 ENCOUNTER — Other Ambulatory Visit: Payer: Self-pay

## 2019-02-24 ENCOUNTER — Other Ambulatory Visit: Payer: Self-pay | Admitting: Oncology

## 2019-02-24 ENCOUNTER — Inpatient Hospital Stay: Payer: Managed Care, Other (non HMO)

## 2019-02-24 VITALS — BP 123/86 | HR 86 | Temp 98.0°F | Resp 18

## 2019-02-24 DIAGNOSIS — D508 Other iron deficiency anemias: Secondary | ICD-10-CM

## 2019-02-24 DIAGNOSIS — D509 Iron deficiency anemia, unspecified: Secondary | ICD-10-CM | POA: Diagnosis not present

## 2019-02-24 DIAGNOSIS — R718 Other abnormality of red blood cells: Secondary | ICD-10-CM

## 2019-02-24 MED ORDER — IRON SUCROSE 20 MG/ML IV SOLN
200.0000 mg | Freq: Once | INTRAVENOUS | Status: AC
  Administered 2019-02-24: 200 mg via INTRAVENOUS
  Filled 2019-02-24: qty 10

## 2019-02-24 MED ORDER — DIPHENHYDRAMINE HCL 25 MG PO CAPS
50.0000 mg | ORAL_CAPSULE | Freq: Four times a day (QID) | ORAL | Status: DC | PRN
  Administered 2019-02-24: 50 mg via ORAL
  Filled 2019-02-24: qty 2

## 2019-02-24 MED ORDER — SODIUM CHLORIDE 0.9 % IV SOLN
Freq: Once | INTRAVENOUS | Status: AC
  Filled 2019-02-24: qty 250

## 2019-02-24 NOTE — Progress Notes (Signed)
Patient reports that she called earlier this week to let Dr. Cathie Hoops know that she had itching after her Venofer treatment. Patient reports that MD advised her to take Benadryl at home, which she did. Patient reports itching subsided yesterday, 02/23/2019. MD, Dr. Cathie Hoops, notified and aware. MD placing order for Benadryl PO in supportive therapy plan, see MAR. Per MD order: administer Benadryl and then proceed with Venofer treatment today.

## 2019-02-28 ENCOUNTER — Telehealth: Payer: Self-pay

## 2019-02-28 NOTE — Telephone Encounter (Signed)
Patient left voicemail stating that she wants a call back regarding her Updated [name] paperwork. Unable to reach pt via phone, left message for her to call back and let us know what exactly is needed? if there is more paperwork we should fill out? We are unsure of what she is needing.

## 2019-03-01 NOTE — Telephone Encounter (Signed)
Issue has been addressed. Appt scheduled on 1/28 had to be moved out to 2/2 so it would be covered by [facility]. Lab was also added to that day to monitor blood counts, per Dr. Cathie Hoops.

## 2019-03-03 ENCOUNTER — Ambulatory Visit: Payer: Managed Care, Other (non HMO)

## 2019-03-04 ENCOUNTER — Inpatient Hospital Stay: Payer: Managed Care, Other (non HMO)

## 2019-03-04 ENCOUNTER — Other Ambulatory Visit: Payer: Self-pay

## 2019-03-04 VITALS — BP 133/91 | HR 88 | Resp 18

## 2019-03-04 DIAGNOSIS — D509 Iron deficiency anemia, unspecified: Secondary | ICD-10-CM | POA: Diagnosis not present

## 2019-03-04 DIAGNOSIS — R718 Other abnormality of red blood cells: Secondary | ICD-10-CM

## 2019-03-04 DIAGNOSIS — D508 Other iron deficiency anemias: Secondary | ICD-10-CM

## 2019-03-04 MED ORDER — DIPHENHYDRAMINE HCL 25 MG PO CAPS
ORAL_CAPSULE | ORAL | Status: AC
  Filled 2019-03-04: qty 2

## 2019-03-04 MED ORDER — IRON SUCROSE 20 MG/ML IV SOLN
200.0000 mg | Freq: Once | INTRAVENOUS | Status: AC
  Administered 2019-03-04: 200 mg via INTRAVENOUS
  Filled 2019-03-04: qty 10

## 2019-03-04 MED ORDER — DIPHENHYDRAMINE HCL 25 MG PO CAPS
50.0000 mg | ORAL_CAPSULE | Freq: Four times a day (QID) | ORAL | Status: DC | PRN
  Administered 2019-03-04: 50 mg via ORAL

## 2019-03-04 MED ORDER — SODIUM CHLORIDE 0.9 % IV SOLN
Freq: Once | INTRAVENOUS | Status: AC
  Filled 2019-03-04: qty 250

## 2019-03-10 ENCOUNTER — Ambulatory Visit: Payer: Managed Care, Other (non HMO)

## 2019-03-15 ENCOUNTER — Other Ambulatory Visit: Payer: Self-pay

## 2019-03-15 ENCOUNTER — Other Ambulatory Visit: Payer: Managed Care, Other (non HMO)

## 2019-03-15 ENCOUNTER — Ambulatory Visit: Payer: Managed Care, Other (non HMO) | Admitting: Oncology

## 2019-03-15 ENCOUNTER — Inpatient Hospital Stay: Payer: Managed Care, Other (non HMO) | Attending: Oncology

## 2019-03-15 VITALS — BP 126/86 | HR 117 | Temp 97.4°F | Resp 20

## 2019-03-15 DIAGNOSIS — D508 Other iron deficiency anemias: Secondary | ICD-10-CM

## 2019-03-15 DIAGNOSIS — R718 Other abnormality of red blood cells: Secondary | ICD-10-CM

## 2019-03-15 DIAGNOSIS — D509 Iron deficiency anemia, unspecified: Secondary | ICD-10-CM | POA: Diagnosis not present

## 2019-03-15 MED ORDER — DIPHENHYDRAMINE HCL 25 MG PO CAPS
50.0000 mg | ORAL_CAPSULE | Freq: Four times a day (QID) | ORAL | Status: DC | PRN
  Administered 2019-03-15: 14:00:00 50 mg via ORAL
  Filled 2019-03-15: qty 2

## 2019-03-15 MED ORDER — SODIUM CHLORIDE 0.9 % IV SOLN
Freq: Once | INTRAVENOUS | Status: AC
  Filled 2019-03-15: qty 250

## 2019-03-15 MED ORDER — IRON SUCROSE 20 MG/ML IV SOLN
200.0000 mg | Freq: Once | INTRAVENOUS | Status: AC
  Administered 2019-03-15: 200 mg via INTRAVENOUS
  Filled 2019-03-15: qty 10

## 2019-03-24 ENCOUNTER — Other Ambulatory Visit: Payer: Self-pay

## 2019-03-24 ENCOUNTER — Inpatient Hospital Stay: Payer: Managed Care, Other (non HMO)

## 2019-03-24 VITALS — BP 133/91 | HR 89 | Temp 97.6°F | Resp 18

## 2019-03-24 DIAGNOSIS — R718 Other abnormality of red blood cells: Secondary | ICD-10-CM

## 2019-03-24 DIAGNOSIS — D509 Iron deficiency anemia, unspecified: Secondary | ICD-10-CM | POA: Diagnosis not present

## 2019-03-24 DIAGNOSIS — D508 Other iron deficiency anemias: Secondary | ICD-10-CM

## 2019-03-24 MED ORDER — DIPHENHYDRAMINE HCL 25 MG PO CAPS
50.0000 mg | ORAL_CAPSULE | Freq: Four times a day (QID) | ORAL | Status: DC | PRN
  Administered 2019-03-24: 50 mg via ORAL
  Filled 2019-03-24: qty 2

## 2019-03-24 MED ORDER — IRON SUCROSE 20 MG/ML IV SOLN
200.0000 mg | Freq: Once | INTRAVENOUS | Status: AC
  Administered 2019-03-24: 14:00:00 200 mg via INTRAVENOUS
  Filled 2019-03-24: qty 10

## 2019-03-24 MED ORDER — SODIUM CHLORIDE 0.9 % IV SOLN
Freq: Once | INTRAVENOUS | Status: AC
  Filled 2019-03-24: qty 250

## 2019-04-12 ENCOUNTER — Other Ambulatory Visit: Payer: Self-pay

## 2019-04-12 ENCOUNTER — Emergency Department (HOSPITAL_COMMUNITY)
Admission: EM | Admit: 2019-04-12 | Discharge: 2019-04-12 | Disposition: A | Discharge status: Home / Self Care | Payer: Managed Care, Other (non HMO) | Attending: Emergency Medicine | Admitting: Emergency Medicine

## 2019-04-12 ENCOUNTER — Emergency Department (HOSPITAL_COMMUNITY): Payer: Managed Care, Other (non HMO)

## 2019-04-12 ENCOUNTER — Telehealth: Payer: Self-pay

## 2019-04-12 ENCOUNTER — Encounter (HOSPITAL_COMMUNITY): Payer: Self-pay

## 2019-04-12 DIAGNOSIS — R0789 Other chest pain: Secondary | ICD-10-CM | POA: Insufficient documentation

## 2019-04-12 DIAGNOSIS — Z79899 Other long term (current) drug therapy: Secondary | ICD-10-CM | POA: Diagnosis not present

## 2019-04-12 DIAGNOSIS — R079 Chest pain, unspecified: Secondary | ICD-10-CM | POA: Diagnosis present

## 2019-04-12 LAB — BASIC METABOLIC PANEL
Anion gap: 8 (ref 5–15)
BUN: 13 mg/dL (ref 6–20)
CO2: 24 mmol/L (ref 22–32)
Calcium: 9 mg/dL (ref 8.9–10.3)
Chloride: 106 mmol/L (ref 98–111)
Creatinine, Ser: 0.79 mg/dL (ref 0.44–1.00)
GFR calc Af Amer: >60 mL/min (ref >60)
GFR calc non Af Amer: >60 mL/min (ref >60)
Glucose, Bld: 88 mg/dL (ref 70–99)
Potassium: 4 mmol/L (ref 3.5–5.1)
Sodium: 138 mmol/L (ref 135–145)

## 2019-04-12 LAB — TROPONIN I (HIGH SENSITIVITY)
Troponin I (High Sensitivity): <2 ng/L (ref <18)
Troponin I (High Sensitivity): <2 ng/L (ref <18)

## 2019-04-12 LAB — CBC
HCT: 45.5 % (ref 36.0–46.0)
Hemoglobin: 13.1 g/dL (ref 12.0–15.0)
MCH: 21.1 pg — ABNORMAL LOW (ref 26.0–34.0)
MCHC: 28.8 g/dL — ABNORMAL LOW (ref 30.0–36.0)
MCV: 73.4 fL — ABNORMAL LOW (ref 80.0–100.0)
Platelets: 251 10*3/uL (ref 150–400)
RBC: 6.2 MIL/uL — ABNORMAL HIGH (ref 3.87–5.11)
RDW: 27.6 % — ABNORMAL HIGH (ref 11.5–15.5)
WBC: 7.6 10*3/uL (ref 4.0–10.5)
nRBC: 0 % (ref 0.0–0.2)

## 2019-04-12 LAB — I-STAT BETA HCG BLOOD, ED (MC, WL, AP ONLY): I-stat hCG, quantitative: <5 m[IU]/mL (ref <5)

## 2019-04-12 MED ORDER — ASPIRIN 81 MG PO CHEW
324.0000 mg | CHEWABLE_TABLET | Freq: Once | ORAL | Status: AC
  Administered 2019-04-12: 324 mg via ORAL
  Filled 2019-04-12: qty 4

## 2019-04-12 NOTE — Telephone Encounter (Signed)
Noted  

## 2019-04-12 NOTE — Telephone Encounter (Signed)
Starting this morning when pt awoke at 6:30 AM pt has had continuous chest tightness and discomfort that runs across chest and under lt arm; no radiation of discomfort into jaw,neck,lt arm or shoulder. Pt has never had this before. Pt has SOB on and off. Pt had H/A yesterday and has had fatigue but pt said she is anemic and taking iron infusions since 02/2019. Pt does not have a way to ck BP.No dizziness. No other covid symptoms and no travel and no known exposure to covid. No available appts this morning and advised could go to UC or ED. Pt said she is going to [facility] ED now. FYI to Harlin Heys FNP.

## 2019-04-12 NOTE — ED Triage Notes (Signed)
Pt reports left sided chest pain that started this morning. States she did have a very stressful day yesterday but reports this feels different from her usual panic attacks. Pt denies any other symptoms. Pt a.o, resp e.u

## 2019-04-12 NOTE — ED Provider Notes (Signed)
[facility] EMERGENCY DEPARTMENT Provider Note   CSN: [id number] Arrival date & time: 04/12/19  1046     History Chief Complaint  Patient presents with  . Chest Pain    Marilyn Wilkinson is a 39 y.o. female.  HPI This patient presents to the emergency department for evaluation of chest pain.  She reports that she had a "stressful day" yesterday but did not have any chest pain then.  She woke up this morning at her usual time and noted some mild to moderate discomfort of her left parasternal chest that radiated under her left breast.  She did not have any pain radiating into her arm or neck.  She did not have any shortness of breath, nausea, diaphoresis.  Her pain has since improved but she called her primary care physician who advised her to come to the emergency department for evaluation.  She does not have a history of hypertension, diabetes or high cholesterol.  She does not smoke.  She has a family history of "heart problems" but she does not think anyone in her family has had a heart attack at a young age.  She was previously treated for PCOS but is not currently taking medications for this including no hormonal birth control pills.  No recent travel, no leg swelling no cough congestion or fever.    Past Medical History:  Diagnosis Date  . Anemia    pt. told to use iron but she admits that she is not consistent in taking    . 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 Headache(784.0)    migraines  . Hypertension    pt. reports that BP flucuates, has never had any treatment for    . In vitro fertilization   . Infertility of tubal origin   . Polycystic ovarian syndrome   . Right tubal pregnancy without intrauterine pregnancy 06/19/2017   05/29/17: single dose mtx given  . Sleep apnea 2004   has lost weight & no longer having problems in this  area     Patient Active Problem List   Diagnosis Date Noted  . Microcytosis 02/03/2019  . IDA (iron  deficiency anemia) 02/03/2019  . Stress 01/27/2019  . Vitamin D deficiency 01/24/2019  . Abnormal uterine bleeding (AUB) 12/29/2018  . Status post hysterectomy 12/29/2018  . Transient hypertension 10/14/2018  . Dysmenorrhea 10/14/2018  . Menorrhagia with regular cycle 10/14/2018  . Low serum vitamin D 08/27/2018  . Absolute anemia 08/27/2018  . BMI 45.0-49.9, adult ([place]) 05/25/2017  . Weight gain following gastric bypass surgery 01/23/2015  . Mass of left side of neck 07/27/2013  . Migraine 07/13/2013  . HTN (hypertension) 07/13/2013  . 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 PCOS (polycystic ovarian syndrome) 03/03/2011    Past Surgical History:  Procedure Laterality Date  . CESAREAN SECTION  06/26/2011   Procedure: CESAREAN SECTION;  Surgeon: Mora Bellman, MD;  Location: [facility];  Service: Gynecology;  Laterality: N/A;  . CYSTOSCOPY N/A 12/29/2018   Procedure: CYSTOSCOPY;  Surgeon: Aletha Halim, MD;  Location: [facility];  Service: Gynecology;  Laterality: N/A;  . DIAGNOSTIC LAPAROSCOPY  07/24/2010   REMOVAL OF BILATERAL TUBES BLOCKAGE  . GASTRIC BYPASS  2004  . TOTAL LAPAROSCOPIC HYSTERECTOMY WITH SALPINGECTOMY Right 12/29/2018   Procedure: TOTAL LAPAROSCOPIC HYSTERECTOMY WITH RIGHT SALPINGECTOMY;  Surgeon: Aletha Halim, MD;  Location: [facility];  Service: Gynecology;  Laterality: Right;     OB History    Gravida  2   Para  1   Term  0   Preterm  1   AB  1   Living  2     SAB  0   TAB  0   Ectopic  1   Multiple  1   Live Births  2        Obstetric Comments  06/2017: rt tubal ectopic. MTx used        Family History  Problem Relation Age of Onset  . Asthma Mother   . Diabetes Mother   . Arthritis Mother   . Fibromyalgia Mother   . Hypertension Father   . Diabetes Father   . Hypertension Maternal Grandmother   . Arthritis Maternal Grandmother     Social History   Tobacco Use  . Smoking status: Never Smoker  . Smokeless tobacco: Never Used    Substance Use Topics  . Alcohol use: Yes    Comment: SOCIALLY  . Drug use: No    Home Medications Prior to Admission medications   Medication Sig Start Date End Date Taking? Authorizing Provider  aspirin-acetaminophen-caffeine (EXCEDRIN MIGRAINE) [phone] MG tablet Take 1 tablet by mouth every 6 (six) hours as needed for headache.   Yes [provider]  cetirizine (ZYRTEC) 10 MG tablet Take 10 mg by mouth daily as needed for allergies.   Yes [provider]  Cholecalciferol (VITAMIN D3) 1.25 MG (50000 UT) TABS Take 1 tablet by mouth every 7 (seven) days. 01/24/19  Yes Emi Belfast, FNP  ibuprofen (ADVIL) 200 MG tablet Take 400-600 mg by mouth every 6 (six) hours as needed.   Yes [provider]  polyethylene glycol (MIRALAX / GLYCOLAX) 17 g packet Take 17 g by mouth daily as needed.   Yes [provider]  triamcinolone (NASACORT ALLERGY 24HR) 55 MCG/ACT AERO nasal inhaler Place 2 sprays into the nose daily as needed (allergies).   Yes [provider]    Allergies    Shellfish allergy  Review of Systems   Review of Systems  Constitutional: Negative for fever.  HENT: Negative for congestion and sore throat.   Respiratory: Negative for cough and shortness of breath.   Cardiovascular: Positive for chest pain.  Gastrointestinal: Negative for abdominal pain, diarrhea, nausea and vomiting.  Genitourinary: Negative for dysuria.  Musculoskeletal: Negative for myalgias.  Skin: Negative for rash.  Neurological: Negative for headaches.  Psychiatric/Behavioral: Negative for behavioral problems.    Physical Exam Updated Vital Signs BP (!) 159/127 (BP Location: Right Arm)   Pulse (!) 106   Temp 98.2 F (36.8 C) (Oral)   Resp 20   LMP 12/13/2018   SpO2 98%   Physical Exam Constitutional:      Appearance: Normal appearance.  HENT:     Head: Normocephalic and atraumatic.     Nose: Nose normal.     Mouth/Throat:     Mouth: Mucous  membranes are moist.  Eyes:     Extraocular Movements: Extraocular movements intact.     Conjunctiva/sclera: Conjunctivae normal.  Cardiovascular:     Rate and Rhythm: Normal rate.  Pulmonary:     Effort: Pulmonary effort is normal.     Breath sounds: Normal breath sounds.  Chest:     Chest wall: No tenderness.  Abdominal:     General: Abdomen is flat.     Palpations: Abdomen is soft.     Tenderness: There is no abdominal tenderness.  Musculoskeletal:        General:  No swelling. Normal range of motion.     Cervical back: Neck supple.  Skin:    General: Skin is warm and dry.  Neurological:     General: No focal deficit present.     Mental Status: She is alert.  Psychiatric:        Mood and Affect: Mood normal.     ED Results / Procedures / Treatments   Labs (all labs ordered are listed, but only abnormal results are displayed) Labs Reviewed  CBC - Abnormal; Notable for the following components:      Result Value   RBC 6.20 (*)    MCV 73.4 (*)    MCH 21.1 (*)    MCHC 28.8 (*)    RDW 27.6 (*)    All other components within normal limits  BASIC METABOLIC PANEL  I-STAT BETA HCG BLOOD, ED ([facility], [facility], [facility] ONLY)  TROPONIN I (HIGH SENSITIVITY)  TROPONIN I (HIGH SENSITIVITY)    EKG EKG Interpretation  Date/Time:  Tuesday April 12 2019 10:48:54 EST Ventricular Rate:  113 PR Interval:  138 QRS Duration: 106 QT Interval:  346 QTC Calculation: 474 R Axis:   -6 Text Interpretation: Sinus tachycardia Indeterminate axis Incomplete right bundle branch block Borderline ECG No significant change since last tracing Confirmed by [facility]  MD, Leonette Most [phone]) on 04/12/2019 12:04:38 PM   Radiology DG Chest 2 View  Result Date: 04/12/2019 CLINICAL DATA:  Pain and weakness for 1 week worsened today, history hypertension EXAM: CHEST - 2 VIEW COMPARISON:  02/09/2016 FINDINGS: Normal heart size, mediastinal contours, and pulmonary vascularity. Lungs clear. No pulmonary infiltrate, pleural  effusion or pneumothorax. Bones unremarkable. IMPRESSION: No acute abnormalities. Electronically Signed   By: Ulyses Southward M.D.   On: 04/12/2019 11:14    Procedures Procedures (including critical care time)  Medications Ordered in ED Medications  aspirin chewable tablet 324 mg (324 mg Oral Given 04/12/19 1235)    ED Course  I have reviewed the triage vital signs and the nursing notes.  Pertinent labs & imaging results that were available during my care of the patient were reviewed by me and considered in my medical decision making (see chart for details). Final Clinical Impression(s) / ED Diagnoses Final diagnoses:  Atypical chest pain    Rx / DC Orders ED Discharge Orders    None       Pollyann Savoy, MD 04/12/19 1454

## 2019-04-28 ENCOUNTER — Telehealth: Payer: Self-pay | Admitting: *Deleted

## 2019-04-28 NOTE — Telephone Encounter (Signed)
Left voicemail for pt to call back or send [place] Mychart message and tell us which [facility] she uses so we can fax order over.

## 2019-04-28 NOTE — Telephone Encounter (Signed)
Patient called stating that she has a lab appointment Wednesday followed by Virtual appointment later that day. She states she works for [employer] and would like an order to get her labs drawn there. She is requesting a return call regarding this [phone] (can leave a voice mail if she does not answer)

## 2019-05-03 ENCOUNTER — Encounter: Payer: Self-pay | Admitting: Oncology

## 2019-05-04 ENCOUNTER — Other Ambulatory Visit: Payer: Managed Care, Other (non HMO)

## 2019-05-04 ENCOUNTER — Inpatient Hospital Stay: Payer: Managed Care, Other (non HMO)

## 2019-05-04 ENCOUNTER — Inpatient Hospital Stay: Payer: Managed Care, Other (non HMO) | Admitting: Oncology

## 2019-05-04 ENCOUNTER — Encounter: Payer: Self-pay | Admitting: Oncology

## 2019-05-05 ENCOUNTER — Inpatient Hospital Stay: Payer: Managed Care, Other (non HMO)

## 2019-05-06 ENCOUNTER — Ambulatory Visit: Payer: Managed Care, Other (non HMO)

## 2019-05-06 ENCOUNTER — Telehealth: Payer: Managed Care, Other (non HMO) | Admitting: Oncology

## 2019-05-11 ENCOUNTER — Inpatient Hospital Stay: Payer: Managed Care, Other (non HMO) | Attending: Oncology | Admitting: Oncology

## 2019-05-11 ENCOUNTER — Encounter: Payer: Self-pay | Admitting: Oncology

## 2019-05-11 DIAGNOSIS — D508 Other iron deficiency anemias: Secondary | ICD-10-CM | POA: Diagnosis not present

## 2019-05-11 DIAGNOSIS — R718 Other abnormality of red blood cells: Secondary | ICD-10-CM

## 2019-05-11 NOTE — Progress Notes (Signed)
Patient contacted for Mychart visit. No concerns voiced. 

## 2019-05-11 NOTE — Progress Notes (Signed)
HEMATOLOGY-ONCOLOGY TeleHEALTH VISIT PROGRESS NOTE  I connected with Marilyn Wilkinson on 05/11/19 at  2:15 PM EDT by video enabled telemedicine visit and verified that I am speaking with the correct person using two identifiers. I discussed the limitations, risks, security and privacy concerns of performing an evaluation and management service by telemedicine and the availability of in-person appointments. I also discussed with the patient that there may be a patient responsible charge related to this service. The patient expressed understanding and agreed to proceed.   Other persons participating in the visit and their role in the encounter:  None  Patient's location: Home  Provider's location: office Chief Complaint: follow up with anemia.    INTERVAL HISTORY Marilyn Wilkinson is a 39 y.o. female who has above history reviewed by me today presents for follow up visit for management of anemia.  Problems and complaints are listed below:  She feels better. Fatigue has improved. No new complaints.  Review of Systems  Constitutional: Negative for appetite change, chills, fatigue and fever.  HENT:   Negative for hearing loss and voice change.   Eyes: Negative for eye problems.  Respiratory: Negative for chest tightness and cough.   Cardiovascular: Negative for chest pain.  Gastrointestinal: Negative for abdominal distention, abdominal pain and blood in stool.  Endocrine: Negative for hot flashes.  Genitourinary: Negative for difficulty urinating and frequency.   Musculoskeletal: Negative for arthralgias.  Skin: Negative for itching and rash.  Neurological: Negative for extremity weakness.  Hematological: Negative for adenopathy.  Psychiatric/Behavioral: Negative for confusion.    Past Medical History:  Diagnosis Date  . Anemia    pt. told to use iron but she admits that she is not consistent in taking    . 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 Headache(784.0)     migraines  . Hypertension    pt. reports that BP flucuates, has never had any treatment for    . In vitro fertilization   . Infertility of tubal origin   . Polycystic ovarian syndrome   . Right tubal pregnancy without intrauterine pregnancy 06/19/2017   05/29/17: single dose mtx given  . Sleep apnea 2004   has lost weight & no longer having problems in this  area    Past Surgical History:  Procedure Laterality Date  . CESAREAN SECTION  06/26/2011   Procedure: CESAREAN SECTION;  Surgeon: Catalina Antigua, MD;  Location: [facility];  Service: Gynecology;  Laterality: N/A;  . CYSTOSCOPY N/A 12/29/2018   Procedure: CYSTOSCOPY;  Surgeon: Rule Bing, MD;  Location: [facility];  Service: Gynecology;  Laterality: N/A;  . DIAGNOSTIC LAPAROSCOPY  07/24/2010   REMOVAL OF BILATERAL TUBES BLOCKAGE  . GASTRIC BYPASS  2004  . TOTAL LAPAROSCOPIC HYSTERECTOMY WITH SALPINGECTOMY Right 12/29/2018   Procedure: TOTAL LAPAROSCOPIC HYSTERECTOMY WITH RIGHT SALPINGECTOMY;  Surgeon:  Bing, MD;  [facility];  Service: Gynecology;  Laterality: Right;    Family History  Problem Relation Age of Onset  . Asthma Mother   . Diabetes Mother   . Arthritis Mother   . Fibromyalgia Mother   . Hypertension Father   . Diabetes Father   . Hypertension Maternal Grandmother   . Arthritis Maternal Grandmother     Social History   Socioeconomic History  . Marital status: Married    Spouse name: Not on file  . Number of children: Not on file  . Years of education: Not on file  . Highest education level: Not  on file  Occupational History  . Not on file  Tobacco Use  . Smoking status: Never Smoker  . Smokeless tobacco: Never Used  Substance and Sexual Activity  . Alcohol use: Yes    Comment: SOCIALLY  . Drug use: No  . Sexual activity: Yes    Birth control/protection: None  Other Topics Concern  . Not on file  Social History Narrative  . Not on file   Social Determinants of Health   Financial  Resource Strain:   . Difficulty of Paying Living Expenses:   Food Insecurity:   . Worried About Programme researcher, broadcasting/film/video in the Last Year:   . Barista in the Last Year:   Transportation Needs:   . Freight forwarder (Medical):   Marland Kitchen Lack of Transportation (Non-Medical):   Physical Activity:   . Days of Exercise per Week:   . Minutes of Exercise per Session:   Stress:   . Feeling of Stress :   Social Connections:   . Frequency of Communication with Friends and Family:   . Frequency of Social Gatherings with Friends and Family:   . Attends Religious Services:   . Active Member of Clubs or Organizations:   . Attends Banker Meetings:   Marland Kitchen Marital Status:   Intimate Partner Violence:   . Fear of Current or Ex-Partner:   . Emotionally Abused:   Marland Kitchen Physically Abused:   . Sexually Abused:     Current Outpatient Medications on File Prior to Visit  Medication Sig Dispense Refill  . aspirin-acetaminophen-caffeine (EXCEDRIN MIGRAINE) 250-250-65 MG tablet Take 1 tablet by mouth every 6 (six) hours as needed for headache.    . cetirizine (ZYRTEC) 10 MG tablet Take 10 mg by mouth daily as needed for allergies.    . Cholecalciferol (VITAMIN D3) 1.25 MG (50000 UT) TABS Take 1 tablet by mouth every 7 (seven) days. 12 tablet 3  . ibuprofen (ADVIL) 200 MG tablet Take 400-600 mg by mouth every 6 (six) hours as needed.    . polyethylene glycol (MIRALAX / GLYCOLAX) 17 g packet Take 17 g by mouth daily as needed.    . triamcinolone (NASACORT ALLERGY 24HR) 55 MCG/ACT AERO nasal inhaler Place 2 sprays into the nose daily as needed (allergies).     No current facility-administered medications on file prior to visit.    Allergies  Allergen Reactions  . Shellfish Allergy Hives       Observations/Objective: There were no vitals filed for this visit. There is no height or weight on file to calculate BMI.  Physical Exam  CBC    Component Value Date/Time   WBC 7.6 04/12/2019 1058    RBC 6.20 (H) 04/12/2019 1058   HGB 13.1 04/12/2019 1058   HGB 8.6 (L) 01/24/2019 1514   HCT 45.5 04/12/2019 1058   HCT 30.7 (L) 01/24/2019 1514   PLT 251 04/12/2019 1058   PLT 359 01/24/2019 1514   MCV 73.4 (L) 04/12/2019 1058   MCV 60 (L) 01/24/2019 1514   MCH 21.1 (L) 04/12/2019 1058   MCHC 28.8 (L) 04/12/2019 1058   RDW 27.6 (H) 04/12/2019 1058   RDW 20.5 (H) 01/24/2019 1514   LYMPHSABS 3.4 (H) 01/24/2019 1514   MONOABS 1.1 (H) 02/17/2011 0932   EOSABS 0.4 01/24/2019 1514   BASOSABS 0.0 01/24/2019 1514    CMP     Component Value Date/Time   NA 138 04/12/2019 1058   NA 137 08/05/2018 0850  K 4.0 04/12/2019 1058   CL 106 04/12/2019 1058   CO2 24 04/12/2019 1058   GLUCOSE 88 04/12/2019 1058   BUN 13 04/12/2019 1058   BUN 11 08/05/2018 0850   CREATININE 0.79 04/12/2019 1058   CREATININE 0.69 12/22/2013 0942   CALCIUM 9.0 04/12/2019 1058   PROT 7.1 08/05/2018 0850   ALBUMIN 4.2 08/05/2018 0850   AST 18 08/05/2018 0850   ALT 16 08/05/2018 0850   ALKPHOS 79 08/05/2018 0850   BILITOT 0.5 08/05/2018 0850   GFRNONAA >60 04/12/2019 1058   GFRAA >60 04/12/2019 1058     Assessment and Plan: 1. Other iron deficiency anemia   2. Microcytosis     Iron deficiency anemia,  S/p IV Venofer.  Labs are reviewed and discussed with patient. Hemoglobin and Iron panel have both improved.  No need for additional IV venofer.   #Microcytosis, MCV has improved.  Hemoglobinopathy evaluation showed normal pattern. Reflex to alpha thalassemia has not been resulted.    Follow Up Instructions: 6 months.    I discussed the assessment and treatment plan with the patient. The patient was provided an opportunity to ask questions and all were answered. The patient agreed with the plan and demonstrated an understanding of the instructions.  The patient was advised to call back or seek an in-person evaluation if the symptoms worsen or if the condition fails to improve as anticipated.     Earlie Server, MD 05/11/2019 8:22 PM

## 2019-05-17 ENCOUNTER — Encounter: Payer: Self-pay | Admitting: Oncology

## 2019-05-17 ENCOUNTER — Telehealth: Payer: Self-pay | Admitting: *Deleted

## 2019-05-17 NOTE — Telephone Encounter (Signed)
Unable to reach patient. Detailed message left for pt. Advised to call cancer center if she had any questions.

## 2019-05-17 NOTE — Telephone Encounter (Signed)
We have received a copy. Marilyn Wilkinson has a copy a well and will scan into patient's chart.

## 2019-05-17 NOTE — Telephone Encounter (Signed)
Please let her know that her blood work came back. She has alpha thalassemia trait, no intervention or treatment is needed. Follow up as planned.thank you

## 2019-05-17 NOTE — Telephone Encounter (Signed)
[facility] called asking if you received the positive results for Alpha Thalassemia test done 05/02/19. If not please call them for a copy

## 2019-05-24 ENCOUNTER — Encounter: Payer: Self-pay | Admitting: Oncology

## 2019-06-02 ENCOUNTER — Encounter: Payer: Self-pay | Admitting: Radiology

## 2019-07-25 ENCOUNTER — Ambulatory Visit: Payer: Managed Care, Other (non HMO) | Admitting: Family Medicine

## 2019-07-25 ENCOUNTER — Other Ambulatory Visit: Payer: Self-pay

## 2019-07-25 ENCOUNTER — Encounter: Payer: Self-pay | Admitting: Family Medicine

## 2019-07-25 ENCOUNTER — Telehealth: Payer: Self-pay | Admitting: *Deleted

## 2019-07-25 VITALS — BP 124/92 | HR 108 | Temp 97.1°F | Ht 66.0 in | Wt 295.0 lb

## 2019-07-25 DIAGNOSIS — Z6841 Body Mass Index (BMI) 40.0 and over, adult: Secondary | ICD-10-CM | POA: Diagnosis not present

## 2019-07-25 DIAGNOSIS — D508 Other iron deficiency anemias: Secondary | ICD-10-CM | POA: Diagnosis not present

## 2019-07-25 DIAGNOSIS — D563 Thalassemia minor: Secondary | ICD-10-CM

## 2019-07-25 DIAGNOSIS — R7989 Other specified abnormal findings of blood chemistry: Secondary | ICD-10-CM

## 2019-07-25 NOTE — Patient Instructions (Addendum)
Good to see you today  Try to take ferrous sulfate 325 mg 2-3 times a week, may cause constipation, can take stool softerner/ Miralax  Follow up in 6 months

## 2019-07-25 NOTE — Telephone Encounter (Signed)
Patient called requesting to come in for infusion, stating that she is very fatigued and feels like she did before when she needed them. Please advise

## 2019-07-25 NOTE — Progress Notes (Signed)
   Subjective:    Patient ID: Marilyn Wilkinson, female    DOB: July 30, 1980, 39 y.o.   MRN: [id number]  HPI Chief Complaint  Patient presents with  . Follow-up    Pt is feeling sluggish again - is planning on contacting hematologist to restart infusions   This is a 39 yo female who presents today for follow up of chronic medical conditions.   Iron deficiency- has regular follow up with hematology, last infusion earlier this year. Is having symptoms similar to previous anemia- fatigue, problems concentrating. Had hysterectomy last fall. Hematology work up showed thalassemia   Fatigue- feels rested if she gets 8 hours of sleep. No snoring or apnea. Low energy. High stress with work, family responsibilities.   Obesity- no regular exercise, sits all day for work, Good water intake. Has been trying keto- eggs/ [place] bacon, lunch is salad- with grilled chicken/ ground [place], low fat dressing, dinner- chicken, fish, rice/ pasta/ potatoes, vegetables. Drinks water, has cut out juice over last couple of months. Avoids snacking.   Review of Systems Per HPI    Objective:   Physical Exam Vitals reviewed.  Constitutional:      General: She is not in acute distress.    Appearance: Normal appearance. She is obese. She is not ill-appearing, toxic-appearing or diaphoretic.  HENT:     Head: Normocephalic and atraumatic.  Cardiovascular:     Rate and Rhythm: Tachycardia present.  Pulmonary:     Effort: Pulmonary effort is normal.  Neurological:     Mental Status: She is alert and oriented to person, place, and time.  Psychiatric:        Mood and Affect: Mood normal.        Behavior: Behavior normal.        Thought Content: Thought content normal.        Judgment: Judgment normal.       BP (!) 124/92 (BP Location: Left Arm, Patient Position: Sitting, Cuff Size: Large)   Pulse (!) 108   Temp (!) 97.1 F (36.2 C) (Temporal)   Ht 5\' 6"  (1.676 m)   Wt 295 lb (133.8 kg)   LMP 12/13/2018   SpO2  93%   BMI 47.61 kg/m  Wt Readings from Last 3 Encounters:  07/25/19 295 lb (133.8 kg)  02/03/19 290 lb 4.8 oz (131.7 kg)  01/24/19 290 lb 12.8 oz (131.9 kg)   BP Readings from Last 3 Encounters:  07/25/19 (!) 124/92  04/12/19 132/85  03/24/19 (!) 133/91        Assessment & Plan:  1. BMI 45.0-49.9, adult ([facility]) - discussed patient's diet, stress level, impact of sleep.  - TSH - Lipid Panel  2. Low serum vitamin D - has been taking weekly high dose supplementation - Vitamin D, 25-hydroxy  3. Other iron deficiency anemia - discussed taking otc ferrous sulfate 2-3 times per week, she is going to follow up early with hematology for consideration of infusion  4. Alpha thalassemia trait - noted  - follow up in 6 months  This visit occurred during the SARS-CoV-2 public health emergency.  Safety protocols were in place, including screening questions prior to the visit, additional usage of staff PPE, and extensive cleaning of exam room while observing appropriate contact time as indicated for disinfecting solutions.    07-06-1983, FNP-BC  [facility] [address], [facility]  07/25/2019 9:43 AM

## 2019-07-26 LAB — LIPID PANEL
Chol/HDL Ratio: 2.7 ratio (ref 0.0–4.4)
Cholesterol, Total: 137 mg/dL (ref 100–199)
HDL: 50 mg/dL (ref >39)
LDL Chol Calc (NIH): 73 mg/dL (ref 0–99)
Triglycerides: 71 mg/dL (ref 0–149)
VLDL Cholesterol Cal: 14 mg/dL (ref 5–40)

## 2019-07-26 LAB — VITAMIN D 25 HYDROXY (VIT D DEFICIENCY, FRACTURES): Vit D, 25-Hydroxy: 26.6 ng/mL — ABNORMAL LOW (ref 30.0–100.0)

## 2019-07-26 LAB — TSH: TSH: 1.7 u[IU]/mL (ref 0.450–4.500)

## 2019-07-26 NOTE — Telephone Encounter (Addendum)
Please schedule.  She was given a [facility] requisition from last visit.  Ask her to have her labs drawn prior to MD appt

## 2019-07-26 NOTE — Telephone Encounter (Signed)
Done...   Pt 10/2019 MD/+/- Venofer appts has been moved up to this month as requested. Pt is aware of appt date, time and to have her labs drawn prior to her  appt date.

## 2019-07-26 NOTE — Telephone Encounter (Signed)
Please move her appt up to this month. Lab prior, md +/- venofer

## 2019-07-26 NOTE — Telephone Encounter (Signed)
Dr. Cathie Hoops, please advise.  She has MD/Venofer scheduled for 10/2019

## 2019-08-03 ENCOUNTER — Encounter: Payer: Self-pay | Admitting: Oncology

## 2019-08-08 ENCOUNTER — Inpatient Hospital Stay: Payer: Managed Care, Other (non HMO) | Admitting: Oncology

## 2019-08-08 ENCOUNTER — Inpatient Hospital Stay: Payer: Managed Care, Other (non HMO)

## 2019-08-23 ENCOUNTER — Encounter: Payer: Self-pay | Admitting: Family Medicine

## 2019-08-23 ENCOUNTER — Inpatient Hospital Stay: Payer: Managed Care, Other (non HMO) | Attending: Oncology | Admitting: Oncology

## 2019-08-23 ENCOUNTER — Encounter: Payer: Self-pay | Admitting: Oncology

## 2019-08-23 DIAGNOSIS — R5383 Other fatigue: Secondary | ICD-10-CM

## 2019-08-23 DIAGNOSIS — D508 Other iron deficiency anemias: Secondary | ICD-10-CM

## 2019-08-23 DIAGNOSIS — D563 Thalassemia minor: Secondary | ICD-10-CM | POA: Diagnosis not present

## 2019-08-23 MED ORDER — CYANOCOBALAMIN 500 MCG PO TABS: 500.0000 ug | ORAL_TABLET | Freq: Every day | ORAL | 1 refills | Status: DC

## 2019-08-23 NOTE — Progress Notes (Signed)
HEMATOLOGY-ONCOLOGY TeleHEALTH VISIT PROGRESS NOTE  I connected with LANEAH LUFT on 08/23/19 at  2:30 PM EDT by video enabled telemedicine visit and verified that I am speaking with the correct person using two identifiers. I discussed the limitations, risks, security and privacy concerns of performing an evaluation and management service by telemedicine and the availability of in-person appointments. I also discussed with the patient that there may be a patient responsible charge related to this service. The patient expressed understanding and agreed to proceed.   Other persons participating in the visit and their role in the encounter:  None  Patient's location: Home  Provider's location: office Chief Complaint: follow up with anemia.    INTERVAL HISTORY Marilyn Wilkinson is a 39 y.o. female who has above history reviewed by me today presents for follow up visit for management of anemia.  Problems and complaints are listed below:  Patient still feels fatigued.  Otherwise no new complaints.  Review of Systems  Constitutional: Negative for appetite change, chills, fatigue and fever.  HENT:   Negative for hearing loss and voice change.   Eyes: Negative for eye problems.  Respiratory: Negative for chest tightness and cough.   Cardiovascular: Negative for chest pain.  Gastrointestinal: Negative for abdominal distention, abdominal pain and blood in stool.  Endocrine: Negative for hot flashes.  Genitourinary: Negative for difficulty urinating and frequency.   Musculoskeletal: Negative for arthralgias.  Skin: Negative for itching and rash.  Neurological: Negative for extremity weakness.  Hematological: Negative for adenopathy.  Psychiatric/Behavioral: Negative for confusion.    Past Medical History:  Diagnosis Date  . Anemia    pt. told to use iron but she admits that she is not consistent in taking    . 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 Headache(784.0)     migraines  . Hypertension    pt. reports that BP flucuates, has never had any treatment for    . In vitro fertilization   . Infertility of tubal origin   . Polycystic ovarian syndrome   . Right tubal pregnancy without intrauterine pregnancy 06/19/2017   05/29/17: single dose mtx given  . Sleep apnea 2004   has lost weight & no longer having problems in this  area    Past Surgical History:  Procedure Laterality Date  . CESAREAN SECTION  06/26/2011   Procedure: CESAREAN SECTION;  Surgeon: Catalina Antigua, MD;  Location: [facility];  Service: Gynecology;  Laterality: N/A;  . CYSTOSCOPY N/A 12/29/2018   Procedure: CYSTOSCOPY;  Surgeon: [place] Bing, MD;  Location: [facility];  Service: Gynecology;  Laterality: N/A;  . DIAGNOSTIC LAPAROSCOPY  07/24/2010   REMOVAL OF BILATERAL TUBES BLOCKAGE  . GASTRIC BYPASS  2004  . TOTAL LAPAROSCOPIC HYSTERECTOMY WITH SALPINGECTOMY Right 12/29/2018   Procedure: TOTAL LAPAROSCOPIC HYSTERECTOMY WITH RIGHT SALPINGECTOMY;  Surgeon: [place] Bing, MD;  Location: [facility];  Service: Gynecology;  Laterality: Right;    Family History  Problem Relation Age of Onset  . Asthma Mother   . Diabetes Mother   . Arthritis Mother   . Fibromyalgia Mother   . Hypertension Father   . Diabetes Father   . Hypertension Maternal Grandmother   . Arthritis Maternal Grandmother     Social History   Socioeconomic History  . Marital status: Married    Spouse name: Not on file  . Number of children: Not on file  . Years of education: Not on file  . Highest education level: Not  on file  Occupational History  . Not on file  Tobacco Use  . Smoking status: Never Smoker  . Smokeless tobacco: Never Used  Vaping Use  . Vaping Use: Never used  Substance and Sexual Activity  . Alcohol use: Yes    Comment: SOCIALLY  . Drug use: No  . Sexual activity: Yes    Birth control/protection: None  Other Topics Concern  . Not on file  Social History Narrative  . Not on file   Social  Determinants of Health   Financial Resource Strain:   . Difficulty of Paying Living Expenses:   Food Insecurity:   . Worried About Programme researcher, broadcasting/film/video in the Last Year:   . Barista in the Last Year:   Transportation Needs:   . Freight forwarder (Medical):   Marland Kitchen Lack of Transportation (Non-Medical):   Physical Activity:   . Days of Exercise per Week:   . Minutes of Exercise per Session:   Stress:   . Feeling of Stress :   Social Connections:   . Frequency of Communication with Friends and Family:   . Frequency of Social Gatherings with Friends and Family:   . Attends Religious Services:   . Active Member of Clubs or Organizations:   . Attends Banker Meetings:   Marland Kitchen Marital Status:   Intimate Partner Violence:   . Fear of Current or Ex-Partner:   . Emotionally Abused:   Marland Kitchen Physically Abused:   . Sexually Abused:     Current Outpatient Medications on File Prior to Visit  Medication Sig Dispense Refill  . cetirizine (ZYRTEC) 10 MG tablet Take 10 mg by mouth daily as needed for allergies.    . Cholecalciferol (VITAMIN D3) 1.25 MG (50000 UT) TABS Take 1 tablet by mouth every 7 (seven) days. 12 tablet 3  . polyethylene glycol (MIRALAX / GLYCOLAX) 17 g packet Take 17 g by mouth daily as needed.    . triamcinolone (NASACORT ALLERGY 24HR) 55 MCG/ACT AERO nasal inhaler Place 2 sprays into the nose daily as needed (allergies).    Marland Kitchen aspirin-acetaminophen-caffeine (EXCEDRIN MIGRAINE) 250-250-65 MG tablet Take 1 tablet by mouth every 6 (six) hours as needed for headache. (Patient not taking: Reported on 08/23/2019)    . Cholecalciferol (VITAMIN D3) 1.25 MG (50000 UT) CAPS Take 1 tablet by mouth once a week.    Marland Kitchen ibuprofen (ADVIL) 200 MG tablet Take 400-600 mg by mouth every 6 (six) hours as needed.     No current facility-administered medications on file prior to visit.    Allergies  Allergen Reactions  . Shellfish Allergy Hives        Observations/Objective: Today's Vitals   08/23/19 1422  PainSc: 0-No pain   There is no height or weight on file to calculate BMI.  Physical Exam Neurological:     Mental Status: She is alert.     CBC    Component Value Date/Time   WBC 7.6 04/12/2019 1058   RBC 6.20 (H) 04/12/2019 1058   HGB 13.1 04/12/2019 1058   HGB 8.6 (L) 01/24/2019 1514   HCT 45.5 04/12/2019 1058   HCT 30.7 (L) 01/24/2019 1514   PLT 251 04/12/2019 1058   PLT 359 01/24/2019 1514   MCV 73.4 (L) 04/12/2019 1058   MCV 60 (L) 01/24/2019 1514   MCH 21.1 (L) 04/12/2019 1058   MCHC 28.8 (L) 04/12/2019 1058   RDW 27.6 (H) 04/12/2019 1058   RDW 20.5 (H) 01/24/2019  1514   LYMPHSABS 3.4 (H) 01/24/2019 1514   MONOABS 1.1 (H) 02/17/2011 0932   EOSABS 0.4 01/24/2019 1514   BASOSABS 0.0 01/24/2019 1514    CMP     Component Value Date/Time   NA 138 04/12/2019 1058   NA 137 08/05/2018 0850   K 4.0 04/12/2019 1058   CL 106 04/12/2019 1058   CO2 24 04/12/2019 1058   GLUCOSE 88 04/12/2019 1058   BUN 13 04/12/2019 1058   BUN 11 08/05/2018 0850   CREATININE 0.79 04/12/2019 1058   CREATININE 0.69 12/22/2013 0942   CALCIUM 9.0 04/12/2019 1058   PROT 7.1 08/05/2018 0850   ALBUMIN 4.2 08/05/2018 0850   AST 18 08/05/2018 0850   ALT 16 08/05/2018 0850   ALKPHOS 79 08/05/2018 0850   BILITOT 0.5 08/05/2018 0850   GFRNONAA >60 04/12/2019 1058   GFRAA >60 04/12/2019 1058     Assessment and Plan: 1. Other iron deficiency anemia   2. Alpha thalassemia trait   3. Other fatigue     History of iron deficiency anemia, gastric bypass. S/p IV Venofer.  I reviewed patient's blood work done at [facility]. Ferritin is 45, iron saturation 17 hemoglobin is at 13.7 Adequate iron stores at this point.  No need for IV or oral iron supplementation.  #alpha thalassemia trait, chronic microcytosis. #Chronic fatigue, unknown etiology.  A proportional to her anemia level. Check copper and zinc.  Check vitamin B12  level.  Follow Up Instructions: 6 months.    I discussed the assessment and treatment plan with the patient. The patient was provided an opportunity to ask questions and all were answered. The patient agreed with the plan and demonstrated an understanding of the instructions.  The patient was advised to call back or seek an in-person evaluation if the symptoms worsen or if the condition fails to improve as anticipated.    Rickard Patience, MD 08/23/2019 10:59 PM

## 2019-08-23 NOTE — Progress Notes (Signed)
Patient stated she is still overly fatigued.

## 2019-08-24 ENCOUNTER — Ambulatory Visit: Payer: Managed Care, Other (non HMO)

## 2019-09-12 ENCOUNTER — Encounter: Payer: Self-pay | Admitting: Obstetrics and Gynecology

## 2019-09-12 ENCOUNTER — Other Ambulatory Visit: Payer: Self-pay

## 2019-09-12 ENCOUNTER — Ambulatory Visit (INDEPENDENT_AMBULATORY_CARE_PROVIDER_SITE_OTHER): Payer: Managed Care, Other (non HMO) | Admitting: Obstetrics and Gynecology

## 2019-09-12 VITALS — BP 138/92 | HR 105 | Wt 300.0 lb

## 2019-09-12 DIAGNOSIS — Z01419 Encounter for gynecological examination (general) (routine) without abnormal findings: Secondary | ICD-10-CM

## 2019-09-12 DIAGNOSIS — N943 Premenstrual tension syndrome: Secondary | ICD-10-CM | POA: Diagnosis not present

## 2019-09-12 MED ORDER — METFORMIN HCL 1000 MG PO TABS: 500.0000 mg | ORAL_TABLET | Freq: Two times a day (BID) | ORAL | 12 refills | Status: DC

## 2019-09-12 NOTE — Progress Notes (Signed)
Obstetrics and Gynecology Annual Patient Evaluation  Appointment Date: 09/12/2019  OBGYN Clinic: [facility]  Primary Care Provider: Olean Ree B  Chief Complaint: Annual exam, PMS s/s.  History of Present Illness: Marilyn Wilkinson is a 39 y.o. African-American [phone] (Patient's last menstrual period was 12/13/2018.), seen for the above chief complaint.  Patient trying to watch diet and exercise for weight loss but not much success. She was on metformin in the past with IVF and is wondering about this.    Review of Systems: Pertinent items noted in HPI and remainder of comprehensive ROS otherwise negative.   Patient Active Problem List   Diagnosis Date Noted  . Alpha thalassemia trait 07/25/2019  . Microcytosis 02/03/2019  . IDA (iron deficiency anemia) 02/03/2019  . Stress 01/27/2019  . Vitamin D deficiency 01/24/2019  . Abnormal uterine bleeding (AUB) 12/29/2018  . Status post hysterectomy 12/29/2018  . Transient hypertension 10/14/2018  . Dysmenorrhea 10/14/2018  . Menorrhagia with regular cycle 10/14/2018  . Low serum vitamin D 08/27/2018  . Absolute anemia 08/27/2018  . BMI 45.0-49.9, adult (HCC) 05/25/2017  . Weight gain following gastric bypass surgery 01/23/2015  . Mass of left side of neck 07/27/2013  . Migraine 07/13/2013  . HTN (hypertension) 07/13/2013  . 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 PCOS (polycystic ovarian syndrome) 03/03/2011    Past Medical History:  Past Medical History:  Diagnosis Date  . Anemia    pt. told to use iron but she admits that she is not consistent in taking    . 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 Headache(784.0)    migraines  . Hypertension    pt. reports that BP flucuates, has never had any treatment for    . In vitro fertilization   . Infertility of tubal origin   . Polycystic ovarian syndrome   . Right tubal pregnancy without intrauterine pregnancy 06/19/2017    05/29/17: single dose mtx given  . Sleep apnea 2004   has lost weight & no longer having problems in this  area     Past Surgical History:  Past Surgical History:  Procedure Laterality Date  . CESAREAN SECTION  06/26/2011   Procedure: CESAREAN SECTION;  Surgeon: Catalina Antigua, MD;  Location: [facility];  Service: Gynecology;  Laterality: N/A;  . CYSTOSCOPY N/A 12/29/2018   Procedure: CYSTOSCOPY;  Surgeon: [place] Bing, MD;  Location: [facility] OR;  Service: Gynecology;  Laterality: N/A;  . DIAGNOSTIC LAPAROSCOPY  07/24/2010   REMOVAL OF BILATERAL TUBES BLOCKAGE  . GASTRIC BYPASS  2004  . TOTAL LAPAROSCOPIC HYSTERECTOMY WITH SALPINGECTOMY Right 12/29/2018   Procedure: TOTAL LAPAROSCOPIC HYSTERECTOMY WITH RIGHT SALPINGECTOMY;  Surgeon: [place] Bing, MD;  Location: [facility] OR;  Service: Gynecology;  Laterality: Right;    Past Obstetrical History:  OB History  Gravida Para Term Preterm AB Living  2 1 0 1 1 2   SAB TAB Ectopic Multiple Live Births  0 0 1 1 2     # Outcome Date GA Lbr Len/2nd Weight Sex Delivery Anes PTL Lv  2 Ectopic 06/2017          1A Preterm 06/26/11 [redacted]w[redacted]d  1 lb 15 oz (0.88 kg) F CS-LTranv Spinal  LIV  1B Preterm 06/26/11 [redacted]w[redacted]d  1 lb 6.2 oz (0.63 kg) F CS-LTranv Spinal  LIV    Obstetric Comments  06/2017: rt tubal ectopic. MTx used    Past Gynecological History: As per HPI.  Social History:  Social History  Socioeconomic History  . Marital status: Married    Spouse name: Not on file  . Number of children: Not on file  . Years of education: Not on file  . Highest education level: Not on file  Occupational History  . Not on file  Tobacco Use  . Smoking status: Never Smoker  . Smokeless tobacco: Never Used  Vaping Use  . Vaping Use: Never used  Substance and Sexual Activity  . Alcohol use: Yes    Comment: SOCIALLY  . Drug use: No  . Sexual activity: Yes    Birth control/protection: Surgical  Other Topics Concern  . Not on file  Social History Narrative  .  Not on file   Social Determinants of Health   Financial Resource Strain:   . Difficulty of Paying Living Expenses:   Food Insecurity:   . Worried About Programme researcher, broadcasting/film/video in the Last Year:   . Barista in the Last Year:   Transportation Needs:   . Freight forwarder (Medical):   Marland Kitchen Lack of Transportation (Non-Medical):   Physical Activity:   . Days of Exercise per Week:   . Minutes of Exercise per Session:   Stress:   . Feeling of Stress :   Social Connections:   . Frequency of Communication with Friends and Family:   . Frequency of Social Gatherings with Friends and Family:   . Attends Religious Services:   . Active Member of Clubs or Organizations:   . Attends Banker Meetings:   Marland Kitchen Marital Status:   Intimate Partner Violence:   . Fear of Current or Ex-Partner:   . Emotionally Abused:   Marland Kitchen Physically Abused:   . Sexually Abused:     Family History:  Family History  Problem Relation Age of Onset  . Asthma Mother   . Diabetes Mother   . Arthritis Mother   . Fibromyalgia Mother   . Hypertension Father   . Diabetes Father   . Hypertension Maternal Grandmother   . Arthritis Maternal Grandmother     Medications [name] had no medications administered during this visit. Current Outpatient Medications  Medication Sig Dispense Refill  . cetirizine (ZYRTEC) 10 MG tablet Take 10 mg by mouth daily as needed for allergies.    . Cholecalciferol (VITAMIN D3) 1.25 MG (50000 UT) TABS Take 1 tablet by mouth every 7 (seven) days. 12 tablet 3  . polyethylene glycol (MIRALAX / GLYCOLAX) 17 g packet Take 17 g by mouth daily as needed.    . triamcinolone (NASACORT ALLERGY 24HR) 55 MCG/ACT AERO nasal inhaler Place 2 sprays into the nose daily as needed (allergies).    . vitamin B-12 (CYANOCOBALAMIN) 500 MCG tablet Take 1 tablet (500 mcg total) by mouth daily. 90 tablet 1  . aspirin-acetaminophen-caffeine (EXCEDRIN MIGRAINE) 250-250-65 MG tablet Take 1  tablet by mouth every 6 (six) hours as needed for headache. (Patient not taking: Reported on 08/23/2019)    . Cholecalciferol (VITAMIN D3) 1.25 MG (50000 UT) CAPS Take 1 tablet by mouth once a week.    Marland Kitchen ibuprofen (ADVIL) 200 MG tablet Take 400-600 mg by mouth every 6 (six) hours as needed.    . metFORMIN (GLUCOPHAGE) 1000 MG tablet Take 0.5 tablets (500 mg total) by mouth 2 (two) times daily with a meal. After two weeks you can increase to 1000mg  twice a day 30 tablet 12   No current facility-administered medications for this visit.    Allergies Shellfish  allergy   Physical Exam:  BP (!) 138/92   Pulse (!) 105   Wt 300 lb (136.1 kg)   LMP 12/13/2018   BMI 48.42 kg/m  Body mass index is 48.42 kg/m. General appearance: Well nourished, well developed female in no acute distress.  Neck:  Supple, normal appearance, and no thyromegaly  Cardiovascular: normal s1 and s2.  No murmurs, rubs or gallops. Respiratory:  Clear to auscultation bilateral. Normal respiratory effort Abdomen: positive bowel sounds and no masses, hernias; diffusely non tender to palpation, non distended Breasts: breasts appear normal, no suspicious masses, no skin or nipple changes or axillary nodes, and normal inspection. Neuro/Psych:  Normal mood and affect.  Skin:  Warm and dry.  Lymphatic:  No inguinal lymphadenopathy.   Pelvic exam: is not limited by body habitus EGBUS: within normal limits Vagina: within normal limits and with no blood or discharge in the vault Cervix: surgically absent. Normal vaginal cuff Uterus:  nonenlarged and non tender Adnexa:  normal adnexa and no mass, fullness, tenderness Rectovaginal: deferred  Laboratory: none  Radiology: none  Assessment: pt stable  Plan:  *GYN: routine care. No need for paps s/p hyst and normal paps and negative surg path. I d/w her that can't do anything for PMS s/s aside from hormones which has their own caveats especially since her PMS concerns sound  mostly mood related *HTN: d/w pt that I recommend home BP cuff and to give diet and exercise and see how the metformin does and see how her BPs do by the time she has her PCP annual visit early next year and then see about medications *Weight loss: I told her about ?evidence about metformin but can try and see. Recent cmp normal. D/w her can increase to 1000 bid in a few weeks if she desires.  There are no diagnoses linked to this encounter.   RTC 1 year  [place] Bing, Montez Hageman MD Attending [facility])

## 2019-09-12 NOTE — Progress Notes (Signed)
Pt thinks she's having hormonal issues since feb/march

## 2019-11-08 ENCOUNTER — Telehealth: Payer: Managed Care, Other (non HMO) | Admitting: Internal Medicine

## 2019-11-08 ENCOUNTER — Other Ambulatory Visit: Payer: Self-pay

## 2019-11-08 NOTE — Progress Notes (Signed)
Pt cancelled appt

## 2019-11-09 ENCOUNTER — Ambulatory Visit: Payer: Managed Care, Other (non HMO) | Admitting: Oncology

## 2019-11-09 ENCOUNTER — Ambulatory Visit: Payer: Managed Care, Other (non HMO)

## 2020-02-20 ENCOUNTER — Telehealth: Payer: Self-pay | Admitting: Oncology

## 2020-02-20 NOTE — Telephone Encounter (Signed)
Done 02/21/20 appt has been cx per pt request. patient stated she would call later to reschedule.

## 2020-02-20 NOTE — Telephone Encounter (Signed)
Patient is unable to keep her appointment for 1/10 patient stated she would call later to reschedule.

## 2020-02-20 NOTE — Telephone Encounter (Signed)
Please cancel 1/11 virtual visit as requested by patient.

## 2020-02-21 ENCOUNTER — Inpatient Hospital Stay: Payer: Managed Care, Other (non HMO) | Admitting: Oncology

## 2020-04-11 ENCOUNTER — Encounter: Payer: Self-pay | Admitting: Family Medicine

## 2020-04-11 ENCOUNTER — Other Ambulatory Visit: Payer: Self-pay

## 2020-04-11 ENCOUNTER — Ambulatory Visit: Payer: Managed Care, Other (non HMO) | Admitting: Family Medicine

## 2020-04-11 VITALS — BP 160/98 | HR 133 | Temp 98.0°F | Ht 66.0 in | Wt 323.2 lb

## 2020-04-11 DIAGNOSIS — F322 Major depressive disorder, single episode, severe without psychotic features: Secondary | ICD-10-CM

## 2020-04-11 DIAGNOSIS — F411 Generalized anxiety disorder: Secondary | ICD-10-CM | POA: Diagnosis not present

## 2020-04-11 MED ORDER — CLONAZEPAM 0.5 MG PO TABS: 0.2500 mg | ORAL_TABLET | Freq: Two times a day (BID) | ORAL | 1 refills | Status: DC

## 2020-04-11 MED ORDER — FLUOXETINE HCL 20 MG PO CAPS: 20.0000 mg | ORAL_CAPSULE | Freq: Every day | ORAL | 1 refills | Status: DC

## 2020-04-11 NOTE — Progress Notes (Signed)
Marilyn Sievers [name], MD, CAQ Sports Medicine  Primary Care and Sports Medicine [facility] [address] [place], [address]  Phone: [phone]  FAX: [phone]  Marilyn Wilkinson - 40 y.o. female  MRN [id number]  Date of Birth: 04/05/1980  Date: 04/11/2020  PCP: Emi Belfast, FNP (Inactive)  Referral: No ref. provider found  Chief Complaint  Patient presents with  . Depression  . Anxiety  . Hypertension    This visit occurred during the SARS-CoV-2 public health emergency.  Safety protocols were in place, including screening questions prior to the visit, additional usage of staff PPE, and extensive cleaning of exam room while observing appropriate contact time as indicated for disinfecting solutions.   Subjective:   Marilyn Wilkinson is a 40 y.o. very pleasant female patient with Body mass index is 52.17 kg/m. who presents with the following:  A/P anxiety and depression:  Total encounter time: 40 minutes. This includes total time spent on the day of encounter.  Long conversation with the patient and her husband regarding management and counseling..   She is crying during the whole interview Has been dealing with a lot of stress and a lot going on Work is stressful a lot  Having headaches, bp is up A couple of weeks. Worse for   Not sleeping well, wakig up off and on. Never has had dep in the past Guilty Not herself and and not doing for her family  Labile Some thoughts of being better off with being not here. No plan  Anxious all the time  Job has been there six years, and her position and not now taking phone calls.  Now functions of doing at workse.   Eight yo twins.   Fatigued all of the time.  No energy to work out.   No weight loss meds.  Rare etoh No other substances.   Out of work prozac Klonopin counselling   Review of Systems is noted in the HPI, as appropriate  Objective:   BP (!) 160/98    Pulse (!) 133   Temp 98 F (36.7 C) (Temporal)   Ht 5\' 6"  (1.676 m)   Wt (!) 323 lb 4 oz (146.6 kg)   LMP 12/13/2018   SpO2 97%   BMI 52.17 kg/m   GEN: No acute distress; alert,appropriate. PULM: Breathing comfortably in no respiratory distress PSYCH: labile, crying.      ICD-10-CM   1. Depression, major, single episode, severe (HCC)  F32.2 Ambulatory referral to Psychology  2. GAD (generalized anxiety disorder)  F41.1 Ambulatory referral to Psychology   Total encounter time: 40 minutes. This includes total time spent on the day of encounter.  Very difficult case with acute fairly severe depression.  Patient is crying the entire time of our office visit, she is accompanied by her supportive husband who also provides some history.  She denies suicidality or homicidality and contracts for safety along with her husband.  I am going to have the patient start some Klonopin today, hopefully this will give her some fast stability and help with anxiety.  Start the patient on Prozac 20 mg, initially doing every other day to functionally give her initial dose of 10 mg for 7 days then increasing to 20 mg.  I am going to asked psychology to see her very quickly.  I do not think at this point she can work appropriately, and [name] is entirely appropriate and we will be assessing  her longitudinally for overall psychiatric care and ability to return to work  Meds ordered this encounter  Medications  . clonazePAM (KLONOPIN) 0.5 MG tablet    Sig: Take 0.5-1 tablets (0.25-0.5 mg total) by mouth 2 (two) times daily.    Dispense:  60 tablet    Refill:  1  . FLUoxetine (PROZAC) 20 MG capsule    Sig: Take 1 capsule (20 mg total) by mouth daily.    Dispense:  30 capsule    Refill:  1   Medications Discontinued During This Encounter  Medication Reason  . Cholecalciferol (VITAMIN D3) 1.25 MG (50000 UT) CAPS Completed Course  . ibuprofen (ADVIL) 200 MG tablet Completed Course  . polyethylene glycol  (MIRALAX / GLYCOLAX) 17 g packet Completed Course  . triamcinolone (NASACORT ALLERGY 24HR) 55 MCG/ACT AERO nasal inhaler Completed Course  . vitamin B-12 (CYANOCOBALAMIN) 500 MCG tablet Completed Course   Orders Placed This Encounter  Procedures  . Ambulatory referral to Psychology    Follow-up: Return in about 2 weeks (around 04/25/2020).  Signed,  Elpidio Galea. Antinette Keough, MD   Outpatient Encounter Medications as of 04/11/2020  Medication Sig  . aspirin-acetaminophen-caffeine (EXCEDRIN MIGRAINE) 250-250-65 MG tablet Take 1 tablet by mouth every 6 (six) hours as needed for headache.  . cetirizine (ZYRTEC) 10 MG tablet Take 10 mg by mouth daily as needed for allergies.  . Cholecalciferol (VITAMIN D3) 1.25 MG (50000 UT) TABS Take 1 tablet by mouth every 7 (seven) days.  . clonazePAM (KLONOPIN) 0.5 MG tablet Take 0.5-1 tablets (0.25-0.5 mg total) by mouth 2 (two) times daily.  Marland Kitchen FLUoxetine (PROZAC) 20 MG capsule Take 1 capsule (20 mg total) by mouth daily.  . metFORMIN (GLUCOPHAGE) 1000 MG tablet Take 0.5 tablets (500 mg total) by mouth 2 (two) times daily with a meal. After two weeks you can increase to 1000mg  twice a day (Patient not taking: Reported on 04/11/2020)  . [DISCONTINUED] Cholecalciferol (VITAMIN D3) 1.25 MG (50000 UT) CAPS Take 1 tablet by mouth once a week.  . [DISCONTINUED] ibuprofen (ADVIL) 200 MG tablet Take 400-600 mg by mouth every 6 (six) hours as needed.  . [DISCONTINUED] polyethylene glycol (MIRALAX / GLYCOLAX) 17 g packet Take 17 g by mouth daily as needed.  . [DISCONTINUED] triamcinolone (NASACORT ALLERGY 24HR) 55 MCG/ACT AERO nasal inhaler Place 2 sprays into the nose daily as needed (allergies).  . [DISCONTINUED] vitamin B-12 (CYANOCOBALAMIN) 500 MCG tablet Take 1 tablet (500 mcg total) by mouth daily.   No facility-administered encounter medications on file as of 04/11/2020.

## 2020-04-12 ENCOUNTER — Encounter: Payer: Self-pay | Admitting: Family Medicine

## 2020-04-13 ENCOUNTER — Telehealth: Payer: Self-pay

## 2020-04-13 ENCOUNTER — Telehealth: Payer: Self-pay | Admitting: Family Medicine

## 2020-04-13 NOTE — Telephone Encounter (Signed)
Mr. Conant notified as instructed by telephone.  States understanding.

## 2020-04-13 NOTE — Telephone Encounter (Signed)
Patient had employer fax over [facility] paperwork. Semi filled and placed in Dr.Copland's inbox, due to discussing at last visit.

## 2020-04-13 NOTE — Telephone Encounter (Signed)
Please call.  I don't adding any presriptions on top the 2 new psychoactive medications is a good idea.  Here are my suggestions:  Get plenty of sleep.  Take Tylenol/Acetaminophen ES (500mg ) 2 tabs by mouth three times a day max as needed.   Motrin, Advil, or Generic Ibuprofen 200 mg tablets. 3-4 tablets by mouth 3 times a day. This equals a prescription strength dose.)   Caffeine in the AM can also help.

## 2020-04-13 NOTE — Telephone Encounter (Signed)
Pt's husband lmovm requesting advice on what pt should do or if something can be sent into the pharmacy for pt to help with her headaches, she did report this at [facility]... please advise

## 2020-04-17 NOTE — Telephone Encounter (Signed)
Called patient and informed paperwork faxed and ready for pick up. Husband will come and pick up.   Copy for patient Copy for scan

## 2020-04-18 ENCOUNTER — Ambulatory Visit: Payer: Managed Care, Other (non HMO) | Admitting: Internal Medicine

## 2020-04-24 NOTE — Telephone Encounter (Signed)
More [name] in regards to this matter received. Placed in Dr.Copland's inbox.

## 2020-04-25 NOTE — Telephone Encounter (Signed)
I will look at them again next week.  Very clearly appropriate for [name].

## 2020-05-01 NOTE — Telephone Encounter (Signed)
Called patient and informed her paperwork is done and has been faxed. Pt expressed understanding and will come in tomorrow to pick up copy. Form placed in yellow folders.

## 2020-05-02 ENCOUNTER — Encounter: Payer: Self-pay | Admitting: Family Medicine

## 2020-05-02 ENCOUNTER — Other Ambulatory Visit: Payer: Self-pay

## 2020-05-02 ENCOUNTER — Ambulatory Visit: Payer: Managed Care, Other (non HMO) | Admitting: Family Medicine

## 2020-05-02 VITALS — BP 120/96 | HR 105 | Temp 98.4°F | Ht 66.0 in | Wt 321.5 lb

## 2020-05-02 DIAGNOSIS — F322 Major depressive disorder, single episode, severe without psychotic features: Secondary | ICD-10-CM | POA: Diagnosis not present

## 2020-05-02 DIAGNOSIS — E559 Vitamin D deficiency, unspecified: Secondary | ICD-10-CM

## 2020-05-02 MED ORDER — VITAMIN D3 1.25 MG (50000 UT) PO TABS: 1.0000 | ORAL_TABLET | ORAL | 3 refills | Status: DC

## 2020-05-02 NOTE — Progress Notes (Signed)
Marilyn Gaal T. Kehinde Bowdish, MD, CAQ Sports Medicine  Primary Care and Sports Medicine [facility] [address] [place], [address]  Phone: [phone]  FAX: [phone]  Marilyn Wilkinson - 40 y.o. female  MRN [id number]  Date of Birth: 08-Dec-1980  Date: 05/02/2020  PCP: Emi Belfast, FNP (Inactive)  Referral: No ref. provider found  Chief Complaint  Patient presents with  . Follow-up    Depression/GAD    This visit occurred during the SARS-CoV-2 public health emergency.  Safety protocols were in place, including screening questions prior to the visit, additional usage of staff PPE, and extensive cleaning of exam room while observing appropriate contact time as indicated for disinfecting solutions.   Subjective:   Marilyn Wilkinson is a 40 y.o. very pleasant female patient with Body mass index is 51.89 kg/m. who presents with the following:  F/u MDD and GAD:  She is now seeing Dr. Tomasa Hose from Psychiatry.  Had to stop taking her prozac.   Prozac did make her feel itching.   Dr. Tomasa Hose - put on trazadone.  Has been put back on prozac - she has an appointment with him set up on Friday. She is still taking Klonopin.  Still not sleeping - not bad for years.  No interest Staying at home Not interacting as much with kids.  Still having some fleeting SI without plan and understands that she will never self-harm No HI  04/11/2020 Last OV with Hannah Beat, MD   She is crying during the whole interview Has been dealing with a lot of stress and a lot going on Work is stressful a lot   Having headaches, bp is up A couple of weeks. Worse for    Not sleeping well, wakig up off and on. Never has had dep in the past Guilty Not herself and and not doing for her family   Labile Some thoughts of being better off with being not here. No plan   Anxious all the time   Job has been there six years, and her position and not now taking phone  calls.  Now functions of doing at workse.    Eight yo twins.    Fatigued all of the time.  No energy to work out.    No weight loss meds.  Rare etoh No other substances.    Out of work prozac Klonopin counselling     Review of Systems is noted in the HPI, as appropriate  Objective:   BP (!) 120/96   Pulse (!) 105   Temp 98.4 F (36.9 C) (Temporal)   Ht 5\' 6"  (1.676 m)   Wt (!) 321 lb 8 oz (145.8 kg)   LMP 12/13/2018   SpO2 97%   BMI 51.89 kg/m   GEN: No acute distress; alert,appropriate. PULM: Breathing comfortably in no respiratory distress PSYCH: Normally interactive.  Psych:  Crying intermittently, somewhat labile  Laboratory and Imaging Data:  Assessment and Plan:     ICD-10-CM   1. Depression, major, single episode, severe (HCC)  F32.2   2. Vitamin D deficiency  E55.9 Cholecalciferol (VITAMIN D3) 1.25 MG (50000 UT) TABS   She has not really doing all that well, but she is mildly better.  Unfortunately, she was not able to tolerate Prozac.  My suspicion is that she will need some medication changes over time to find something that she can tolerate.  Thankfully she is now seeing psychiatry, and I am going  to defer her management to their expertise.  At this point I will see her on a as needed basis only.  She will establish primary care with a general health care provider who is taking new patients.  Meds ordered this encounter  Medications  . Cholecalciferol (VITAMIN D3) 1.25 MG (50000 UT) TABS    Sig: Take 1 tablet by mouth every 7 (seven) days.    Dispense:  12 tablet    Refill:  3   Medications Discontinued During This Encounter  Medication Reason  . metFORMIN (GLUCOPHAGE) 1000 MG tablet Completed Course  . Cholecalciferol (VITAMIN D3) 1.25 MG (50000 UT) TABS Reorder  . FLUoxetine (PROZAC) 20 MG capsule    No orders of the defined types were placed in this encounter.   Follow-up: No follow-ups on file.  Signed,  Elpidio Galea. Kaysia Willard,  MD   Outpatient Encounter Medications as of 05/02/2020  Medication Sig  . amoxicillin-clavulanate (AUGMENTIN) 875-125 MG tablet Take by mouth.  . fluticasone (FLONASE) 50 MCG/ACT nasal spray Place into the nose.  Marland Kitchen aspirin-acetaminophen-caffeine (EXCEDRIN MIGRAINE) 250-250-65 MG tablet Take 1 tablet by mouth every 6 (six) hours as needed for headache.  . cetirizine (ZYRTEC) 10 MG tablet Take 10 mg by mouth daily as needed for allergies.  . Cholecalciferol (VITAMIN D3) 1.25 MG (50000 UT) TABS Take 1 tablet by mouth every 7 (seven) days.  . clonazePAM (KLONOPIN) 0.5 MG tablet Take 0.5-1 tablets (0.25-0.5 mg total) by mouth 2 (two) times daily.  . predniSONE (DELTASONE) 20 MG tablet Take 20 mg by mouth 2 (two) times daily.  . traZODone (DESYREL) 50 MG tablet TAKE 1 TABLET BY MOUTH EVERYDAY AT BEDTIME  . vitamin B-12 (CYANOCOBALAMIN) 500 MCG tablet Take 500 mcg by mouth daily.  . [DISCONTINUED] Cholecalciferol (VITAMIN D3) 1.25 MG (50000 UT) TABS Take 1 tablet by mouth every 7 (seven) days.  . [DISCONTINUED] FLUoxetine (PROZAC) 20 MG capsule Take 1 capsule (20 mg total) by mouth daily.  . [DISCONTINUED] metFORMIN (GLUCOPHAGE) 1000 MG tablet Take 0.5 tablets (500 mg total) by mouth 2 (two) times daily with a meal. After two weeks you can increase to 1000mg  twice a day (Patient not taking: Reported on 04/11/2020)   No facility-administered encounter medications on file as of 05/02/2020.

## 2020-05-08 NOTE — Telephone Encounter (Signed)
Additional info requested for [name].  Paperwork semi completed  Placed in Dr [employer] inbox

## 2020-05-09 NOTE — Telephone Encounter (Signed)
Can you let her know that I have extended her [name] until 07/12/2020.   I want her to come to our office for a recheck about 06/29/2020.

## 2020-05-09 NOTE — Telephone Encounter (Signed)
Called pt to notify paperwork completed and faxed  Mailing copy to pt  Copy for scan  Copy retained by me

## 2020-05-29 ENCOUNTER — Telehealth: Payer: Self-pay

## 2020-05-29 NOTE — Telephone Encounter (Signed)
[employer] wants more information for disability claim  Request for last 2 office visit notes sent to [employer]  Forms semi completed and placed in PCP's inbox for review, completion, sign and date

## 2020-05-29 NOTE — Telephone Encounter (Signed)
Patient is calling in stating that her employer is sending over [name] paperwork for [place] to fill out, but states we can disregard the paperwork as it would only cover one day. Marilyn Wilkinson is needing a work note that states she was out from 3/2 and is to return to work on 5/222.

## 2020-05-30 NOTE — Telephone Encounter (Signed)
Letter written as instructed by Dr. Patsy Lager and sent to patient's MyChart.  Azaria notified of this via telephone.

## 2020-05-30 NOTE — Telephone Encounter (Signed)
Can you help do this?  I put his on her [name], but they need more input on those forms.

## 2020-05-30 NOTE — Telephone Encounter (Signed)
I will work on them on Monday.  Unable to do so currently.

## 2020-06-04 NOTE — Telephone Encounter (Signed)
Called pt to inform [name] additional information was completed and faxed- however pt did not answer and no VM set up  Copy mailed to pt  Copy for scan  Copy retained by me

## 2020-06-04 NOTE — Telephone Encounter (Signed)
Can we make sure that she comes into the office for a recheck around 06/29/2020/  Thanks!

## 2020-06-05 NOTE — Telephone Encounter (Signed)
Spoke with patient scheduled follow up appointment 

## 2020-06-12 ENCOUNTER — Telehealth: Payer: Managed Care, Other (non HMO) | Admitting: Family Medicine

## 2020-06-12 NOTE — Telephone Encounter (Signed)
Pt returned phone call - stated that [employer] was wanting to confirm return to work dates  I called [employer] and confirmed  Called pt to inform

## 2020-06-12 NOTE — Telephone Encounter (Signed)
LVM for pt to call me about [name] paperwork 

## 2020-06-12 NOTE — Telephone Encounter (Signed)
[name] paperwork received via fax . Put Marilyn Wilkinson's box

## 2020-06-14 NOTE — Telephone Encounter (Signed)
This has been done.

## 2020-06-14 NOTE — Telephone Encounter (Signed)
LVM for pt to inform paperwork finished and being faxed  Copy mailed to pt  Copy for scan   Copy retained by me

## 2020-06-14 NOTE — Telephone Encounter (Signed)
[employer] called today in regards to this paperwork- for mental health leave it must be completed every 30 days and any new office visit notes sent as well  Last paperwork was sign 05/09/2020  Last progress notes sent 06/04/2020  Updates paperwork placed in PCP's inbox for review sign and date

## 2020-07-02 ENCOUNTER — Ambulatory Visit: Payer: Managed Care, Other (non HMO) | Admitting: Family Medicine

## 2020-07-02 NOTE — Progress Notes (Deleted)
    Marilyn Wilkinson T. Marilyn Mathieu, MD, CAQ Sports Medicine  Primary Care and Sports Medicine [facility] at [facility] [address] [place], [address]  Phone: [phone]  FAX: [phone]  Marilyn Wilkinson - 40 y.o. female  MRN [id number]  Date of Birth: 16-Aug-1980  Date: 07/04/2020  PCP: Emi Belfast, FNP (Inactive)  Referral: No ref. provider found  No chief complaint on file.   This visit occurred during the SARS-CoV-2 public health emergency.  Safety protocols were in place, including screening questions prior to the visit, additional usage of staff PPE, and extensive cleaning of exam room while observing appropriate contact time as indicated for disinfecting solutions.   Subjective:   Marilyn Wilkinson is a 40 y.o. very pleasant female patient with There is no height or weight on file to calculate BMI. who presents with the following:  The patient is here for follow-up on major depression and anxiety.  I prior notes in detail this in great detail.  She does have a psychiatrist, but Dr. Tomasa Hose also wanted our office to be involved and to manage the patient's disability and [name] due to her depression.    Review of Systems is noted in the HPI, as appropriate  Objective:   LMP 12/13/2018   GEN: No acute distress; alert,appropriate. PULM: Breathing comfortably in no respiratory distress PSYCH: Normally interactive.   Laboratory and Imaging Data:  Assessment and Plan:   ***

## 2020-07-04 ENCOUNTER — Ambulatory Visit: Payer: Managed Care, Other (non HMO) | Admitting: Family Medicine

## 2020-07-04 DIAGNOSIS — F411 Generalized anxiety disorder: Secondary | ICD-10-CM

## 2020-07-04 DIAGNOSIS — F322 Major depressive disorder, single episode, severe without psychotic features: Secondary | ICD-10-CM

## 2020-08-05 IMAGING — DX DG CHEST 2V
2 series · 2 of 2 positions shown · non-contrast
Comparison: 02/09/2016

CLINICAL DATA: Pain and weakness for 1 week worsened today, history
hypertension

EXAM:
CHEST - 2 VIEW

[w chest pa]
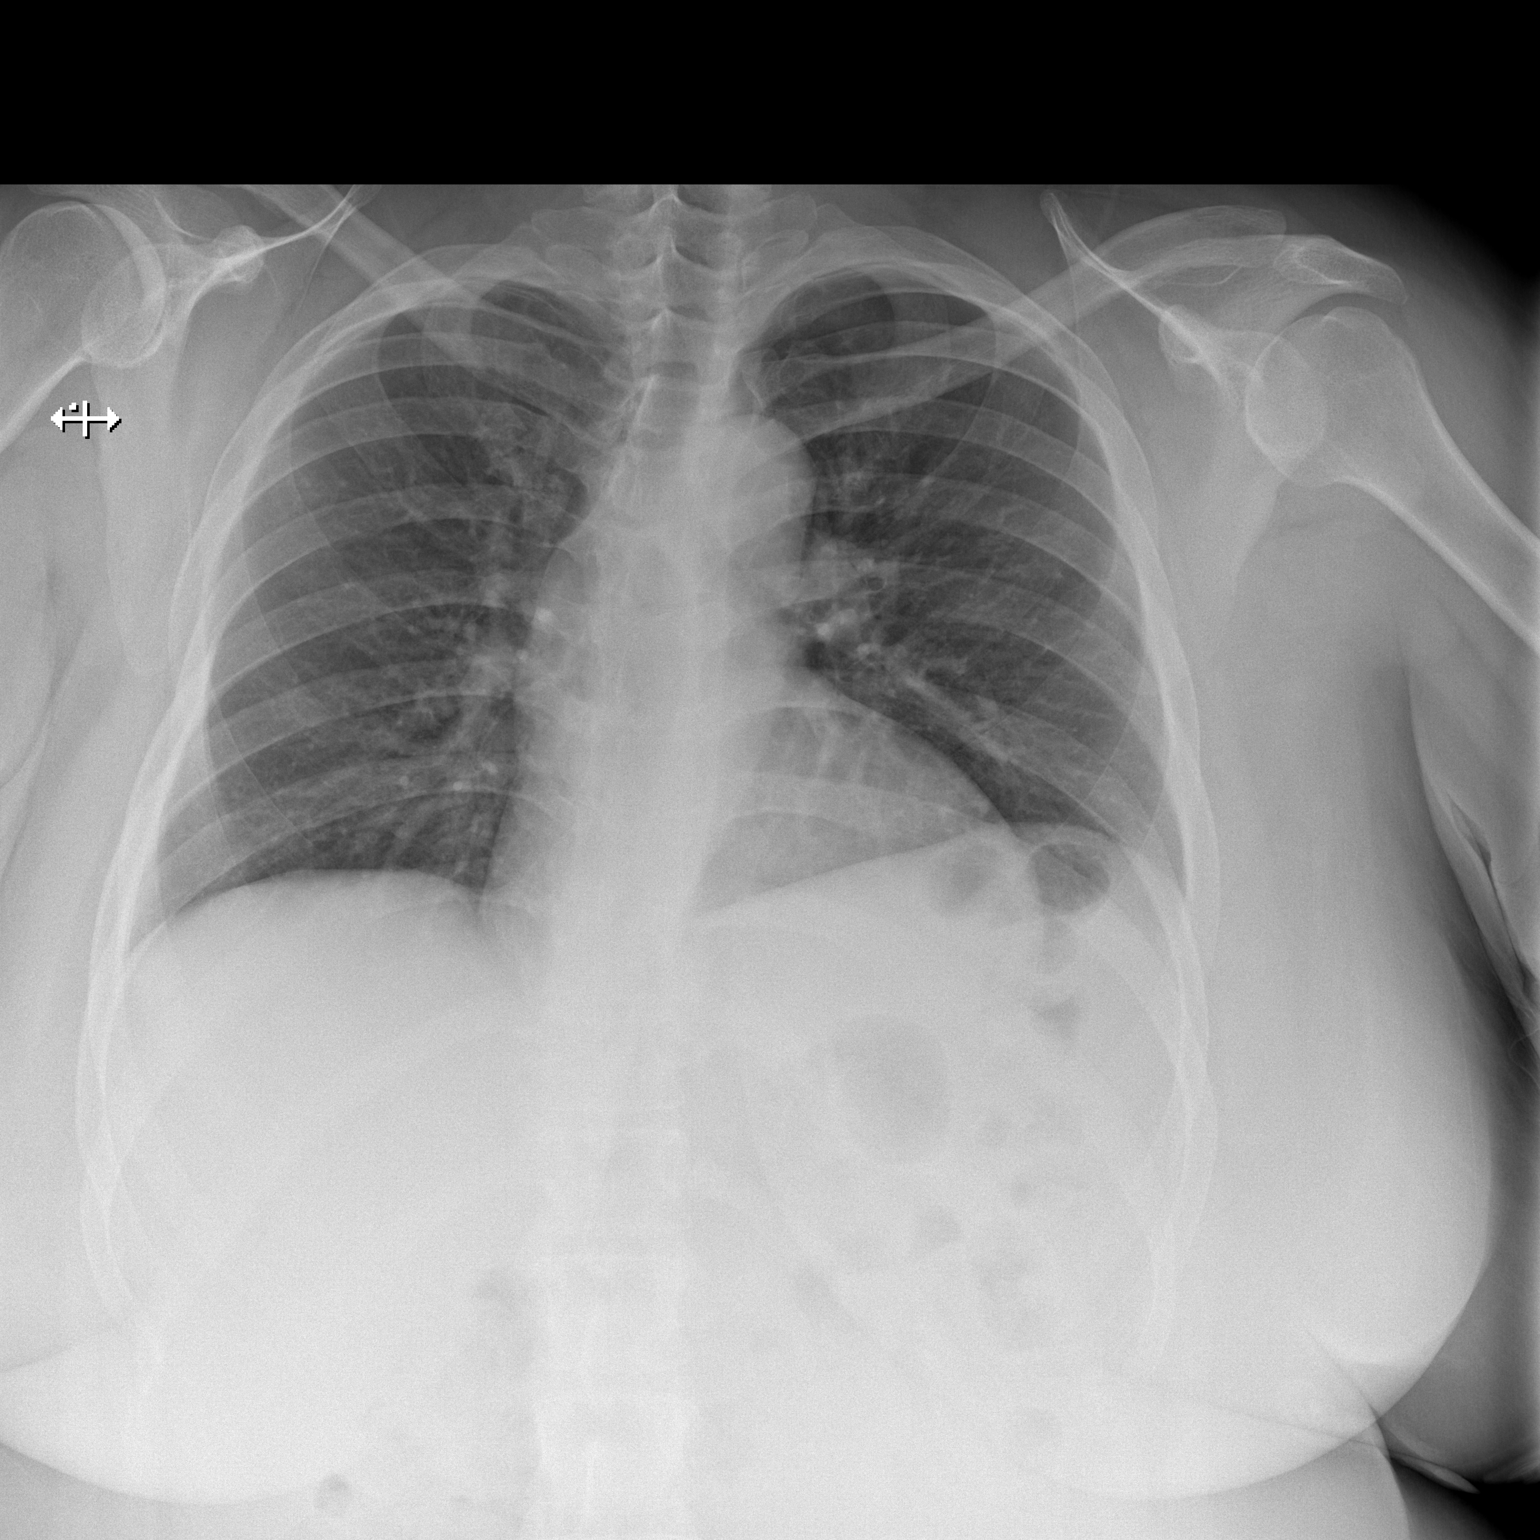

[w chest lat]
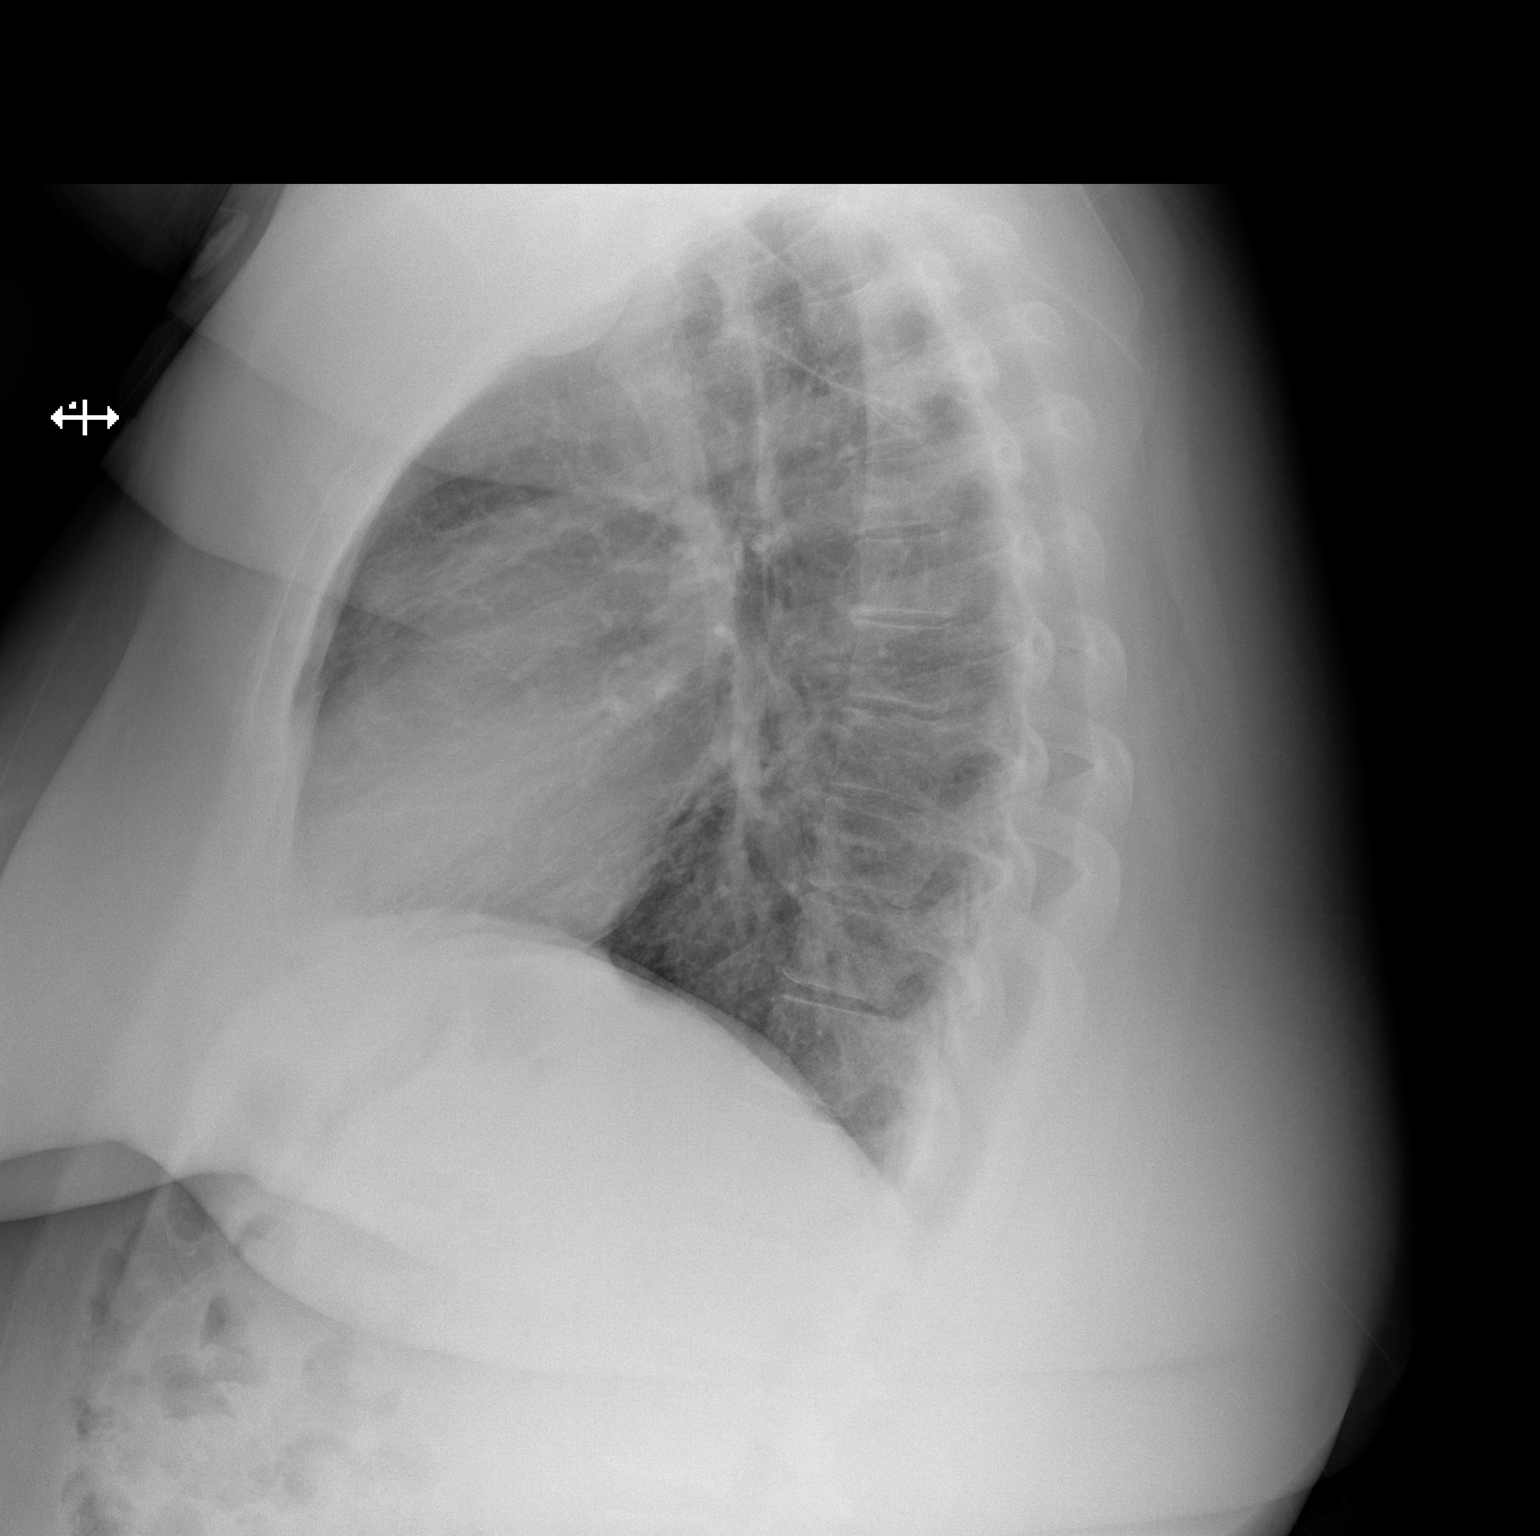

[2 of 2 positions shown; findings below may reference images not displayed]

FINDINGS: Normal heart size, mediastinal contours, and pulmonary vascularity.

Lungs clear.

No pulmonary infiltrate, pleural effusion or pneumothorax.

Bones unremarkable.
IMPRESSION: No acute abnormalities.

## 2020-08-24 ENCOUNTER — Encounter: Payer: Self-pay | Admitting: Radiology

## 2020-11-15 ENCOUNTER — Encounter: Payer: Self-pay | Admitting: Oncology

## 2020-11-22 ENCOUNTER — Ambulatory Visit (INDEPENDENT_AMBULATORY_CARE_PROVIDER_SITE_OTHER): Payer: 59 | Admitting: Family Medicine

## 2020-11-22 ENCOUNTER — Other Ambulatory Visit: Payer: Self-pay

## 2020-11-22 ENCOUNTER — Encounter: Payer: Self-pay | Admitting: Oncology

## 2020-11-22 VITALS — BP 120/92 | HR 91 | Temp 96.2°F | Ht 66.25 in | Wt 292.0 lb

## 2020-11-22 DIAGNOSIS — R7989 Other specified abnormal findings of blood chemistry: Secondary | ICD-10-CM | POA: Diagnosis not present

## 2020-11-22 DIAGNOSIS — I10 Essential (primary) hypertension: Secondary | ICD-10-CM

## 2020-11-22 DIAGNOSIS — Z0000 Encounter for general adult medical examination without abnormal findings: Secondary | ICD-10-CM

## 2020-11-22 LAB — LIPID PANEL
Cholesterol: 123 mg/dL (ref 0–200)
HDL: 49.3 mg/dL (ref >39.00)
LDL Cholesterol: 64 mg/dL (ref 0–99)
NonHDL: 73.41
Total CHOL/HDL Ratio: 2
Triglycerides: 49 mg/dL (ref 0.0–149.0)
VLDL: 9.8 mg/dL (ref 0.0–40.0)

## 2020-11-22 LAB — COMPREHENSIVE METABOLIC PANEL
ALT: 12 U/L (ref 0–35)
AST: 16 U/L (ref 0–37)
Albumin: 4.1 g/dL (ref 3.5–5.2)
Alkaline Phosphatase: 55 U/L (ref 39–117)
BUN: 14 mg/dL (ref 6–23)
CO2: 24 mEq/L (ref 19–32)
Calcium: 9.3 mg/dL (ref 8.4–10.5)
Chloride: 106 mEq/L (ref 96–112)
Creatinine, Ser: 0.71 mg/dL (ref 0.40–1.20)
GFR: 106.78 mL/min (ref >60.00)
Glucose, Bld: 81 mg/dL (ref 70–99)
Potassium: 4.1 mEq/L (ref 3.5–5.1)
Sodium: 138 mEq/L (ref 135–145)
Total Bilirubin: 0.6 mg/dL (ref 0.2–1.2)
Total Protein: 6.8 g/dL (ref 6.0–8.3)

## 2020-11-22 LAB — CBC
HCT: 40.1 % (ref 36.0–46.0)
Hemoglobin: 12.6 g/dL (ref 12.0–15.0)
MCHC: 31.5 g/dL (ref 30.0–36.0)
MCV: 73.8 fl — ABNORMAL LOW (ref 78.0–100.0)
Platelets: 231 10*3/uL (ref 150.0–400.0)
RBC: 5.43 Mil/uL — ABNORMAL HIGH (ref 3.87–5.11)
RDW: 17.3 % — ABNORMAL HIGH (ref 11.5–15.5)
WBC: 6.3 10*3/uL (ref 4.0–10.5)

## 2020-11-22 LAB — HEMOGLOBIN A1C: Hgb A1c MFr Bld: 5.6 % (ref 4.6–6.5)

## 2020-11-22 LAB — VITAMIN D 25 HYDROXY (VIT D DEFICIENCY, FRACTURES): VITD: 28.45 ng/mL — ABNORMAL LOW (ref 30.00–100.00)

## 2020-11-22 LAB — FERRITIN: Ferritin: 10.8 ng/mL (ref 10.0–291.0)

## 2020-11-22 NOTE — Patient Instructions (Addendum)
Continue to work on healthy diet and exercise and weight loss  Return in 3-4 months to meet Tabitha and have a blood pressure check  Labs today  You can call for a mammogram at these locations:  [facility]  [address] [place] [address] [phone]   [facility] [address] [place] [address]  [facility] [address] [place] [address] [phone]

## 2020-11-22 NOTE — Assessment & Plan Note (Signed)
Improved from prior with weight loss and reduced stress. Advised f/u in 4 months and if still elevated could consider medication at that time.

## 2020-11-22 NOTE — Progress Notes (Signed)
Annual Exam   Chief Complaint:  Chief Complaint  Patient presents with   Annual Exam    No concerns. Declined flu shot    History of Present Illness:  Ms. Marilyn Wilkinson is a 40 y.o. [id number] who LMP was Patient's last menstrual period was 12/13/2018., presents today for her annual examination.      Nutrition/Lifestyle Diet: pretty good, working on losing weight Exercise: walking the neighborhood 3 times a week, treadmill on other days She does get adequate calcium and Vitamin D in her diet.  Social History   Tobacco Use  Smoking Status Never  Smokeless Tobacco Never   Social History   Substance and Sexual Activity  Alcohol Use Yes   Comment: SOCIALLY   Social History   Substance and Sexual Activity  Drug Use No     Safety The patient wears seatbelts: yes.     The patient feels safe at home and in their relationships: yes.  General Health Dentist in the last year: Yes Eye doctor: yes  Menstrual S/p hysterectomy  GYN She is single partner, contraception - status post hysterectomy.     Cervical Cancer Screening (Age 10-65) Last Pap: 2017, hysterectomy  Family History of Breast Cancer: no Family History of Ovarian Cancer: no    Weight Wt Readings from Last 3 Encounters:  11/22/20 292 lb (132.5 kg)  05/02/20 (!) 321 lb 8 oz (145.8 kg)  04/11/20 (!) 323 lb 4 oz (146.6 kg)   Patient has very high BMI  BMI Readings from Last 1 Encounters:  11/22/20 46.78 kg/m     Chronic disease screening Blood pressure monitoring:  BP Readings from Last 3 Encounters:  11/22/20 (!) 120/92  05/02/20 (!) 120/96  04/11/20 (!) 160/98     Lipid Monitoring: Indication for screening: age >18, obesity, diabetes, family hx, CV risk factors.  Lipid screening: yes  Lab Results  Component Value Date   CHOL 137 07/25/2019   HDL 50 07/25/2019   LDLCALC 73 07/25/2019   TRIG 71 07/25/2019   CHOLHDL 2.7 07/25/2019     Diabetes Screening: age >10, overweight,  family hx, PCOS, hx of gestational diabetes, at risk ethnicity, elevated blood pressure >135/80.  Diabetes Screening screening: Yes  Lab Results  Component Value Date   HGBA1C 5.6 08/05/2018      Past Medical History:  Diagnosis Date   Anemia    pt. 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 reports that BP flucuates, has never had any treatment for     In vitro fertilization    Infertility of tubal origin    Polycystic ovarian syndrome    Right tubal pregnancy without intrauterine pregnancy 06/19/2017   05/29/17: single dose mtx given   Sleep apnea 2004   has lost weight & no longer having problems in this  area     Past Surgical History:  Procedure Laterality Date   CESAREAN SECTION  06/26/2011   Procedure: CESAREAN SECTION;  Surgeon: Catalina Antigua, MD;  Location: [facility];  Service: Gynecology;  Laterality: N/A;   CYSTOSCOPY N/A 12/29/2018   Procedure: CYSTOSCOPY;  Surgeon: [place] Bing, MD;  Location: [facility] OR;  Service: Gynecology;  Laterality: N/A;   DIAGNOSTIC LAPAROSCOPY  07/24/2010   REMOVAL OF BILATERAL TUBES BLOCKAGE   GASTRIC BYPASS  2004  TOTAL LAPAROSCOPIC HYSTERECTOMY WITH SALPINGECTOMY Right 12/29/2018   Procedure: TOTAL LAPAROSCOPIC HYSTERECTOMY WITH RIGHT SALPINGECTOMY;  Surgeon: [place] Bing, MD;  Location: [facility] OR;  Service: Gynecology;  Laterality: Right;    Prior to Admission medications   Medication Sig Start Date End Date Taking? Authorizing Provider  aspirin-acetaminophen-caffeine (EXCEDRIN MIGRAINE) [phone] MG tablet Take 1 tablet by mouth every 6 (six) hours as needed for headache.   Yes [provider]  cetirizine (ZYRTEC) 10 MG tablet Take 10 mg by mouth daily as needed for allergies.   Yes [provider]  Cholecalciferol (VITAMIN D3) 1.25 MG (50000 UT) TABS Take 1 tablet by mouth every  7 (seven) days. 05/02/20  Yes Copland, Karleen [place], MD  fluticasone (FLONASE) 50 MCG/ACT nasal spray Place into the nose. 05/01/20 11/22/21 Yes [provider]  vitamin B-12 (CYANOCOBALAMIN) 500 MCG tablet Take 500 mcg by mouth daily. 04/16/20  Yes [provider]    Allergies  Allergen Reactions   Shellfish Allergy Hives    Gynecologic History: Patient's last menstrual period was 12/13/2018.  Obstetric History: [id number]  Social History   Socioeconomic History   Marital status: Married    Spouse name: Not on file   Number of children: Not on file   Years of education: Not on file   Highest education level: Not on file  Occupational History   Not on file  Tobacco Use   Smoking status: Never   Smokeless tobacco: Never  Vaping Use   Vaping Use: Never used  Substance and Sexual Activity   Alcohol use: Yes    Comment: SOCIALLY   Drug use: No   Sexual activity: Yes    Birth control/protection: Surgical  Other Topics Concern   Not on file  Social History Narrative   Not on file   Social Determinants of Health   Financial Resource Strain: Not on file  Food Insecurity: Not on file  Transportation Needs: Not on file  Physical Activity: Not on file  Stress: Not on file  Social Connections: Not on file  Intimate Partner Violence: Not on file    Family History  Problem Relation Age of Onset   Asthma Mother    Diabetes Mother    Arthritis Mother    Fibromyalgia Mother    Hypertension Father    Diabetes Father    Hypertension Maternal Grandmother    Arthritis Maternal Grandmother     Review of Systems  Constitutional:  Negative for chills and fever.  HENT:  Negative for congestion and sore throat.   Eyes:  Negative for blurred vision and double vision.  Respiratory:  Negative for shortness of breath.   Cardiovascular:  Negative for chest pain.  Gastrointestinal:  Negative for heartburn, nausea and vomiting.  Genitourinary: Negative.   Musculoskeletal:  Negative.  Negative for myalgias.  Skin:  Negative for rash.  Neurological:  Negative for dizziness and headaches.  Endo/Heme/Allergies:  Does not bruise/bleed easily.  Psychiatric/Behavioral:  Negative for depression. The patient is not nervous/anxious.     Physical Exam BP (!) 120/92   Pulse 91   Temp (!) 96.2 F (35.7 C) (Temporal)   Ht 5' 6.25" (1.683 m)   Wt 292 lb (132.5 kg)   LMP 12/13/2018   SpO2 97%   Breastfeeding No   BMI 46.78 kg/m    BP Readings from Last 3 Encounters:  11/22/20 (!) 120/92  05/02/20 (!) 120/96  04/11/20 (!) 160/98    Wt Readings from Last 3 Encounters:  11/22/20 292  lb (132.5 kg)  05/02/20 (!) 321 lb 8 oz (145.8 kg)  04/11/20 (!) 323 lb 4 oz (146.6 kg)     Physical Exam Constitutional:      General: She is not in acute distress.    Appearance: She is well-developed. She is not diaphoretic.  HENT:     Head: Normocephalic and atraumatic.     Right Ear: External ear normal.     Left Ear: External ear normal.     Nose: Nose normal.  Eyes:     General: No scleral icterus.    Extraocular Movements: Extraocular movements intact.     Conjunctiva/sclera: Conjunctivae normal.  Cardiovascular:     Rate and Rhythm: Normal rate and regular rhythm.     Heart sounds: No murmur heard. Pulmonary:     Effort: Pulmonary effort is normal. No respiratory distress.     Breath sounds: Normal breath sounds. No wheezing.  Abdominal:     General: Bowel sounds are normal. There is no distension.     Palpations: Abdomen is soft. There is no mass.     Tenderness: There is no abdominal tenderness. There is no guarding or rebound.  Musculoskeletal:        General: Normal range of motion.     Cervical back: Neck supple.  Lymphadenopathy:     Cervical: No cervical adenopathy.  Skin:    General: Skin is warm and dry.     Capillary Refill: Capillary refill takes less than 2 seconds.  Neurological:     Mental Status: She is alert and oriented to person,  place, and time.     Deep Tendon Reflexes: Reflexes normal.  Psychiatric:        Mood and Affect: Mood normal.        Behavior: Behavior normal.      Results: Depression screen [facility] 2/9 11/22/2020 05/02/2020 04/11/2020  Decreased Interest 0 3 3  Down, Depressed, Hopeless 0 3 3  PHQ - 2 Score 0 6 6  Altered sleeping 0 3 3  Tired, decreased energy 1 2 3   Change in appetite 0 3 3  Feeling bad or failure about yourself  0 2 3  Trouble concentrating 0 3 3  Moving slowly or fidgety/restless 0 3 3  Suicidal thoughts 0 2 3  PHQ-9 Score 1 24 27   Difficult doing work/chores Not difficult at all Extremely dIfficult Extremely dIfficult      Assessment: 40 y.o. female here for routine annual examination.  Plan: Problem List Items Addressed This Visit       Cardiovascular and Mediastinum   HTN (hypertension)    Improved from prior with weight loss and reduced stress. Advised f/u in 4 months and if still elevated could consider medication at that time.         Other   Low serum vitamin D   Relevant Orders   Vitamin D, 25-hydroxy   Other Visit Diagnoses     Annual physical exam    -  Primary   Morbid obesity (HCC)       Relevant Orders   Lipid panel   Hemoglobin A1c   Essential hypertension       Relevant Orders   Comprehensive metabolic panel   CBC   Ferritin        Screening: -- Blood pressure screen elevated: continued to monitor. -- cholesterol screening: will obtain -- Weight screening: obese: discussed management options, including lifestyle, dietary, and exercise. -- Diabetes Screening: will obtain --  Nutrition: encouraged healthy diet   Psych -- Depression screening (PHQ-9):  Flowsheet Row Office Visit from 11/22/2020 in [facility]  PHQ-9 Total Score 1        Safety -- tobacco screening: not using -- alcohol screening:  low-risk usage. -- no evidence of domestic violence or intimate partner violence.   Cancer  Screening -- pap smear not collected per ASCCP guidelines -- family history of breast cancer screening: done. not at high risk.   Immunizations Immunization History  Administered Date(s) Administered   Influenza Split 04/14/2011, 11/24/2014   PFIZER(Purple Top)SARS-COV-2 Vaccination 07/09/2019, 08/06/2019   Tdap 06/29/2011, 07/13/2013    -- flu vaccine not up to date - declined -- TDAP q10 years up to date -- Covid-19 Vaccine not up to date - advised getting booster  Encouraged regular vision and dental screening. Encouraged healthy exercise and diet.   Lynnda Child

## 2021-02-06 ENCOUNTER — Encounter: Payer: Self-pay | Admitting: Family

## 2021-02-06 ENCOUNTER — Other Ambulatory Visit: Payer: Self-pay

## 2021-02-06 ENCOUNTER — Telehealth (INDEPENDENT_AMBULATORY_CARE_PROVIDER_SITE_OTHER): Payer: 59 | Admitting: Family

## 2021-02-06 VITALS — Ht 66.25 in | Wt 270.0 lb

## 2021-02-06 DIAGNOSIS — E282 Polycystic ovarian syndrome: Secondary | ICD-10-CM

## 2021-02-06 DIAGNOSIS — N809 Endometriosis, unspecified: Secondary | ICD-10-CM | POA: Insufficient documentation

## 2021-02-06 DIAGNOSIS — R030 Elevated blood-pressure reading, without diagnosis of hypertension: Secondary | ICD-10-CM | POA: Diagnosis not present

## 2021-02-06 DIAGNOSIS — E559 Vitamin D deficiency, unspecified: Secondary | ICD-10-CM

## 2021-02-06 DIAGNOSIS — Z9884 Bariatric surgery status: Secondary | ICD-10-CM

## 2021-02-06 DIAGNOSIS — Z6841 Body Mass Index (BMI) 40.0 and over, adult: Secondary | ICD-10-CM

## 2021-02-06 DIAGNOSIS — G43009 Migraine without aura, not intractable, without status migrainosus: Secondary | ICD-10-CM | POA: Diagnosis not present

## 2021-02-06 DIAGNOSIS — D508 Other iron deficiency anemias: Secondary | ICD-10-CM

## 2021-02-06 DIAGNOSIS — R635 Abnormal weight gain: Secondary | ICD-10-CM

## 2021-02-06 NOTE — Assessment & Plan Note (Signed)
Work on diet and exercise 

## 2021-02-06 NOTE — Progress Notes (Signed)
MyChart Video Visit    Virtual Visit via Video Note   This visit type was conducted due to national recommendations for restrictions regarding the COVID-19 Pandemic (e.g. social distancing) in an effort to limit this patient's exposure and mitigate transmission in our community. This patient is at least at moderate risk for complications without adequate follow up. This format is felt to be most appropriate for this patient at this time. Physical exam was limited by quality of the video and audio technology used for the visit. CMA was able to get the patient set up on a video visit.  Patient location: Home. Patient and provider in visit Provider location: Office  I discussed the limitations of evaluation and management by telemedicine and the availability of in person appointments. The patient expressed understanding and agreed to proceed.  Visit Date: 02/06/2021  Today's healthcare provider: Eugenia Pancoast, FNP   Subjective:  Patient ID: Marilyn Wilkinson, female    DOB: Nov 15, 1980  Age: 40 y.o. MRN: [id number]  CC:  Chief Complaint  Patient presents with   Transitions Of Care   Hypertension    Trying to get on a medication to control her high bp.     HPI Marilyn Wilkinson is here today for a transition of care visit.  Patient was previously seeing Marina Gravel, [place]. Pt is with acute concerns.  Went to a visit a few weeks ago for her dentist, and had elevated blood pressure, and it seems to remain elevated. She has a blood pressure machine at home, but not sure if it is working or not. She does not have a record with her to review with me. Denies CP palp and or sob, but she does state a lot of headaches, mainly temporal which she thinks may be related to her blood pressure. She does have h/o migraines since she was a teenager, but this feels a bit different. Migraines seem to be menstrual related. These temporal headaches have been recently a few times each week, dull and achy, takes  excedrin with some relief.   Chronic problems addressed today:  Migraines: mainly in the summer 2-3 times a week, mainly triggered with heat. Otherwise typically does not experience them throughout the other seasons. Denies aura, just light sensitivity. Puts on shades and or lies down and that helps when they occur. No loss or change in vision during these times.   Alpha thal trait: saw hematologist, had iron infusions once a month in the past. Hasn't seen them in over a year, has appt for f/u with them in one month. Doesn't take iron orally bc it causes constipation, and she is a gastric bypass history pt. Doesn't like oral iron.   Endometriosis: sees Dr. Ilda Basset at [facility] at [facility]. Will f/u with him this next year 2023.   Pt states last pap smear in June of 2021, per her pap was wnl.   Past Medical History:  Diagnosis Date   Anemia    pt. 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 reports that BP flucuates, has never had any treatment for     In vitro fertilization    Infertility of tubal origin    Polycystic ovarian syndrome    Right tubal pregnancy without intrauterine pregnancy 06/19/2017  05/29/17: single dose mtx given   Sleep apnea 2004   has lost weight & no longer having problems in this  area    Status post hysterectomy 12/29/2018    Past Surgical History:  Procedure Laterality Date   CESAREAN SECTION  06/26/2011   Procedure: CESAREAN SECTION;  Surgeon: Mora Bellman, MD;  Location: [facility];  Service: Gynecology;  Laterality: N/A;   CYSTOSCOPY N/A 12/29/2018   Procedure: CYSTOSCOPY;  Surgeon: Aletha Halim, MD;  Location: [place];  Service: Gynecology;  Laterality: N/A;   DIAGNOSTIC LAPAROSCOPY  07/24/2010   REMOVAL OF BILATERAL TUBES BLOCKAGE   GASTRIC BYPASS  2004   TOTAL LAPAROSCOPIC HYSTERECTOMY WITH  SALPINGECTOMY Right 12/29/2018   Procedure: TOTAL LAPAROSCOPIC HYSTERECTOMY WITH RIGHT SALPINGECTOMY;  Surgeon: Aletha Halim, MD;  Location: [place];  Service: Gynecology;  Laterality: Right;    Family History  Problem Relation Age of Onset   Asthma Mother    Diabetes Mother    Arthritis Mother    Fibromyalgia Mother    COPD Mother        passed at age 48 y/o, hypoxia   Hypertension Father    Diabetes Father    Hypertension Maternal Grandmother    Arthritis Maternal Grandmother     Social History   Socioeconomic History   Marital status: Married    Spouse name: Not on file   Number of children: 2   Years of education: Not on file   Highest education level: Not on file  Occupational History   Occupation: home maker  Tobacco Use   Smoking status: Never   Smokeless tobacco: Never  Vaping Use   Vaping Use: Never used  Substance and Sexual Activity   Alcohol use: Yes    Comment: SOCIALLY   Drug use: No   Sexual activity: Yes    Birth control/protection: Surgical  Other Topics Concern   Not on file  Social History Narrative   Two twin girls   Social Determinants of Health   Financial Resource Strain: Not on file  Food Insecurity: Not on file  Transportation Needs: Not on file  Physical Activity: Not on file  Stress: Not on file  Social Connections: Not on file  Intimate Partner Violence: Not on file    Outpatient Medications Prior to Visit  Medication Sig Dispense Refill   aspirin-acetaminophen-caffeine (EXCEDRIN MIGRAINE) 250-250-65 MG tablet Take 1 tablet by mouth every 6 (six) hours as needed for headache.     cetirizine (ZYRTEC) 10 MG tablet Take 10 mg by mouth daily as needed for allergies.     Cholecalciferol (VITAMIN D3) 1.25 MG (50000 UT) TABS Take 1 tablet by mouth every 7 (seven) days. 12 tablet 3   fluticasone (FLONASE) 50 MCG/ACT nasal spray Place into the nose.     vitamin B-12 (CYANOCOBALAMIN) 500 MCG tablet Take 500 mcg by mouth daily.     No  facility-administered medications prior to visit.    Allergies  Allergen Reactions   Shellfish Allergy Hives    ROS Review of Systems  Constitutional:  Positive for unexpected weight change (hard to lose weight). Negative for chills and fever.  Eyes:  Negative for pain and visual disturbance.  Respiratory:  Negative for shortness of breath.   Cardiovascular:  Negative for chest pain, palpitations and leg swelling.  Neurological:  Positive for headaches (dull aching at times intermittently).  All other systems reviewed and are negative.  Review of Systems  Respiratory:  Negative for shortness of breath.  Cardiovascular:  Negative for chest pain and palpitations.  Gastrointestinal:  Negative for constipation and diarrhea.  Genitourinary:  Negative for dysuria, frequency and urgency.  Musculoskeletal:  Negative for myalgias.  Psychiatric/Behavioral:  Negative for depression and suicidal ideas.   All other systems reviewed and are negative.    Objective:    Physical Exam Vitals reviewed.  Constitutional:      General: She is not in acute distress.    Appearance: Normal appearance. She is obese. She is not ill-appearing, toxic-appearing or diaphoretic.  Pulmonary:     Effort: Pulmonary effort is normal.  Neurological:     General: No focal deficit present.     Mental Status: She is alert.  Psychiatric:        Mood and Affect: Mood normal.        Behavior: Behavior normal.        Thought Content: Thought content normal.        Judgment: Judgment normal.    Gen: NAD, resting comfortably HEENT: TMs normal bilaterally. OP clear. No thyromegaly noted.  CV: RRR with no murmurs appreciated Pulm: NWOB, CTAB with no crackles, wheezes, or rhonchi GI: Normal bowel sounds present. Soft, Nontender, Nondistended. MSK: no edema, cyanosis, or clubbing noted Skin: warm, dry Neuro: CN2-12 grossly intact. Strength 5/5 in upper and lower extremities.  Psych: Normal affect and thought  content  Ht 5' 6.25" (1.683 m)    Wt 270 lb (122.5 kg)    LMP 12/13/2018    BMI 43.25 kg/m  Wt Readings from Last 3 Encounters:  02/06/21 270 lb (122.5 kg)  11/22/20 292 lb (132.5 kg)  05/02/20 (!) 321 lb 8 oz (145.8 kg)     There are no preventive care reminders to display for this patient.   There are no preventive care reminders to display for this patient.  Lab Results  Component Value Date   TSH 1.700 07/25/2019   Lab Results  Component Value Date   WBC 6.3 11/22/2020   HGB 12.6 11/22/2020   HCT 40.1 11/22/2020   MCV 73.8 (L) 11/22/2020   PLT 231.0 11/22/2020   Lab Results  Component Value Date   NA 138 11/22/2020   K 4.1 11/22/2020   CO2 24 11/22/2020   GLUCOSE 81 11/22/2020   BUN 14 11/22/2020   CREATININE 0.71 11/22/2020   BILITOT 0.6 11/22/2020   ALKPHOS 55 11/22/2020   AST 16 11/22/2020   ALT 12 11/22/2020   PROT 6.8 11/22/2020   ALBUMIN 4.1 11/22/2020   CALCIUM 9.3 11/22/2020   ANIONGAP 8 04/12/2019   GFR 106.78 11/22/2020   Lab Results  Component Value Date   CHOL 123 11/22/2020   Lab Results  Component Value Date   HDL 49.30 11/22/2020   Lab Results  Component Value Date   LDLCALC 64 11/22/2020   Lab Results  Component Value Date   TRIG 49.0 11/22/2020   Lab Results  Component Value Date   CHOLHDL 2 11/22/2020   Lab Results  Component Value Date   HGBA1C 5.6 11/22/2020      Assessment & Plan:   Problem List Items Addressed This Visit       Cardiovascular and Mediastinum   Transient hypertension    New, pt advised of the following: Start monitoring your blood pressure daily, around the same time of day, for the next 2-3 weeks.  Ensure that you have rested for 30 minutes prior to checking your blood pressure. Record your readings and bring them  to your next visit.       Migraine without aura and responsive to treatment    Stable, headaches at current suspected to be blood pressure related. Pt advised to keep headache log  for next two weeks and f/u with the log. Will go over, if not related to blood pressure will consider workup.        Endocrine   PCOS (polycystic ovarian syndrome)    Stable, although making it hard for pt to lose weight. Consider discussing metformin next visit. Will continue to monitor hga1c.        Other   BMI 45.0-49.9, adult ([place])    Work on diet and exercise.      Vitamin D deficiency    Continue otc vitamin D3, suggested 2000 IU daily for maint. Will continue to monitor with lab work.      Weight gain following gastric bypass surgery    Will assess at f/u visit as this was video only visit.      IDA (iron deficiency anemia)    F/u with hematologist for iv iron prn. Has appt scheduled in January 2023. Pt unable to tolerate oral iron.      Endometriosis - Primary    F/u with gyn accordingly for paps, stable now since hysterectomy.       A total of 45 minutes was spent face to face with this patient encounter.  Follow-up: No follow-ups on file.    Eugenia Pancoast, FNP

## 2021-02-06 NOTE — Assessment & Plan Note (Signed)
Stable, headaches at current suspected to be blood pressure related. Pt advised to keep headache log for next two weeks and f/u with the log. Will go over, if not related to blood pressure will consider workup.

## 2021-02-06 NOTE — Assessment & Plan Note (Signed)
F/u with gyn accordingly for paps, stable now since hysterectomy.

## 2021-02-06 NOTE — Assessment & Plan Note (Signed)
Stable, although making it hard for pt to lose weight. Consider discussing metformin next visit. Will continue to monitor hga1c.

## 2021-02-06 NOTE — Assessment & Plan Note (Signed)
New, pt advised of the following: Start monitoring your blood pressure daily, around the same time of day, for the next 2-3 weeks.  Ensure that you have rested for 30 minutes prior to checking your blood pressure. Record your readings and bring them to your next visit.

## 2021-02-06 NOTE — Progress Notes (Signed)
MyChart Video Visit    Virtual Visit via Video Note   This visit type was conducted due to national recommendations for restrictions regarding the COVID-19 Pandemic (e.g. social distancing) in an effort to limit this patient's exposure and mitigate transmission in our community. This patient is at least at moderate risk for complications without adequate follow up. This format is felt to be most appropriate for this patient at this time. Physical exam was limited by quality of the video and audio technology used for the visit. CMA was able to get the patient set up on a video visit.  Patient location: Home. Patient and provider in visit Provider location: Office  I discussed the limitations of evaluation and management by telemedicine and the availability of in person appointments. The patient expressed understanding and agreed to proceed.  Visit Date: 02/06/2021  Today's healthcare provider: Mort Sawyers, FNP     Subjective:    Patient ID: Marilyn Wilkinson, female    DOB: Mar 31, 1980, 40 y.o.   MRN: [id number]  Chief Complaint  Patient presents with   Transitions Of Care   Hypertension    Trying to get on a medication to control her high bp.     HPI  Past Medical History:  Diagnosis Date   Anemia    pt. 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 reports that BP flucuates, has never had any treatment for     In vitro fertilization    Infertility of tubal origin    Polycystic ovarian syndrome    Right tubal pregnancy without intrauterine pregnancy 06/19/2017   05/29/17: single dose mtx given   Sleep apnea 2004   has lost weight & no longer having problems in this  area    Status post hysterectomy 12/29/2018    Past Surgical History:  Procedure Laterality Date   CESAREAN SECTION  06/26/2011   Procedure: CESAREAN SECTION;  Surgeon:  Catalina Antigua, MD;  Location: [facility];  Service: Gynecology;  Laterality: N/A;   CYSTOSCOPY N/A 12/29/2018   Procedure: CYSTOSCOPY;  Surgeon: [place] Bing, MD;  Location: [facility];  Service: Gynecology;  Laterality: N/A;   DIAGNOSTIC LAPAROSCOPY  07/24/2010   REMOVAL OF BILATERAL TUBES BLOCKAGE   GASTRIC BYPASS  2004   TOTAL LAPAROSCOPIC HYSTERECTOMY WITH SALPINGECTOMY Right 12/29/2018   Procedure: TOTAL LAPAROSCOPIC HYSTERECTOMY WITH RIGHT SALPINGECTOMY;  Surgeon: [place] Bing, MD;  Location: [facility];  Service: Gynecology;  Laterality: Right;    Family History  Problem Relation Age of Onset   Asthma Mother    Diabetes Mother    Arthritis Mother    Fibromyalgia Mother    COPD Mother        passed at age 11 y/o, hypoxia   Hypertension Father    Diabetes Father    Hypertension Maternal Grandmother    Arthritis Maternal Grandmother     Social History   Socioeconomic History   Marital status: Married    Spouse name: Not on file   Number of children: 2   Years of education: Not on file   Highest education level: Not on file  Occupational History   Occupation: home maker  Tobacco Use   Smoking status: Never   Smokeless tobacco: Never  Vaping Use   Vaping Use: Never used  Substance  and Sexual Activity   Alcohol use: Yes    Comment: SOCIALLY   Drug use: No   Sexual activity: Yes    Birth control/protection: Surgical  Other Topics Concern   Not on file  Social History Narrative   Two twin girls   Social Determinants of Health   Financial Resource Strain: Not on file  Food Insecurity: Not on file  Transportation Needs: Not on file  Physical Activity: Not on file  Stress: Not on file  Social Connections: Not on file  Intimate Partner Violence: Not on file    Outpatient Medications Prior to Visit  Medication Sig Dispense Refill   aspirin-acetaminophen-caffeine (EXCEDRIN MIGRAINE) 250-250-65 MG tablet Take 1 tablet by mouth every 6 (six) hours as needed for  headache.     cetirizine (ZYRTEC) 10 MG tablet Take 10 mg by mouth daily as needed for allergies.     Cholecalciferol (VITAMIN D3) 1.25 MG (50000 UT) TABS Take 1 tablet by mouth every 7 (seven) days. 12 tablet 3   fluticasone (FLONASE) 50 MCG/ACT nasal spray Place into the nose.     vitamin B-12 (CYANOCOBALAMIN) 500 MCG tablet Take 500 mcg by mouth daily.     No facility-administered medications prior to visit.    Allergies  Allergen Reactions   Shellfish Allergy Hives    Review of Systems  Constitutional:  Negative for chills and fever.  Eyes:  Negative for blurred vision.  Respiratory:  Negative for cough and shortness of breath.   Cardiovascular:  Negative for chest pain and leg swelling.  Gastrointestinal:  Negative for constipation and diarrhea.  Skin:  Negative for rash.  Neurological:  Negative for dizziness and headaches.      Objective:    Physical Exam Constitutional:      General: She is not in acute distress.    Appearance: Normal appearance. She is obese. She is not ill-appearing, toxic-appearing or diaphoretic.  HENT:     Head: Normocephalic.  Pulmonary:     Effort: Pulmonary effort is normal.  Neurological:     General: No focal deficit present.     Mental Status: She is alert and oriented to person, place, and time.  Psychiatric:        Mood and Affect: Mood normal.        Behavior: Behavior normal.        Thought Content: Thought content normal.        Judgment: Judgment normal.    Ht 5' 6.25" (1.683 m)    Wt 270 lb (122.5 kg)    LMP 12/13/2018    BMI 43.25 kg/m  Wt Readings from Last 3 Encounters:  02/06/21 270 lb (122.5 kg)  11/22/20 292 lb (132.5 kg)  05/02/20 (!) 321 lb 8 oz (145.8 kg)       Assessment & Plan:   Problem List Items Addressed This Visit       Cardiovascular and Mediastinum   Transient hypertension    New, pt advised of the following: Start monitoring your blood pressure daily, around the same time of day, for the next 2-3  weeks.  Ensure that you have rested for 30 minutes prior to checking your blood pressure. Record your readings and bring them to your next visit.       Migraine without aura and responsive to treatment    Stable, headaches at current suspected to be blood pressure related. Pt advised to keep headache log for next two weeks and f/u with the log. Will  go over, if not related to blood pressure will consider workup.        Endocrine   PCOS (polycystic ovarian syndrome)    Stable, although making it hard for pt to lose weight. Consider discussing metformin next visit. Will continue to monitor hga1c.        Other   BMI 45.0-49.9, adult ([facility])    Work on diet and exercise.      Vitamin D deficiency    Continue otc vitamin D3, suggested 2000 IU daily for maint. Will continue to monitor with lab work.      Weight gain following gastric bypass surgery    Will assess at f/u visit as this was video only visit.      IDA (iron deficiency anemia)    F/u with hematologist for iv iron prn. Has appt scheduled in January 2023. Pt unable to tolerate oral iron.      Endometriosis - Primary    F/u with gyn accordingly for paps, stable now since hysterectomy.       I am having Marilyn Wilkinson maintain her aspirin-acetaminophen-caffeine, cetirizine, fluticasone, vitamin B-12, and Vitamin D3.  No orders of the defined types were placed in this encounter.   I discussed the assessment and treatment plan with the patient. The patient was provided an opportunity to ask questions and all were answered. The patient agreed with the plan and demonstrated an understanding of the instructions.   The patient was advised to call back or seek an in-person evaluation if the symptoms worsen or if the condition fails to improve as anticipated.  I provided 45 minutes of face-to-face time during this encounter.   Mort Sawyers, FNP [facility] [place] [phone] (phone) [phone]  (fax)  [facility]

## 2021-02-06 NOTE — Assessment & Plan Note (Signed)
F/u with hematologist for iv iron prn. Has appt scheduled in January 2023. Pt unable to tolerate oral iron.

## 2021-02-06 NOTE — Assessment & Plan Note (Signed)
Will assess at f/u visit as this was video only visit.

## 2021-02-06 NOTE — Assessment & Plan Note (Signed)
Continue otc vitamin D3, suggested 2000 IU daily for maint. Will continue to monitor with lab work.

## 2021-02-07 ENCOUNTER — Encounter: Payer: Self-pay | Admitting: Oncology

## 2021-02-14 ENCOUNTER — Telehealth: Payer: Self-pay | Admitting: Physician Assistant

## 2021-02-14 ENCOUNTER — Encounter: Payer: Self-pay | Admitting: Oncology

## 2021-02-14 DIAGNOSIS — U071 COVID-19: Secondary | ICD-10-CM

## 2021-02-14 MED ORDER — MOLNUPIRAVIR EUA 200MG CAPSULE: 4.0000 | ORAL_CAPSULE | Freq: Two times a day (BID) | ORAL | 0 refills | Status: AC

## 2021-02-14 MED ORDER — ALBUTEROL SULFATE HFA 108 (90 BASE) MCG/ACT IN AERS: 2.0000 | INHALATION_SPRAY | Freq: Four times a day (QID) | RESPIRATORY_TRACT | 0 refills | Status: DC | PRN

## 2021-02-14 NOTE — Patient Instructions (Signed)
[name], thank you for joining Piedad Climes, PA-C for today's virtual visit.  While this provider is not your primary care provider (PCP), if your PCP is located in our provider database this encounter information will be shared with them immediately following your visit.  Consent: (Patient) Marilyn Wilkinson provided verbal consent for this virtual visit at the beginning of the encounter.  Current Medications:  Current Outpatient Medications:    aspirin-acetaminophen-caffeine (EXCEDRIN MIGRAINE) 250-250-65 MG tablet, Take 1 tablet by mouth every 6 (six) hours as needed for headache., Disp: , Rfl:    cetirizine (ZYRTEC) 10 MG tablet, Take 10 mg by mouth daily as needed for allergies., Disp: , Rfl:    Cholecalciferol (VITAMIN D3) 1.25 MG (50000 UT) TABS, Take 1 tablet by mouth every 7 (seven) days., Disp: 12 tablet, Rfl: 3   fluticasone (FLONASE) 50 MCG/ACT nasal spray, Place into the nose., Disp: , Rfl:    vitamin B-12 (CYANOCOBALAMIN) 500 MCG tablet, Take 500 mcg by mouth daily., Disp: , Rfl:    Medications ordered in this encounter:  No orders of the defined types were placed in this encounter.    *If you need refills on other medications prior to your next appointment, please contact your pharmacy*  Follow-Up: Call back or seek an in-person evaluation if the symptoms worsen or if the condition fails to improve as anticipated.  Other Instructions Please keep well-hydrated and get plenty of rest. Start a saline nasal rinse to flush out your nasal passages. You can use plain Mucinex to help thin congestion. If you have a humidifier, running in the bedroom at night. I want you to start OTC vitamin D3 1000 units daily, vitamin C 1000 mg daily, and a zinc supplement. Please take prescribed medications as directed.  You have been enrolled in a MyChart symptom monitoring program. Please answer these questions daily so we can keep track of how you are doing.  You were to  quarantine for 5 days from onset of your symptoms.  After day 5, if you have had no fever and you are feeling better, you can end quarantine but need to mask for an additional 5 days. After day 5 if you have a fever or are having significant symptoms, please quarantine for full 10 days.  If you note any worsening of symptoms, any significant shortness of breath or any chest pain, please seek ER evaluation ASAP.  Please do not delay care!  COVID-19: What to Do if You Are Sick If you test positive and are an older adult or someone who is at high risk of getting very sick from COVID-19, treatment may be available. Contact a healthcare provider right away after a positive test to determine if you are eligible, even if your symptoms are mild right now. You can also visit a Test to Treat location and, if eligible, receive a prescription from a provider. Don't delay: Treatment must be started within the first few days to be effective. If you have a fever, cough, or other symptoms, you might have COVID-19. Most people have mild illness and are able to recover at home. If you are sick: Keep track of your symptoms. If you have an emergency warning sign (including trouble breathing), call 911. Steps to help prevent the spread of COVID-19 if you are sick If you are sick with COVID-19 or think you might have COVID-19, follow the steps below to care for yourself and to help protect other people in your home and  community. Stay home except to get medical care Stay home. Most people with COVID-19 have mild illness and can recover at home without medical care. Do not leave your home, except to get medical care. Do not visit public areas and do not go to places where you are unable to wear a mask. Take care of yourself. Get rest and stay hydrated. Take over-the-counter medicines, such as acetaminophen, to help you feel better. Stay in touch with your doctor. Call before you get medical care. Be sure to get care if you  have trouble breathing, or have any other emergency warning signs, or if you think it is an emergency. Avoid public transportation, ride-sharing, or taxis if possible. Get tested If you have symptoms of COVID-19, get tested. While waiting for test results, stay away from others, including staying apart from those living in your household. Get tested as soon as possible after your symptoms start. Treatments may be available for people with COVID-19 who are at risk for becoming very sick. Don't delay: Treatment must be started early to be effective--some treatments must begin within 5 days of your first symptoms. Contact your healthcare provider right away if your test result is positive to determine if you are eligible. Self-tests are one of several options for testing for the virus that causes COVID-19 and may be more convenient than laboratory-based tests and point-of-care tests. Ask your healthcare provider or your local health department if you need help interpreting your test results. You can visit your state, tribal, local, and territorial health department's website to look for the latest local information on testing sites. Separate yourself from other people As much as possible, stay in a specific room and away from other people and pets in your home. If possible, you should use a separate bathroom. If you need to be around other people or animals in or outside of the home, wear a well-fitting mask. Tell your close contacts that they may have been exposed to COVID-19. An infected person can spread COVID-19 starting 48 hours (or 2 days) before the person has any symptoms or tests positive. By letting your close contacts know they may have been exposed to COVID-19, you are helping to protect everyone. See COVID-19 and Animals if you have questions about pets. If you are diagnosed with COVID-19, someone from the health department may call you. Answer the call to slow the spread. Monitor your  symptoms Symptoms of COVID-19 include fever, cough, or other symptoms. Follow care instructions from your healthcare provider and local health department. Your local health authorities may give instructions on checking your symptoms and reporting information. When to seek emergency medical attention Look for emergency warning signs* for COVID-19. If someone is showing any of these signs, seek emergency medical care immediately: Trouble breathing Persistent pain or pressure in the chest New confusion Inability to wake or stay awake Pale, gray, or blue-colored skin, lips, or nail beds, depending on skin tone *This list is not all possible symptoms. Please call your medical provider for any other symptoms that are severe or concerning to you. Call 911 or call ahead to your local emergency facility: Notify the operator that you are seeking care for someone who has or may have COVID-19. Call ahead before visiting your doctor Call ahead. Many medical visits for routine care are being postponed or done by phone or telemedicine. If you have a medical appointment that cannot be postponed, call your doctor's office, and tell them you have or may have COVID-19.  This will help the office protect themselves and other patients. If you are sick, wear a well-fitting mask You should wear a mask if you must be around other people or animals, including pets (even at home). Wear a mask with the best fit, protection, and comfort for you. You don't need to wear the mask if you are alone. If you can't put on a mask (because of trouble breathing, for example), cover your coughs and sneezes in some other way. Try to stay at least 6 feet away from other people. This will help protect the people around you. Masks should not be placed on young children under age 70 years, anyone who has trouble breathing, or anyone who is not able to remove the mask without help. Cover your coughs and sneezes Cover your mouth and nose with  a tissue when you cough or sneeze. Throw away used tissues in a lined trash can. Immediately wash your hands with soap and water for at least 20 seconds. If soap and water are not available, clean your hands with an alcohol-based hand sanitizer that contains at least 60% alcohol. Clean your hands often Wash your hands often with soap and water for at least 20 seconds. This is especially important after blowing your nose, coughing, or sneezing; going to the bathroom; and before eating or preparing food. Use hand sanitizer if soap and water are not available. Use an alcohol-based hand sanitizer with at least 60% alcohol, covering all surfaces of your hands and rubbing them together until they feel dry. Soap and water are the best option, especially if hands are visibly dirty. Avoid touching your eyes, nose, and mouth with unwashed hands. Handwashing Tips Avoid sharing personal household items Do not share dishes, drinking glasses, cups, eating utensils, towels, or bedding with other people in your home. Wash these items thoroughly after using them with soap and water or put in the dishwasher. Clean surfaces in your home regularly Clean and disinfect high-touch surfaces (for example, doorknobs, tables, handles, light switches, and countertops) in your "sick room" and bathroom. In shared spaces, you should clean and disinfect surfaces and items after each use by the person who is ill. If you are sick and cannot clean, a caregiver or other person should only clean and disinfect the area around you (such as your bedroom and bathroom) on an as needed basis. Your caregiver/other person should wait as long as possible (at least several hours) and wear a mask before entering, cleaning, and disinfecting shared spaces that you use. Clean and disinfect areas that may have blood, stool, or body fluids on them. Use household cleaners and disinfectants. Clean visible dirty surfaces with household cleaners containing  soap or detergent. Then, use a household disinfectant. Use a product from [employer] List N: Disinfectants for Coronavirus (COVID-19). Be sure to follow the instructions on the label to ensure safe and effective use of the product. Many products recommend keeping the surface wet with a disinfectant for a certain period of time (look at "contact time" on the product label). You may also need to wear personal protective equipment, such as gloves, depending on the directions on the product label. Immediately after disinfecting, wash your hands with soap and water for 20 seconds. For completed guidance on cleaning and disinfecting your home, visit Complete Disinfection Guidance. Take steps to improve ventilation at home Improve ventilation (air flow) at home to help prevent from spreading COVID-19 to other people in your household. Clear out COVID-19 virus particles in the  air by opening windows, using air filters, and turning on fans in your home. Use this interactive tool to learn how to improve air flow in your home. When you can be around others after being sick with COVID-19 Deciding when you can be around others is different for different situations. Find out when you can safely end home isolation. For any additional questions about your care, contact your healthcare provider or state or local health department. 05/01/2020 Content source: [facility] Center for Immunization and Respiratory Diseases (NCIRD), Division of Viral Diseases This information is not intended to replace advice given to you by your health care provider. Make sure you discuss any questions you have with your health care provider. Document Revised: 06/14/2020 Document Reviewed: 06/14/2020 Elsevier Patient Education  2022 [employer]      If you have been instructed to have an in-person evaluation today at a local Urgent Care facility, please use the link below. It will take you to a list of all of our available [facility], including address, phone number and hours of operation. Please do not delay care.  [facility]  If you or a family member do not have a primary care provider, use the link below to schedule a visit and establish care. When you choose a [facility] primary care physician or advanced practice provider, you gain a long-term partner in health. Find a Primary Care Provider  Learn more about [facility]'s in-office and virtual care options: [facility] - Get Care Now

## 2021-02-14 NOTE — Progress Notes (Signed)
Virtual Visit Consent   Marilyn Wilkinson, you are scheduled for a virtual visit with a [facility] provider today.     Just as with appointments in the office, your consent must be obtained to participate.  Your consent will be active for this visit and any virtual visit you may have with one of our providers in the next 365 days.     If you have a MyChart account, a copy of this consent can be sent to you electronically.  All virtual visits are billed to your insurance company just like a traditional visit in the office.    As this is a virtual visit, video technology does not allow for your provider to perform a traditional examination.  This may limit your provider's ability to fully assess your condition.  If your provider identifies any concerns that need to be evaluated in person or the need to arrange testing (such as labs, EKG, etc.), we will make arrangements to do so.     Although advances in technology are sophisticated, we cannot ensure that it will always work on either your end or our end.  If the connection with a video visit is poor, the visit may have to be switched to a telephone visit.  With either a video or telephone visit, we are not always able to ensure that we have a secure connection.     I need to obtain your verbal consent now.   Are you willing to proceed with your visit today?    Marilyn Wilkinson has provided verbal consent on 02/14/2021 for a virtual visit (video or telephone).   Piedad Climes, [place]   Date: 02/14/2021 4:05 PM   Virtual Visit via Video Note   I, Piedad Climes, connected with  Marilyn Wilkinson  ([id number], 08-20-1980) on 02/14/21 at  4:05 PM EST by a video-enabled telemedicine application and verified that I am speaking with the correct person using two identifiers.  Location: Patient: Virtual Visit Location Patient: Home Provider: Virtual Visit Location Provider: Home Office   I discussed the limitations of evaluation and management by  telemedicine and the availability of in person appointments. The patient expressed understanding and agreed to proceed.    History of Present Illness: Marilyn Wilkinson is a 41 y.o. who identifies as a female who was assigned female at birth, and is being seen today for COVID-19. Patient endorses nasal and head congestion with cough, sore throat, aches, chills and low-grade fever starting Tuesday afternoon/evening. Has noted some occasional very mild SOB with exertion. Denies GI symptoms, chest pain. Has been taking zyrtec OTC. Tested positive via home COVID test earlier today.  HPI: HPI  Problems:  Patient Active Problem List   Diagnosis Date Noted   Endometriosis 02/06/2021   Migraine without aura and responsive to treatment 02/06/2021   Alpha thalassemia trait 07/25/2019   Microcytosis 02/03/2019   IDA (iron deficiency anemia) 02/03/2019   Vitamin D deficiency 01/24/2019   Transient hypertension 10/14/2018   BMI 45.0-49.9, adult (HCC) 05/25/2017   Weight gain following gastric bypass surgery 01/23/2015   PCOS (polycystic ovarian syndrome) 03/03/2011    Allergies:  Allergies  Allergen Reactions   Shellfish Allergy Hives   Medications:  Current Outpatient Medications:    aspirin-acetaminophen-caffeine (EXCEDRIN MIGRAINE) 250-250-65 MG tablet, Take 1 tablet by mouth every 6 (six) hours as needed for headache., Disp: , Rfl:    cetirizine (ZYRTEC) 10 MG tablet, Take 10 mg by mouth daily as needed  for allergies., Disp: , Rfl:    vitamin B-12 (CYANOCOBALAMIN) 500 MCG tablet, Take 500 mcg by mouth daily., Disp: , Rfl:   Observations/Objective: Patient is well-developed, well-nourished in no acute distress.  Resting comfortably at home.  Head is normocephalic, atraumatic.  No labored breathing. Speech is clear and coherent with logical content.  Patient is alert and oriented at baseline.   Assessment and Plan: 1. COVID-19 - MyChart COVID-19 home monitoring program; Future  Patient  with multiple risk factors for complicated course of illness. Discussed risks/benefits of antiviral medications including most common potential ADRs. Patient voiced understanding and would like to proceed with antiviral medication. They are candidate for molnupiravir. Rx sent to pharmacy. Supportive measures, OTC medications and vitamin regimen reviewed. Tessalon per orders. Patient has been enrolled in a MyChart COVID symptom monitoring program. Anne Shutter reviewed in detail. Strict ER precautions discussed with patient.    Follow Up Instructions: I discussed the assessment and treatment plan with the patient. The patient was provided an opportunity to ask questions and all were answered. The patient agreed with the plan and demonstrated an understanding of the instructions.  A copy of instructions were sent to the patient via MyChart unless otherwise noted below.   The patient was advised to call back or seek an in-person evaluation if the symptoms worsen or if the condition fails to improve as anticipated.  Time:  I spent 10 minutes with the patient via telehealth technology discussing the above problems/concerns.    Piedad Climes, PA-C

## 2021-02-15 ENCOUNTER — Ambulatory Visit: Payer: Self-pay | Admitting: Nurse Practitioner

## 2021-02-22 ENCOUNTER — Telehealth: Payer: Self-pay | Admitting: Family

## 2021-02-22 ENCOUNTER — Other Ambulatory Visit: Payer: Self-pay | Admitting: Family

## 2021-02-22 ENCOUNTER — Encounter: Payer: Self-pay | Admitting: Family

## 2021-02-22 DIAGNOSIS — Z1231 Encounter for screening mammogram for malignant neoplasm of breast: Secondary | ICD-10-CM

## 2021-02-22 NOTE — Telephone Encounter (Signed)
Pt would like an order placed for a mammogram to [facility] , she said this was discussed at her CPE

## 2021-02-22 NOTE — Telephone Encounter (Signed)
Responded to pt via mychart

## 2021-02-27 ENCOUNTER — Telehealth: Payer: Self-pay

## 2021-02-27 NOTE — Telephone Encounter (Signed)
Pt. Reports most of her symptoms are gone, except for nausea and shortness of breath while going up stairs. Instructed to reach out to her PCP and discuss lingering symptoms. Instructed to call 911 for chest pain or increased shortness of breath. Verbalizes understanding.

## 2021-02-27 NOTE — Telephone Encounter (Signed)
COVID questionnaire triggered a BPA. Attempted to call pt. Mailbox is full, unable to leave message.

## 2021-04-03 ENCOUNTER — Telehealth: Payer: 59 | Admitting: Physician Assistant

## 2021-04-03 DIAGNOSIS — J0100 Acute maxillary sinusitis, unspecified: Secondary | ICD-10-CM | POA: Diagnosis not present

## 2021-04-03 MED ORDER — FLUTICASONE PROPIONATE 50 MCG/ACT NA SUSP: 2.0000 | Freq: Every day | NASAL | 0 refills | Status: DC

## 2021-04-03 MED ORDER — AMOXICILLIN-POT CLAVULANATE 875-125 MG PO TABS: 1.0000 | ORAL_TABLET | Freq: Two times a day (BID) | ORAL | 0 refills | Status: DC

## 2021-04-03 NOTE — Progress Notes (Signed)
Virtual Visit Consent   Marilyn Wilkinson, you are scheduled for a virtual visit with a [facility] provider today.     Just as with appointments in the office, your consent must be obtained to participate.  Your consent will be active for this visit and any virtual visit you may have with one of our providers in the next 365 days.     If you have a MyChart account, a copy of this consent can be sent to you electronically.  All virtual visits are billed to your insurance company just like a traditional visit in the office.    As this is a virtual visit, video technology does not allow for your provider to perform a traditional examination.  This may limit your provider's ability to fully assess your condition.  If your provider identifies any concerns that need to be evaluated in person or the need to arrange testing (such as labs, EKG, etc.), we will make arrangements to do so.     Although advances in technology are sophisticated, we cannot ensure that it will always work on either your end or our end.  If the connection with a video visit is poor, the visit may have to be switched to a telephone visit.  With either a video or telephone visit, we are not always able to ensure that we have a secure connection.     I need to obtain your verbal consent now.   Are you willing to proceed with your visit today?    Marilyn Wilkinson has provided verbal consent on 04/03/2021 for a virtual visit (video or telephone).   Marilyn Wilkinson, [place]   Date: 04/03/2021 11:37 AM   Virtual Visit via Video Note   I, Marilyn Wilkinson, connected with  PERRIS IMWALLE  ([id number], [birth year]) on 04/03/21 at 11:30 AM EST by a video-enabled telemedicine application and verified that I am speaking with the correct person using two identifiers.  Location: Patient: Virtual Visit Location Patient: Home Provider: Virtual Visit Location Provider: Home Office   I discussed the limitations of evaluation and management by  telemedicine and the availability of in person appointments. The patient expressed understanding and agreed to proceed.    History of Present Illness: Marilyn Wilkinson is a 41 y.o. who identifies as a female who was assigned female at birth, and is being seen today for symptoms starting about 3-4 days ago with headache, nasal and head congestion, cough, low-grade fever starting overnight into today. Also notes mild aches, left maxillary sinus pain. Some chest congestion and cough. Had COVID a few weeks ago with complete resolution of symptoms.   HPI: HPI  Problems:  Patient Active Problem List   Diagnosis Date Noted   Endometriosis 02/06/2021   Migraine without aura and responsive to treatment 02/06/2021   Alpha thalassemia trait 07/25/2019   Microcytosis 02/03/2019   IDA (iron deficiency anemia) 02/03/2019   Vitamin D deficiency 01/24/2019   Transient hypertension 10/14/2018   BMI 45.0-49.9, adult ([facility]) 05/25/2017   Weight gain following gastric bypass surgery 01/23/2015   PCOS (polycystic ovarian syndrome) 03/03/2011    Allergies:  Allergies  Allergen Reactions   Shellfish Allergy Hives   Medications:  Current Outpatient Medications:    amoxicillin-clavulanate (AUGMENTIN) 875-125 MG tablet, Take 1 tablet by mouth 2 (two) times daily., Disp: 14 tablet, Rfl: 0   aspirin-acetaminophen-caffeine (EXCEDRIN MIGRAINE) 250-250-65 MG tablet, Take 1 tablet by mouth every 6 (six) hours as needed for headache., Disp: ,  Rfl:    cetirizine (ZYRTEC) 10 MG tablet, Take 10 mg by mouth daily as needed for allergies., Disp: , Rfl:    fluticasone (FLONASE) 50 MCG/ACT nasal spray, Place 2 sprays into both nostrils daily., Disp: 16 g, Rfl: 0   vitamin B-12 (CYANOCOBALAMIN) 500 MCG tablet, Take 500 mcg by mouth daily., Disp: , Rfl:   Observations/Objective: Patient is well-developed, well-nourished in no acute distress.  Resting comfortably at home.  Head is normocephalic, atraumatic.  No labored  breathing. Speech is clear and coherent with logical content.  Patient is alert and oriented at baseline.  + TTP maxillary sinuses  Assessment and Plan: 1. Acute non-recurrent maxillary sinusitis - fluticasone (FLONASE) 50 MCG/ACT nasal spray; Place 2 sprays into both nostrils daily.  Dispense: 16 g; Refill: 0 - amoxicillin-clavulanate (AUGMENTIN) 875-125 MG tablet; Take 1 tablet by mouth 2 (two) times daily.  Dispense: 14 tablet; Refill: 0  Outside window for flu antiviral so do not see reason to send her for testing, especially as this has turned into more of a true sinusitis. COVID a few weeks ago so low risk of this due to acquired immunity. Supportive measures and OTC medications reviewed. Rx Augmentin. Flonase per orders.   Follow Up Instructions: I discussed the assessment and treatment plan with the patient. The patient was provided an opportunity to ask questions and all were answered. The patient agreed with the plan and demonstrated an understanding of the instructions.  A copy of instructions were sent to the patient via MyChart unless otherwise noted below.   The patient was advised to call back or seek an in-person evaluation if the symptoms worsen or if the condition fails to improve as anticipated.  Time:  I spent 10 minutes with the patient via telehealth technology discussing the above problems/concerns.    Marilyn Rio, PA-C

## 2021-04-03 NOTE — Patient Instructions (Signed)
[name], thank you for joining Piedad Climes, PA-C for today's virtual visit.  While this provider is not your primary care provider (PCP), if your PCP is located in our provider database this encounter information will be shared with them immediately following your visit.  Consent: (Patient) Marilyn Wilkinson provided verbal consent for this virtual visit at the beginning of the encounter.  Current Medications:  Current Outpatient Medications:    amoxicillin-clavulanate (AUGMENTIN) 875-125 MG tablet, Take 1 tablet by mouth 2 (two) times daily., Disp: 14 tablet, Rfl: 0   aspirin-acetaminophen-caffeine (EXCEDRIN MIGRAINE) 250-250-65 MG tablet, Take 1 tablet by mouth every 6 (six) hours as needed for headache., Disp: , Rfl:    cetirizine (ZYRTEC) 10 MG tablet, Take 10 mg by mouth daily as needed for allergies., Disp: , Rfl:    fluticasone (FLONASE) 50 MCG/ACT nasal spray, Place 2 sprays into both nostrils daily., Disp: 16 g, Rfl: 0   vitamin B-12 (CYANOCOBALAMIN) 500 MCG tablet, Take 500 mcg by mouth daily., Disp: , Rfl:    Medications ordered in this encounter:  Meds ordered this encounter  Medications   fluticasone (FLONASE) 50 MCG/ACT nasal spray    Sig: Place 2 sprays into both nostrils daily.    Dispense:  16 g    Refill:  0    Order Specific Question:   Supervising Provider    Answer:   MILLER, BRIAN [[id number]]   amoxicillin-clavulanate (AUGMENTIN) 875-125 MG tablet    Sig: Take 1 tablet by mouth 2 (two) times daily.    Dispense:  14 tablet    Refill:  0    Order Specific Question:   Supervising Provider    Answer:   Hyacinth Meeker, BRIAN [[id number]]     *If you need refills on other medications prior to your next appointment, please contact your pharmacy*  Follow-Up: Call back or seek an in-person evaluation if the symptoms worsen or if the condition fails to improve as anticipated.  Other Instructions Please take antibiotic as directed.  Increase fluid intake.  Use Saline nasal  spray.  Take a daily multivitamin. Start the Flonase nasal spray.  Place a humidifier in the bedroom.  Please call or return clinic if symptoms are not improving.  Sinusitis Sinusitis is redness, soreness, and swelling (inflammation) of the paranasal sinuses. Paranasal sinuses are air pockets within the bones of your face (beneath the eyes, the middle of the forehead, or above the eyes). In healthy paranasal sinuses, mucus is able to drain out, and air is able to circulate through them by way of your nose. However, when your paranasal sinuses are inflamed, mucus and air can become trapped. This can allow bacteria and other germs to grow and cause infection. Sinusitis can develop quickly and last only a short time (acute) or continue over a long period (chronic). Sinusitis that lasts for more than 12 weeks is considered chronic.  CAUSES  Causes of sinusitis include: Allergies. Structural abnormalities, such as displacement of the cartilage that separates your nostrils (deviated septum), which can decrease the air flow through your nose and sinuses and affect sinus drainage. Functional abnormalities, such as when the small hairs (cilia) that line your sinuses and help remove mucus do not work properly or are not present. SYMPTOMS  Symptoms of acute and chronic sinusitis are the same. The primary symptoms are pain and pressure around the affected sinuses. Other symptoms include: Upper toothache. Earache. Headache. Bad breath. Decreased sense of smell and taste. A cough, which worsens  when you are lying flat. Fatigue. Fever. Thick drainage from your nose, which often is green and may contain pus (purulent). Swelling and warmth over the affected sinuses. DIAGNOSIS  Your caregiver will perform a physical exam. During the exam, your caregiver may: Look in your nose for signs of abnormal growths in your nostrils (nasal polyps). Tap over the affected sinus to check for signs of infection. View the  inside of your sinuses (endoscopy) with a special imaging device with a light attached (endoscope), which is inserted into your sinuses. If your caregiver suspects that you have chronic sinusitis, one or more of the following tests may be recommended: Allergy tests. Nasal culture A sample of mucus is taken from your nose and sent to a lab and screened for bacteria. Nasal cytology A sample of mucus is taken from your nose and examined by your caregiver to determine if your sinusitis is related to an allergy. TREATMENT  Most cases of acute sinusitis are related to a viral infection and will resolve on their own within 10 days. Sometimes medicines are prescribed to help relieve symptoms (pain medicine, decongestants, nasal steroid sprays, or saline sprays).  However, for sinusitis related to a bacterial infection, your caregiver will prescribe antibiotic medicines. These are medicines that will help kill the bacteria causing the infection.  Rarely, sinusitis is caused by a fungal infection. In theses cases, your caregiver will prescribe antifungal medicine. For some cases of chronic sinusitis, surgery is needed. Generally, these are cases in which sinusitis recurs more than 3 times per year, despite other treatments. HOME CARE INSTRUCTIONS  Drink plenty of water. Water helps thin the mucus so your sinuses can drain more easily. Use a humidifier. Inhale steam 3 to 4 times a day (for example, sit in the bathroom with the shower running). Apply a warm, moist washcloth to your face 3 to 4 times a day, or as directed by your caregiver. Use saline nasal sprays to help moisten and clean your sinuses. Take over-the-counter or prescription medicines for pain, discomfort, or fever only as directed by your caregiver. SEEK IMMEDIATE MEDICAL CARE IF: You have increasing pain or severe headaches. You have nausea, vomiting, or drowsiness. You have swelling around your face. You have vision problems. You have a  stiff neck. You have difficulty breathing. MAKE SURE YOU:  Understand these instructions. Will watch your condition. Will get help right away if you are not doing well or get worse. Document Released: 01/27/2005 Document Revised: 04/21/2011 Document Reviewed: 02/11/2011 [facility] Patient Information 2014 [place], [place].    If you have been instructed to have an in-person evaluation today at a local Urgent Care facility, please use the link below. It will take you to a list of all of our available [facility], including address, phone number and hours of operation. Please do not delay care.  [facility]  If you or a family member do not have a primary care provider, use the link below to schedule a visit and establish care. When you choose a [facility] primary care physician or advanced practice provider, you gain a long-term partner in health. Find a Primary Care Provider  Learn more about [facility]'s in-office and virtual care options: [facility] - Get Care Now

## 2021-05-13 ENCOUNTER — Ambulatory Visit
Admission: RE | Admit: 2021-05-13 | Discharge: 2021-05-13 | Disposition: A | Discharge status: Home / Self Care | Payer: 59 | Source: Ambulatory Visit | Attending: Family | Admitting: Family

## 2021-05-13 DIAGNOSIS — Z1231 Encounter for screening mammogram for malignant neoplasm of breast: Secondary | ICD-10-CM | POA: Diagnosis not present

## 2021-05-19 DIAGNOSIS — R1084 Generalized abdominal pain: Secondary | ICD-10-CM | POA: Diagnosis not present

## 2021-05-19 DIAGNOSIS — R197 Diarrhea, unspecified: Secondary | ICD-10-CM | POA: Diagnosis not present

## 2021-05-19 DIAGNOSIS — K625 Hemorrhage of anus and rectum: Secondary | ICD-10-CM | POA: Diagnosis not present

## 2021-05-19 DIAGNOSIS — Z9884 Bariatric surgery status: Secondary | ICD-10-CM | POA: Diagnosis not present

## 2021-05-19 DIAGNOSIS — K561 Intussusception: Secondary | ICD-10-CM | POA: Diagnosis not present

## 2021-05-19 DIAGNOSIS — Z3202 Encounter for pregnancy test, result negative: Secondary | ICD-10-CM | POA: Diagnosis not present

## 2021-05-23 ENCOUNTER — Ambulatory Visit (INDEPENDENT_AMBULATORY_CARE_PROVIDER_SITE_OTHER): Payer: 59 | Admitting: Family

## 2021-05-23 ENCOUNTER — Encounter: Payer: Self-pay | Admitting: Family

## 2021-05-23 VITALS — BP 122/78 | HR 85 | Temp 97.9°F | Resp 16 | Ht 66.25 in | Wt 262.4 lb

## 2021-05-23 DIAGNOSIS — K561 Intussusception: Secondary | ICD-10-CM

## 2021-05-23 DIAGNOSIS — R197 Diarrhea, unspecified: Secondary | ICD-10-CM

## 2021-05-23 HISTORY — DX: Intussusception: K56.1

## 2021-05-23 MED ORDER — DICYCLOMINE HCL 10 MG PO CAPS: 10.0000 mg | ORAL_CAPSULE | Freq: Three times a day (TID) | ORAL | 0 refills | Status: DC

## 2021-05-23 NOTE — Progress Notes (Signed)
? ?Established Patient Office Visit ? ?Subjective:  ?Patient ID: Marilyn Wilkinson, female    DOB: 11/27/1980  Age: 41 y.o. MRN: [id number] ? ?CC:  ?Chief Complaint  ?Patient presents with  ?? Referral  ?  Wants a referral to GI doctor.  ?? Diarrhea  ?  X 4 days  ? ? ?HPI ?Marilyn Wilkinson is here today with concerns.  ? ?Diarrhea and cramping for the last six days.  ?[place], went to ER and they said likely a GI virus however still with ongoing symptoms.  ?CT scan in ER :  CT abdomen pelvis with contrast  ?Final Result by Lyndee Leohristopher David Trend, MD (04/09 1824)  ? ?Reviewed and there was noted to be a small bowel left upper quadrant intussusception not causing obstruction.  There was no bowel wall thickening or acute complication at that as well.  Gastric bypass was noted in the CT.  The right side of the colon did have a high density material although the suggestion was it was only oral contrast.  Patient was advised by radiologist and the report to monitor for bright red stool bowel which she has not seen for the last few days. ? ?Was given Bentyl which did give her some relief.  ? ?Not eating much because when she does eat it always diarrhea. Trying to follow bland BRAT diet.  ?Drinking a lot of water. Still with ongoing cramping generalized all over the abdomen. It is better from the weekend but still persistent.  ? ?No fever no chills.  ?When cramping occurs it is usually 4/10.  ?Has not had any blood in stool for the last four days but continuing to watch for this. Slight nausea, not throwing up.  ? ?Past Medical History:  ?Diagnosis Date  ?? Anemia   ? pt. told to use iron but she admits that she is not consistent in taking    ?? Fallopian tube disorder   ? 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 Headache(784.0)   ? migraines  ?? Hypertension   ? pt. reports that BP flucuates, has never had any treatment for    ?? In vitro fertilization   ?? Infertility of tubal origin   ?? Polycystic ovarian syndrome   ?? Right tubal  pregnancy without intrauterine pregnancy 06/19/2017  ? 05/29/17: single dose mtx given  ?? Sleep apnea 2004  ? has lost weight & no longer having problems in this  area   ?? Status post hysterectomy 12/29/2018  ? ? ?Past Surgical History:  ?Procedure Laterality Date  ?? CESAREAN SECTION  06/26/2011  ? Procedure: CESAREAN SECTION;  Surgeon: Catalina AntiguaPeggy Constant, MD;  Location: [facility];  Service: Gynecology;  Laterality: N/A;  ?? CYSTOSCOPY N/A 12/29/2018  ? Procedure: CYSTOSCOPY;  Surgeon: [place] BingPickens, Charlie, MD;  [facility];  Service: Gynecology;  Laterality: N/A;  ?? DIAGNOSTIC LAPAROSCOPY  07/24/2010  ? REMOVAL OF BILATERAL TUBES BLOCKAGE  ?? GASTRIC BYPASS  2004  ?? TOTAL LAPAROSCOPIC HYSTERECTOMY WITH SALPINGECTOMY Right 12/29/2018  ? Procedure: TOTAL LAPAROSCOPIC HYSTERECTOMY WITH RIGHT SALPINGECTOMY;  Surgeon: [place] BingPickens, Charlie, MD;  [facility];  Service: Gynecology;  Laterality: Right;  ? ? ?Family History  ?Problem Relation Age of Onset  ?? Asthma Mother   ?? Diabetes Mother   ?? Arthritis Mother   ?? Fibromyalgia Mother   ?? COPD Mother   ?     passed at age 41 y/o, hypoxia  ?? Hypertension Father   ?? Diabetes Father   ??  Hypertension Maternal Grandmother   ?? Arthritis Maternal Grandmother   ? ? ?Social History  ? ?Socioeconomic History  ?? Marital status: Married  ?  Spouse name: Not on file  ?? Number of children: 2  ?? Years of education: Not on file  ?? Highest education level: Not on file  ?Occupational History  ?? Occupation: home maker  ?Tobacco Use  ?? Smoking status: Never  ?? Smokeless tobacco: Never  ?Vaping Use  ?? Vaping Use: Never used  ?Substance and Sexual Activity  ?? Alcohol use: Yes  ?  Comment: SOCIALLY  ?? Drug use: No  ?? Sexual activity: Yes  ?  Birth control/protection: Surgical  ?Other Topics Concern  ?? Not on file  ?Social History Narrative  ? Two twin girls  ? ?Social Determinants of Health  ? ?Financial Resource Strain: Not on file  ?Food Insecurity: Not on file  ?Transportation  Needs: Not on file  ?Physical Activity: Not on file  ?Stress: Not on file  ?Social Connections: Not on file  ?Intimate Partner Violence: Not on file  ? ? ?Outpatient Medications Prior to Visit  ?Medication Sig Dispense Refill  ?? aspirin-acetaminophen-caffeine (EXCEDRIN MIGRAINE) 250-250-65 MG tablet Take 1 tablet by mouth every 6 (six) hours as needed for headache.    ?? calcium-vitamin D (OSCAL WITH D) 500-5 MG-MCG tablet Take 2 tablets by mouth.    ?? cetirizine (ZYRTEC) 10 MG tablet Take 10 mg by mouth daily as needed for allergies.    ?? vitamin B-12 (CYANOCOBALAMIN) 500 MCG tablet Take 500 mcg by mouth daily.    ?? dicyclomine (BENTYL) 20 MG tablet Take by mouth.    ?? amoxicillin-clavulanate (AUGMENTIN) 875-125 MG tablet Take 1 tablet by mouth 2 (two) times daily. (Patient not taking: Reported on 05/23/2021) 14 tablet 0  ?? fluticasone (FLONASE) 50 MCG/ACT nasal spray Place 2 sprays into both nostrils daily. (Patient not taking: Reported on 05/23/2021) 16 g 0  ? ?No facility-administered medications prior to visit.  ? ? ?Allergies  ?Allergen Reactions  ?? Shellfish Allergy Hives  ? ? ?ROS ?Review of Systems  ?Constitutional:  Positive for appetite change (decreased scared to eat for fear of symptoms). Negative for chills, fatigue, fever and unexpected weight change.  ?Eyes:  Negative for visual disturbance.  ?Respiratory:  Negative for shortness of breath.   ?Cardiovascular:  Negative for chest pain.  ?Gastrointestinal:  Positive for abdominal pain (generalized with cramping), diarrhea (ongoing) and nausea (at times). Negative for abdominal distention, anal bleeding, blood in stool, constipation, rectal pain and vomiting.  ?Genitourinary:  Negative for difficulty urinating.  ?Skin:  Negative for rash.  ?Neurological:  Negative for dizziness and headaches.  ? ?  ?Objective:  ?  ?Physical Exam ?Constitutional:   ?   General: She is not in acute distress. ?   Appearance: She is obese. She is not ill-appearing,  toxic-appearing or diaphoretic.  ?Cardiovascular:  ?   Pulses: Normal pulses.  ?   Heart sounds: Normal heart sounds.  ?Pulmonary:  ?   Effort: Pulmonary effort is normal.  ?   Breath sounds: Normal breath sounds.  ?Abdominal:  ?   General: Abdomen is flat. Bowel sounds are decreased. There is no distension.  ?   Palpations: Abdomen is soft. There is no shifting dullness, fluid wave or mass.  ?   Tenderness: There is abdominal tenderness in the epigastric area and periumbilical area. There is no right CVA tenderness, left CVA tenderness or guarding. Negative signs include Murphy's  sign and McBurney's sign.  ?   Hernia: No hernia is present.  ?Neurological:  ?   Mental Status: She is alert.  ? ? ?BP 122/78   Pulse 85   Temp 97.9 ?F (36.6 ?C)   Resp 16   Ht 5' 6.25" (1.683 m)   Wt 262 lb 6 oz (119 kg)   LMP 12/13/2018   SpO2 98%   BMI 42.03 kg/m?  ?Wt Readings from Last 3 Encounters:  ?05/23/21 262 lb 6 oz (119 kg)  ?02/06/21 270 lb (122.5 kg)  ?11/22/20 292 lb (132.5 kg)  ? ? ? ?Health Maintenance Due  ?Topic Date Due  ?? COVID-19 Vaccine (3 - Booster for Pfizer series) 10/01/2019  ? ? ?There are no preventive care reminders to display for this patient. ? ?Lab Results  ?Component Value Date  ? TSH 1.700 07/25/2019  ? ?Lab Results  ?Component Value Date  ? WBC 6.3 11/22/2020  ? HGB 12.6 11/22/2020  ? HCT 40.1 11/22/2020  ? MCV 73.8 (L) 11/22/2020  ? PLT 231.0 11/22/2020  ? ?Lab Results  ?Component Value Date  ? NA 138 11/22/2020  ? K 4.1 11/22/2020  ? CO2 24 11/22/2020  ? GLUCOSE 81 11/22/2020  ? BUN 14 11/22/2020  ? CREATININE 0.71 11/22/2020  ? BILITOT 0.6 11/22/2020  ? ALKPHOS 55 11/22/2020  ? AST 16 11/22/2020  ? ALT 12 11/22/2020  ? PROT 6.8 11/22/2020  ? ALBUMIN 4.1 11/22/2020  ? CALCIUM 9.3 11/22/2020  ? ANIONGAP 8 04/12/2019  ? GFR 106.78 11/22/2020  ? ?Lab Results  ?Component Value Date  ? HGBA1C 5.6 11/22/2020  ? ? ?  ?Assessment & Plan:  ? ?Problem List Items Addressed This Visit   ? ?  ? Digestive   ? Small bowel intussusception (HCC) - Primary  ?  Discussed red flag symptoms with patient and advised her to go to the emergency room if any worsening abdominal pain nausea vomiting and/or fever. ?Urgent referral

## 2021-05-23 NOTE — Assessment & Plan Note (Signed)
Continue to hydrate with water as tolerated ?Continue with brat diet ?Holding off of medications at this time due to intussception ?

## 2021-05-23 NOTE — Assessment & Plan Note (Addendum)
Discussed red flag symptoms with patient and advised her to go to the emergency room if any worsening abdominal pain nausea vomiting and/or fever. ?Urgent referral to GI in place. ?Continue with bland diet only as tolerated. ?Refill for bentyl 10 mg ? ?Reviewed CT scan as well as ER report from care everywhere from date 4/9. Visit to go over acute concerns, PE, reviewing notes and placing orders totaled 35 minutes.  ?

## 2021-05-24 ENCOUNTER — Encounter: Payer: Self-pay | Admitting: Oncology

## 2021-05-24 ENCOUNTER — Telehealth: Payer: Self-pay | Admitting: Family

## 2021-05-24 NOTE — Telephone Encounter (Signed)
Pt husband called stating that the GI referral in [place] can't see pt until August. Pt husband is asking is there a GI in [place] that could take pt earlier. Please advise. ?

## 2021-05-27 ENCOUNTER — Telehealth: Payer: Self-pay | Admitting: Family

## 2021-05-27 NOTE — Telephone Encounter (Signed)
Pt called about a referral, she is asking to see if she can change the location to [facility], in [place]. It is closer for her.  ? ?Call # for [facility]: [phone] ?

## 2021-05-28 ENCOUNTER — Encounter: Payer: Self-pay | Admitting: Nurse Practitioner

## 2021-05-28 NOTE — Telephone Encounter (Signed)
Pt called stating that the referral that was given to her in [place] can't take her until August.  Pt states that she is in pain and she really needs to get in sooner. Pt is asking if she can get another referral that could get her in sooner. Please advise. ?

## 2021-05-29 NOTE — Telephone Encounter (Signed)
Pt called checking on status of referral. Pt is asking for a call when this is completed. Please advise. ?

## 2021-05-29 NOTE — Telephone Encounter (Signed)
Pt husband called stating that they need the referral to [facility] in [place]. Pt husband states that the appointment has already been made for 06/13/21 but they cant do anything until they get that referral. Pt husband stated that they have been calling since last Friday and that this is very urgent. Pt husband states that they would like a call when this is completed. Please advise. ?

## 2021-05-30 ENCOUNTER — Other Ambulatory Visit: Payer: Self-pay | Admitting: Family

## 2021-05-30 DIAGNOSIS — K561 Intussusception: Secondary | ICD-10-CM

## 2021-05-30 NOTE — Telephone Encounter (Signed)
Called pt but vm is full unable to leave a message.  

## 2021-05-30 NOTE — Telephone Encounter (Signed)
Referral sent to [facility] - they are with [facility] so the referral goes straight to their [facility] ? ? ?

## 2021-06-05 ENCOUNTER — Encounter: Payer: Self-pay | Admitting: Family

## 2021-06-11 NOTE — Telephone Encounter (Signed)
Pt worked in with [facility] 06/13/21 with Willette Cluster, NP ?

## 2021-06-13 ENCOUNTER — Other Ambulatory Visit (INDEPENDENT_AMBULATORY_CARE_PROVIDER_SITE_OTHER): Payer: 59

## 2021-06-13 ENCOUNTER — Encounter: Payer: Self-pay | Admitting: Nurse Practitioner

## 2021-06-13 ENCOUNTER — Other Ambulatory Visit: Payer: Self-pay

## 2021-06-13 ENCOUNTER — Ambulatory Visit: Payer: 59 | Admitting: Nurse Practitioner

## 2021-06-13 VITALS — BP 128/70 | HR 98 | Ht 65.5 in | Wt 262.5 lb

## 2021-06-13 DIAGNOSIS — R9389 Abnormal findings on diagnostic imaging of other specified body structures: Secondary | ICD-10-CM

## 2021-06-13 DIAGNOSIS — D509 Iron deficiency anemia, unspecified: Secondary | ICD-10-CM | POA: Diagnosis not present

## 2021-06-13 DIAGNOSIS — K6289 Other specified diseases of anus and rectum: Secondary | ICD-10-CM

## 2021-06-13 LAB — IBC + FERRITIN
Ferritin: 11.5 ng/mL (ref 10.0–291.0)
Iron: 85 ug/dL (ref 42–145)
Saturation Ratios: 25.1 % (ref 20.0–50.0)
TIBC: 338.8 ug/dL (ref 250.0–450.0)
Transferrin: 242 mg/dL (ref 212.0–360.0)

## 2021-06-13 MED ORDER — AMBULATORY NON FORMULARY MEDICATION: 0 refills | Status: DC

## 2021-06-13 NOTE — Patient Instructions (Signed)
Your provider has prescribed Diltiazem  gel for you. Please follow the directions written on your prescription bottle or given to you specifically by your provider. Since this is a specialty medication and is not readily available at most local pharmacies, we have sent your prescription to: ? ?[facility]'s information is below: ?Address: [address] [place], [place] [address]  ?[phone] ? ?*Please DO NOT go directly from our office to pick up this medication! Give the pharmacy 1 day to process the prescription as this is compounded and takes time to make.  ? ? ?Your provider has requested that you go to the basement level for lab work before leaving today. Press "B" on the elevator. The lab is located at the first door on the left as you exit the elevator.  ? ?\Due to recent changes in healthcare laws, you may see the results of your imaging and laboratory studies on MyChart before your provider has had a chance to review them.  We understand that in some cases there may be results that are confusing or concerning to you. Not all laboratory results come back in the same time frame and the provider may be waiting for multiple results in order to interpret others.  Please give [place] 48 hours in order for your provider to thoroughly review all the results before contacting the office for clarification of your results.   ? ?If you are age 42 or older, your body mass index should be between 23-30. Your Body mass index is 43.02 kg/m?Marland Kitchen If this is out of the aforementioned range listed, please consider follow up with your Primary Care Provider. ? ?If you are age 41 or younger, your body mass index should be between 19-25. Your Body mass index is 43.02 kg/m?Marland Kitchen If this is out of the aformentioned range listed, please consider follow up with your Primary Care Provider.  ? ?________________________________________________________ ? ?The [facility] providers would like to encourage you to use [facility] to  communicate with providers for non-urgent requests or questions.  Due to long hold times on the telephone, sending your provider a message by [facility] may be a faster and more efficient way to get a response.  Please allow 48 business hours for a response.  Please remember that this is for non-urgent requests.  ?_______________________________________________________  ? ?Thank you for choosing [facility] ? ?Marilyn Wilkinson  ?

## 2021-06-13 NOTE — Progress Notes (Signed)
? ? ?Assessment  ? ?Patient profile:  ?Marilyn Wilkinson is a 41 y.o. female  new to practice, referred by PCP for abdominal pain and small bowel intusseption. Her past medical history is significant for hysterectomy, C-section, bariatric surgery in 2004. See PMH below for any additional history ? ?Acute diarrhea with minor rectal bleeding ( probably anorectal)  / generalized abdominal pain.  ?Symptoms started acutely while visiting family in early April in [place]. Seen in ED.  WBC 13, labs o/w okay. Probably infectious process.  Symptoms have resolved ? ?Rectal discomfort with bowel movement following above diarrheal illness. Possibly anal fissure.  ?Due to discomfort only a digital rectal exam was done today ( no anoscopy). Significant posterior tenderness ? ?Small bowel intussusception on CT scan 05/19/21.  ?Appeared to be at site of small bowel anastomosis.  Probably incidental finding. No vomiting or evidence for intestinal obstruction.  ? ?Iron deficiency without anemia. Ferritin 10 in October ?Probably related to gastric bypass / malabsorption.  ? ?Plan  ? ?Repeat iron studies.  ?Will ask Dr. Terri Piedra to reivew CT scan from [place] in [facility]. May need upper endoscopy to evaluate small bowel bowel findings on CT scan ?Treat empirically for anal fissure.  ? ?History of Present Illness  ? ?Chief complaint: abnormal CT scan. Rectal discomfort ? ?Marilyn Wilkinson went to ED in [place] early April. She was visiting family there and developed diarrhea with some rectal bleeding and generalized abdominal pain.  WBC 13, hgb 13.2, MCV 78. Lipase and LFTs okay. CT scan showed small bowel anastomosis. Also there was question of blood in right colon but the bleeding seems like it was more from an anorectal source.  Upon returning home patient saw her PCP who referred her to [place] ? ?CT scan >> small bowel-small bowel left upper quadrant intussusception which appears to be at the site of the small bowel anastomosis.  It  does not cause obstruction.  There is no bowel wall thickening or other acute complication appreciated at this time. Gastric bypass noted.  ?High density material in the right side of the colon is probably just oral contrast although it seems to have skipped a long segment of the small bowel.  If the patient has excessive bright red blood in the stool, the possibility of hemorrhage into the  right side of the colon could not be excluded (but seems less likely based on the apparent density of the material).  ? ? ?Interval History:  ?Diarrhea lasted 12 days, has now resolved. She hasn't had any more rectal bleeding but has developed discomfort in rectum during bowel movements. This discomfort just started within the last couple of weeks.  She otherwise feels okay. She lost 7-8 pounds during the acute illness but slowly regaining. Prior to developing this acute GI illness she was feeling well. No chronic GI problems other than occasional heartburn with certain acidic fruits.  ? ?No FMH of any GI diseases or colon cancer.   ? ? ?Previous Labs / Imaging:: ? ?  Latest Ref Rng & Units 11/22/2020  ?  8:38 AM 04/12/2019  ? 10:58 AM 01/24/2019  ?  3:14 PM  ?CBC  ?WBC 4.0 - 10.5 K/uL 6.3   7.6   7.6    ?Hemoglobin 12.0 - 15.0 g/dL 84.5   36.4   8.6    ?Hematocrit 36.0 - 46.0 % 40.1   45.5   30.7    ?Platelets 150.0 - 400.0 K/uL 231.0   251   359    ? ? ?  No results found for: LIPASE ? ?  Latest Ref Rng & Units 11/22/2020  ?  8:38 AM 04/12/2019  ? 10:58 AM 12/29/2018  ? 12:00 PM  ?CMP  ?Glucose 70 - 99 mg/dL 81   88   75    ?BUN 6 - 23 mg/dL 14   13   11     ?Creatinine 0.40 - 1.20 mg/dL 1.610.71   0.960.79   0.450.61    ?Sodium 135 - 145 mEq/L 138   138   138    ?Potassium 3.5 - 5.1 mEq/L 4.1   4.0   3.8    ?Chloride 96 - 112 mEq/L 106   106   109    ?CO2 19 - 32 mEq/L 24   24   22     ?Calcium 8.4 - 10.5 mg/dL 9.3   9.0   8.7    ?Total Protein 6.0 - 8.3 g/dL 6.8      ?Total Bilirubin 0.2 - 1.2 mg/dL 0.6      ?Alkaline Phos 39 - 117 U/L 55       ?AST 0 - 37 U/L 16      ?ALT 0 - 35 U/L 12      ? ? ?Previous GI Evaluations  ? ?Endoscopies: ?none ? ? ?Imaging:  ?CTAP w/ contrast 05/19/21 in [facility] Every[facility] ?IMPRESSION:  ? ?There is a small bowel-small bowel left upper quadrant intussusception which appears to be at the site of the small bowel anastomosis.  It does not cause obstruction.  There is no bowel wall thickening or other acute complication appreciated at this  ?time.  ? ?Gastric bypass noted.  ? ?High density material in the right side of the colon is probably just oral contrast although it seems to have skipped a long segment of the small bowel.  If the patient has excessive bright red blood in the stool, the possibility of hemorrhage into the  right side of the colon could not be excluded (but seems less likely based on the apparent density of the material).  ? ? ?Past Medical History:  ?Diagnosis Date  ? Anemia   ? pt. told to use iron but she admits that she is not consistent in taking    ? Anxiety   ? Depression   ? Fallopian tube disorder   ? BLOCKAGE ON BILATERAL TUBE  ? Headache(784.0)   ? migraines  ? Hypertension   ? pt. reports that BP flucuates, has never had any treatment for    ? In vitro fertilization   ? Infertility of tubal origin   ? Polycystic ovarian syndrome   ? Right tubal pregnancy without intrauterine pregnancy 06/19/2017  ? 05/29/17: single dose mtx given  ? Sleep apnea 2004  ? has lost weight & no longer having problems in this  area   ? Status post hysterectomy 12/29/2018  ? ?Past Surgical History:  ?Procedure Laterality Date  ? CESAREAN SECTION  06/26/2011  ? Procedure: CESAREAN SECTION;  Surgeon: Catalina AntiguaPeggy Constant, MD;  Location: [facility];  Service: Gynecology;  Laterality: N/A;  ? CYSTOSCOPY N/A 12/29/2018  ? Procedure: CYSTOSCOPY;  Surgeon: [place] BingPickens, Charlie, MD;  [facility];  Service: Gynecology;  Laterality: N/A;  ? DIAGNOSTIC LAPAROSCOPY  07/24/2010  ? REMOVAL OF BILATERAL TUBES BLOCKAGE  ? GASTRIC BYPASS  2004  ? TOTAL  LAPAROSCOPIC HYSTERECTOMY WITH SALPINGECTOMY Right 12/29/2018  ? Procedure: TOTAL LAPAROSCOPIC HYSTERECTOMY WITH RIGHT SALPINGECTOMY;  Surgeon:  BingPickens, Charlie, MD;  Location: [facility] OR;  Service:  Gynecology;  Laterality: Right;  ? ?Family History  ?Problem Relation Age of Onset  ? Asthma Mother   ? Diabetes Mother   ? Arthritis Mother   ? Fibromyalgia Mother   ? COPD Mother   ?     passed at age 12 y/o, hypoxia  ? Heart disease Mother   ? Hypertension Father   ? Diabetes Father   ? Kidney disease Father   ? Hypertension Maternal Grandmother   ? Arthritis Maternal Grandmother   ? Stroke Maternal Grandmother   ? Heart disease Maternal Grandfather   ? Heart defect Daughter   ? ?Social History  ? ?Tobacco Use  ? Smoking status: Never  ? Smokeless tobacco: Never  ?Vaping Use  ? Vaping Use: Never used  ?Substance Use Topics  ? Alcohol use: Yes  ?  Comment: Social wine  ? Drug use: No  ? ?Current Outpatient Medications  ?Medication Sig Dispense Refill  ? aspirin-acetaminophen-caffeine (EXCEDRIN MIGRAINE) 250-250-65 MG tablet Take 1 tablet by mouth every 6 (six) hours as needed for headache.    ? cetirizine (ZYRTEC) 10 MG tablet Take 10 mg by mouth daily as needed for allergies.    ? vitamin B-12 (CYANOCOBALAMIN) 500 MCG tablet Take 500 mcg by mouth daily.    ? dicyclomine (BENTYL) 10 MG capsule Take 1 capsule (10 mg total) by mouth 4 (four) times daily -  before meals and at bedtime for 10 days. (Patient not taking: Reported on 06/13/2021) 40 capsule 0  ? ?No current facility-administered medications for this visit.  ? ?Allergies  ?Allergen Reactions  ? Shellfish Allergy Hives  ?  JUST SHRIMP  ? ? ? ?Review of Systems: ?All other systems reviewed and negative except where noted in HPI.  ? ?Physical Exam  ? ?Wt Readings from Last 3 Encounters:  ?06/13/21 262 lb 8 oz (119.1 kg)  ?05/23/21 262 lb 6 oz (119 kg)  ?02/06/21 270 lb (122.5 kg)  ? ? ?BP 128/70 (BP Location: Left Arm, Patient Position: Sitting, Cuff Size: Large)    Pulse 98   Ht 5' 5.5" (1.664 m) Comment: height measured without shoes  Wt 262 lb 8 oz (119.1 kg)   LMP 12/13/2018   BMI 43.02 kg/m?  ?Constitutional:  Generally well appearing female in no acute distress. ?

## 2021-06-14 NOTE — Progress Notes (Signed)
Addendum: ?Reviewed and agree with assessment and management plan. ?Iron studies reviewed, and fortunately the patient is not iron deficient. ?The intussusception seen by CT scan recently while she was traveling was likely at the jejunojejunal anastomosis related to her gastric bypass.  Given the lack of signs of obstruction, this was likely related to an infectious process which is now resolved. ?Further evaluation including endoscopic evaluation and repeat imaging can be performed if symptoms recur. ?Lerone Onder, Carie Caddy, MD ? ?

## 2021-06-18 ENCOUNTER — Telehealth: Payer: Self-pay

## 2021-06-18 NOTE — Telephone Encounter (Signed)
-----   Message from Meredith Pel, NP sent at 06/18/2021 12:26 PM EDT ----- ?Vernona Rieger,  ?Please let her know that her iron is low normal but she is not clearly iron deficient so this is good news.  Dr. Rhea Belton looked at her CT scan.  Since she was not having obstructive symptoms at the time  CT scan was done and she is feeling better now then there is no need for repeat imaging or endoscopic evaluation. Should she have recurrent symptoms and / or developed nausea and vomiting then she should call us back right away.  ? ?

## 2021-06-18 NOTE — Telephone Encounter (Signed)
Spoke with pt and gave pt results and recommendations. Pt verbalized understanding and had no other concerns at end of call.  

## 2021-07-18 ENCOUNTER — Encounter: Payer: Self-pay | Admitting: Obstetrics and Gynecology

## 2021-07-18 ENCOUNTER — Ambulatory Visit (INDEPENDENT_AMBULATORY_CARE_PROVIDER_SITE_OTHER): Payer: 59 | Admitting: Obstetrics and Gynecology

## 2021-07-18 ENCOUNTER — Other Ambulatory Visit (HOSPITAL_COMMUNITY)
Admission: RE | Admit: 2021-07-18 | Discharge: 2021-07-18 | Disposition: A | Discharge status: Home / Self Care | Payer: 59 | Source: Ambulatory Visit | Attending: Obstetrics and Gynecology | Admitting: Obstetrics and Gynecology

## 2021-07-18 VITALS — BP 118/87 | HR 102 | Ht 66.0 in | Wt 265.0 lb

## 2021-07-18 DIAGNOSIS — E282 Polycystic ovarian syndrome: Secondary | ICD-10-CM | POA: Diagnosis not present

## 2021-07-18 DIAGNOSIS — R61 Generalized hyperhidrosis: Secondary | ICD-10-CM | POA: Diagnosis not present

## 2021-07-18 DIAGNOSIS — R232 Flushing: Secondary | ICD-10-CM

## 2021-07-18 DIAGNOSIS — N898 Other specified noninflammatory disorders of vagina: Secondary | ICD-10-CM | POA: Insufficient documentation

## 2021-07-18 DIAGNOSIS — Z01419 Encounter for gynecological examination (general) (routine) without abnormal findings: Secondary | ICD-10-CM

## 2021-07-18 DIAGNOSIS — B9689 Other specified bacterial agents as the cause of diseases classified elsewhere: Secondary | ICD-10-CM | POA: Diagnosis not present

## 2021-07-18 DIAGNOSIS — N760 Acute vaginitis: Secondary | ICD-10-CM | POA: Diagnosis not present

## 2021-07-18 MED ORDER — METFORMIN HCL 500 MG PO TABS: 500.0000 mg | ORAL_TABLET | Freq: Two times a day (BID) | ORAL | 5 refills | Status: DC

## 2021-07-18 NOTE — Progress Notes (Signed)
Obstetrics and Gynecology Annual Patient Evaluation  Appointment Date: 07/18/2021  OBGYN Clinic: [facility]  Primary Care Provider: Mort Sawyersugal, Tabitha  Referring Provider: Mort Sawyersugal, Tabitha, FNP  Chief Complaint:  Chief Complaint  Patient presents with   Gynecologic Exam    History of Present Illness: Marilyn HoughFiona F Wilkinson is a 41 y.o. [phone] (Patient's last menstrual period was 12/12/2018.), seen for the above chief complaint. Her past medical history is significant for h/o TLH/BS   +hot flashes and night sweats and some vaginal dryness. Pt would like levels checked to see about possible menopause.  She has had great weight loss but has reached a plateau and would like to do metformin again, which she used in the past with IVF, but helped with weight loss  No vaginal bleeding or spotting.   Review of Systems: Pertinent items noted in HPI and remainder of comprehensive ROS otherwise negative.   Patient Active Problem List   Diagnosis Date Noted   Diarrhea 05/23/2021   Small bowel intussusception (HCC) 05/23/2021   Endometriosis 02/06/2021   Migraine without aura and responsive to treatment 02/06/2021   Alpha thalassemia trait 07/25/2019   Microcytosis 02/03/2019   IDA (iron deficiency anemia) 02/03/2019   Vitamin D deficiency 01/24/2019   Transient hypertension 10/14/2018   BMI 45.0-49.9, adult (HCC) 05/25/2017   Weight gain following gastric bypass surgery 01/23/2015   PCOS (polycystic ovarian syndrome) 03/03/2011     Past Medical History:  Past Medical History:  Diagnosis Date   Anemia    pt. told to use iron but she admits that she is not consistent in taking     Anxiety    Depression    Fallopian tube disorder    BLOCKAGE ON BILATERAL TUBE   Headache(784.0)    migraines   Hypertension    pt. reports that BP flucuates, has never had any treatment for     In vitro fertilization    Infertility of tubal origin    Polycystic ovarian syndrome     Right tubal pregnancy without intrauterine pregnancy 06/19/2017   05/29/17: single dose mtx given   Sleep apnea 2004   has lost weight & no longer having problems in this  area    Status post hysterectomy 12/29/2018    Past Surgical History:  Past Surgical History:  Procedure Laterality Date   CESAREAN SECTION  06/26/2011   Procedure: CESAREAN SECTION;  Surgeon: Catalina AntiguaPeggy Constant, MD;  Location: [facility];  Service: Gynecology;  Laterality: N/A;   CYSTOSCOPY N/A 12/29/2018   Procedure: CYSTOSCOPY;  Surgeon: [place] BingPickens, Jerrid Forgette, MD;  Location: [facility] OR;  Service: Gynecology;  Laterality: N/A;   DIAGNOSTIC LAPAROSCOPY  07/24/2010   REMOVAL OF BILATERAL TUBES BLOCKAGE   GASTRIC BYPASS  2004   TOTAL LAPAROSCOPIC HYSTERECTOMY WITH SALPINGECTOMY Right 12/29/2018   Procedure: TOTAL LAPAROSCOPIC HYSTERECTOMY WITH RIGHT SALPINGECTOMY;  Surgeon: [place] BingPickens, Nathanyl Andujo, MD;  [facility];  Service: Gynecology;  Laterality: Right;    Past Obstetrical History:  OB History  Gravida Para Term Preterm AB Living  2 1 0 1 1 2   SAB IAB Ectopic Multiple Live Births  0 0 1 1 2     # Outcome Date GA Lbr Len/2nd Weight Sex Delivery Anes PTL Lv  2 Ectopic 06/2017          1A Preterm 06/26/11 [id number]  1 lb 15 oz (0.88 kg) F CS-LTranv Spinal  LIV  1B Preterm 06/26/11 [id number]  1 lb 6.2 oz (0.63 kg) F CS-LTranv Spinal  LIV    Obstetric Comments  06/2017: rt tubal ectopic. MTx used    Past Gynecological History: As per HPI.  Social History:  Social History   Socioeconomic History   Marital status: Married    Spouse name: Not on file   Number of children: 2   Years of education: Not on file   Highest education level: Not on file  Occupational History   Occupation: home maker  Tobacco Use   Smoking status: Never   Smokeless tobacco: Never  Vaping Use   Vaping Use: Never used  Substance and Sexual Activity   Alcohol use: Yes    Comment: Social wine   Drug use: No   Sexual activity: Yes    Partners: Male     Birth control/protection: Surgical    Comment: Married  Other Topics Concern   Not on file  Social History Narrative   Two twin girls   Social Determinants of Health   Financial Resource Strain: Not on file  Food Insecurity: Not on file  Transportation Needs: Not on file  Physical Activity: Not on file  Stress: Not on file  Social Connections: Not on file  Intimate Partner Violence: Not on file    Family History:  Family History  Problem Relation Age of Onset   Asthma Mother    Diabetes Mother    Arthritis Mother    Fibromyalgia Mother    COPD Mother        passed at age 70 y/o, hypoxia   Heart disease Mother    Hypertension Father    Diabetes Father    Kidney disease Father    Hypertension Maternal Grandmother    Arthritis Maternal Grandmother    Stroke Maternal Grandmother    Heart disease Maternal Grandfather    Heart defect Daughter     Medications Marilyn Wilkinson had no medications administered during this visit. Current Outpatient Medications  Medication Sig Dispense Refill   aspirin-acetaminophen-caffeine (EXCEDRIN MIGRAINE) 250-250-65 MG tablet Take 1 tablet by mouth every 6 (six) hours as needed for headache.     cetirizine (ZYRTEC) 10 MG tablet Take 10 mg by mouth daily as needed for allergies.     vitamin B-12 (CYANOCOBALAMIN) 500 MCG tablet Take 500 mcg by mouth daily.     AMBULATORY NON FORMULARY MEDICATION Medication Name: Diltiazem Gel 2% apply pea sized amount per rectum three times a day for 6 weeks (Patient not taking: Reported on 07/18/2021) 30 g 0   dicyclomine (BENTYL) 10 MG capsule Take 1 capsule (10 mg total) by mouth 4 (four) times daily -  before meals and at bedtime for 10 days. (Patient not taking: Reported on 06/13/2021) 40 capsule 0   No current facility-administered medications for this visit.    Allergies Shellfish allergy   Physical Exam:  BP 118/87   Pulse (!) 102   Ht 5\' 6"  (1.676 m)   Wt 265 lb (120.2 kg)   LMP 12/12/2018    BMI 42.77 kg/m  Body mass index is 42.77 kg/m.  General appearance: Well nourished, well developed female in no acute distress.  Neck:  Supple, normal appearance, and no thyromegaly  Cardiovascular: normal s1 and s2.  No murmurs, rubs or gallops. Respiratory:  Clear to auscultation bilateral. Normal respiratory effort Abdomen: positive bowel sounds and no masses, hernias; diffusely non tender to palpation, non distended Breasts: breasts appear normal, no suspicious masses, no skin or nipple changes or axillary nodes, and normal palpation. Neuro/Psych:  Normal mood  and affect.  Skin:  Warm and dry.  Lymphatic:  No inguinal lymphadenopathy.   Pelvic exam: is not limited by body habitus EGBUS: within normal limits Vagina: within normal limits and with no blood or discharge in the vault Cuff: well healed, normal  Laboratory: none  Radiology: none  Assessment: pt stable  Plan: 1. Vaginal dryness - Cervicovaginal ancillary only( [place])  2. Hot flashes - Follicle stimulating hormone - Estradiol - TSH Rfx on Abnormal to Free T4  3. Night sweats - Follicle stimulating hormone - Estradiol - TSH Rfx on Abnormal to Free T4  4. PCOS (polycystic ovarian syndrome) Metformin 500 bid given. Normal cmp and lactic acid earlier this year with pcp. Pt told okay to increase to 1000 bid prn.   5. Well Woman  RTC 1 year  [place] Bing, Montez Hageman MD Attending [facility] [employer] [facility])

## 2021-07-18 NOTE — Progress Notes (Signed)
Patient presents for Annual Exam today.  FXJ:[id number]  Last pap:10/12/2015 Mammogram:05/14/2021 WNL  Family Hx of Breast Cancer: None  STD Screening : Declined   Pt wants to discuss labs for Menopause. Pt having hot flashes, night sweats , mood swings, tender in both breast.

## 2021-07-19 LAB — CERVICOVAGINAL ANCILLARY ONLY
Bacterial Vaginitis (gardnerella): POSITIVE — AB
Candida Glabrata: NEGATIVE
Candida Vaginitis: NEGATIVE
Comment: NEGATIVE
Comment: NEGATIVE
Comment: NEGATIVE

## 2021-07-19 LAB — TSH RFX ON ABNORMAL TO FREE T4: TSH: 1.45 u[IU]/mL (ref 0.450–4.500)

## 2021-07-19 LAB — FOLLICLE STIMULATING HORMONE: FSH: 7 m[IU]/mL

## 2021-07-19 LAB — ESTRADIOL: Estradiol: 64.2 pg/mL

## 2021-07-20 MED ORDER — METRONIDAZOLE 500 MG PO TABS: 500.0000 mg | ORAL_TABLET | Freq: Two times a day (BID) | ORAL | 0 refills | Status: AC

## 2021-07-20 NOTE — Addendum Note (Signed)
Addended by: [place] Bing on: 07/20/2021 09:46 AM   Modules accepted: Orders

## 2021-08-22 ENCOUNTER — Ambulatory Visit (INDEPENDENT_AMBULATORY_CARE_PROVIDER_SITE_OTHER): Payer: 59 | Admitting: Family

## 2021-08-22 ENCOUNTER — Encounter: Payer: Self-pay | Admitting: Family

## 2021-08-22 ENCOUNTER — Other Ambulatory Visit: Payer: Self-pay | Admitting: Family

## 2021-08-22 VITALS — BP 122/82 | HR 85 | Temp 98.3°F | Resp 16 | Ht 66.0 in | Wt 269.1 lb

## 2021-08-22 DIAGNOSIS — Z6841 Body Mass Index (BMI) 40.0 and over, adult: Secondary | ICD-10-CM

## 2021-08-22 DIAGNOSIS — N3000 Acute cystitis without hematuria: Secondary | ICD-10-CM | POA: Insufficient documentation

## 2021-08-22 DIAGNOSIS — I4510 Unspecified right bundle-branch block: Secondary | ICD-10-CM | POA: Diagnosis not present

## 2021-08-22 DIAGNOSIS — E282 Polycystic ovarian syndrome: Secondary | ICD-10-CM | POA: Diagnosis not present

## 2021-08-22 DIAGNOSIS — R030 Elevated blood-pressure reading, without diagnosis of hypertension: Secondary | ICD-10-CM | POA: Diagnosis not present

## 2021-08-22 DIAGNOSIS — R350 Frequency of micturition: Secondary | ICD-10-CM | POA: Diagnosis not present

## 2021-08-22 LAB — POC URINALSYSI DIPSTICK (AUTOMATED)
Bilirubin, UA: NEGATIVE
Blood, UA: NEGATIVE
Glucose, UA: POSITIVE — AB
Leukocytes, UA: NEGATIVE
Nitrite, UA: NEGATIVE
Protein, UA: POSITIVE — AB
Spec Grav, UA: >=1.03 — AB (ref 1.010–1.025)
Urobilinogen, UA: 0.2 E.U./dL
pH, UA: 5.5 (ref 5.0–8.0)

## 2021-08-22 LAB — POCT GLYCOSYLATED HEMOGLOBIN (HGB A1C): Hemoglobin A1C: 5.2 % (ref 4.0–5.6)

## 2021-08-22 MED ORDER — NITROFURANTOIN MONOHYD MACRO 100 MG PO CAPS: 100.0000 mg | ORAL_CAPSULE | Freq: Two times a day (BID) | ORAL | 0 refills | Status: AC

## 2021-08-22 MED ORDER — WEGOVY 0.25 MG/0.5ML ~~LOC~~ SOAJ: 0.2500 mg | SUBCUTANEOUS | 2 refills | Status: DC

## 2021-08-22 NOTE — Progress Notes (Signed)
Established Patient Office Visit  Subjective:  Patient ID: Marilyn Wilkinson, female    DOB: 1980/06/13  Age: 41 y.o. MRN: [id number]  CC:  Chief Complaint  Patient presents with   Urinary Tract Infection    X 4 days Been taking AZO    HPI Marilyn Wilkinson is here today with concerns.   Four days ago with dysuria, pelvic pressure, and some urinary frequency. No fever chills or back pain.   Pt has been taking azo.   Concerned with weight, has been working on diet and exercise , cut out sweets completely. Walks 45 minutes on treadmill three times a week with no improvement. TSH wnl.   Past Medical History:  Diagnosis Date   Anemia    pt. told to use iron but she admits that she is not consistent in taking     Anxiety    Depression    Fallopian tube disorder    BLOCKAGE ON BILATERAL TUBE   Headache(784.0)    migraines   Hypertension    pt. reports that BP flucuates, has never had any treatment for     In vitro fertilization    Infertility of tubal origin    Polycystic ovarian syndrome    Right tubal pregnancy without intrauterine pregnancy 06/19/2017   05/29/17: single dose mtx given   Sleep apnea 2004   has lost weight & no longer having problems in this  area    Small bowel intussusception (HCC) 05/23/2021   Status post hysterectomy 12/29/2018   Transient hypertension 10/14/2018    Past Surgical History:  Procedure Laterality Date   CESAREAN SECTION  06/26/2011   Procedure: CESAREAN SECTION;  Surgeon: Catalina Antigua, MD;  Location: [facility];  Service: Gynecology;  Laterality: N/A;   CYSTOSCOPY N/A 12/29/2018   Procedure: CYSTOSCOPY;  Surgeon: [place] Bing, MD;  Location: [facility] OR;  Service: Gynecology;  Laterality: N/A;   DIAGNOSTIC LAPAROSCOPY  07/24/2010   REMOVAL OF BILATERAL TUBES BLOCKAGE   GASTRIC BYPASS  2004   TOTAL LAPAROSCOPIC HYSTERECTOMY WITH SALPINGECTOMY Right 12/29/2018   Procedure: TOTAL LAPAROSCOPIC HYSTERECTOMY WITH RIGHT SALPINGECTOMY;  Surgeon: [place]rtle [place] Bing, MD;  Location: [facility];  Service: Gynecology;  Laterality: Right;    Family History  Problem Relation Age of Onset   Asthma Mother    Diabetes Mother    Arthritis Mother    Fibromyalgia Mother    COPD Mother        passed at age 65 y/o, hypoxia   Heart disease Mother    Hypertension Father    Diabetes Father    Kidney disease Father    Hypertension Maternal Grandmother    Arthritis Maternal Grandmother    Stroke Maternal Grandmother    Heart disease Maternal Grandfather    Heart defect Daughter     Social History   Socioeconomic History   Marital status: Married    Spouse name: Not on file   Number of children: 2   Years of education: Not on file   Highest education level: Not on file  Occupational History   Occupation: home maker  Tobacco Use   Smoking status: Never   Smokeless tobacco: Never  Vaping Use   Vaping Use: Never used  Substance and Sexual Activity   Alcohol use: Yes    Comment: Social wine   Drug use: No   Sexual activity: Yes    Partners: Male    Birth control/protection: Surgical    Comment: Married  Other Topics  Concern   Not on file  Social History Narrative   Two twin girls   Social Determinants of Health   Financial Resource Strain: Not on file  Food Insecurity: Not on file  Transportation Needs: Not on file  Physical Activity: Not on file  Stress: Not on file  Social Connections: Not on file  Intimate Partner Violence: Not on file    Outpatient Medications Prior to Visit  Medication Sig Dispense Refill   AMBULATORY NON FORMULARY MEDICATION Medication Name: Diltiazem Gel 2% apply pea sized amount per rectum three times a day for 6 weeks 30 g 0   aspirin-acetaminophen-caffeine (EXCEDRIN MIGRAINE) 250-250-65 MG tablet Take 1 tablet by mouth every 6 (six) hours as needed for headache.     cetirizine (ZYRTEC) 10 MG tablet Take 10 mg by mouth daily as needed for allergies.     metFORMIN (GLUCOPHAGE) 500 MG tablet Take 1 tablet  (500 mg total) by mouth 2 (two) times daily with a meal. 60 tablet 5   vitamin B-12 (CYANOCOBALAMIN) 500 MCG tablet Take 500 mcg by mouth daily.     cholecalciferol (VITAMIN D3) 25 MCG (1000 UNIT) tablet      dicyclomine (BENTYL) 10 MG capsule Take 1 capsule (10 mg total) by mouth 4 (four) times daily -  before meals and at bedtime for 10 days. 40 capsule 0   No facility-administered medications prior to visit.    Allergies  Allergen Reactions   Shellfish Allergy Hives    JUST SHRIMP        Objective:    Physical Exam Constitutional:      General: She is not in acute distress.    Appearance: Normal appearance. She is obese. She is not ill-appearing, toxic-appearing or diaphoretic.  Cardiovascular:     Rate and Rhythm: Normal rate and regular rhythm.  Pulmonary:     Effort: Pulmonary effort is normal.     Breath sounds: Normal breath sounds.  Abdominal:     Tenderness: There is no right CVA tenderness.  Neurological:     General: No focal deficit present.     Mental Status: She is alert and oriented to person, place, and time.  Psychiatric:        Mood and Affect: Mood normal.        Behavior: Behavior normal.        Thought Content: Thought content normal.        Judgment: Judgment normal.     BP 122/82   Pulse 85   Temp 98.3 F (36.8 C)   Resp 16   Ht 5\' 6"  (1.676 m)   Wt 269 lb 2 oz (122.1 kg)   LMP 12/12/2018   SpO2 99%   BMI 43.44 kg/m  Wt Readings from Last 3 Encounters:  08/22/21 269 lb 2 oz (122.1 kg)  07/18/21 265 lb (120.2 kg)  06/13/21 262 lb 8 oz (119.1 kg)     Health Maintenance Due  Topic Date Due   COVID-19 Vaccine (3 - Pfizer series) 10/01/2019    There are no preventive care reminders to display for this patient.  Lab Results  Component Value Date   TSH 1.450 07/18/2021   Lab Results  Component Value Date   WBC 6.3 11/22/2020   HGB 12.6 11/22/2020   HCT 40.1 11/22/2020   MCV 73.8 (L) 11/22/2020   PLT 231.0 11/22/2020   Lab  Results  Component Value Date   NA 138 11/22/2020   K 4.1 11/22/2020  CO2 24 11/22/2020   GLUCOSE 81 11/22/2020   BUN 14 11/22/2020   CREATININE 0.71 11/22/2020   BILITOT 0.6 11/22/2020   ALKPHOS 55 11/22/2020   AST 16 11/22/2020   ALT 12 11/22/2020   PROT 6.8 11/22/2020   ALBUMIN 4.1 11/22/2020   CALCIUM 9.3 11/22/2020   ANIONGAP 8 04/12/2019   GFR 106.78 11/22/2020   Lab Results  Component Value Date   HGBA1C 5.2 08/22/2021      Assessment & Plan:   Problem List Items Addressed This Visit       Cardiovascular and Mediastinum   RESOLVED: Transient hypertension   Relevant Orders   EKG 12-Lead (Completed)     Endocrine   PCOS (polycystic ovarian syndrome)    a1c today, improved  Continue metformin 500 mg twice daily       Relevant Medications   Semaglutide-Weight Management (WEGOVY) 0.25 MG/0.5ML SOAJ   Other Relevant Orders   POCT glycosylated hemoglobin (Hb A1C) (Completed)     Genitourinary   Acute cystitis without hematuria    antbx sent to pharmacy, pt to take as directed. Encouraged increased water intake throughout the day. Urine culture/reflex pending results. Choosing to treat due to being symptomatic. If no improvement in the next 2 days pt advised to let me know.        Relevant Medications   nitrofurantoin, macrocrystal-monohydrate, (MACROBID) 100 MG capsule     Other   BMI 45.0-49.9, adult (HCC)   Relevant Medications   Semaglutide-Weight Management (WEGOVY) 0.25 MG/0.5ML SOAJ   Other Relevant Orders   Amb ref to Medical Nutrition Therapy-MNT   Urinary frequency - Primary    poct urine dip in office Urine culture ordered pending results       Relevant Orders   Urine Culture   POCT Urinalysis Dipstick (Automated) (Completed)   Right bundle branch block (RBBB) on electrocardiogram (ECG)    Stable since 2021  However with fmh heart disease/ will send pt to cardiologist for general workup Due to this, phentermine not an option.        Relevant Orders   Ambulatory referral to Cardiology   Morbid obesity (HCC)    Will atempt to RX mounjaro vs wegovy based off insurance.  Also referred to weight management services   Pt came in with multiple concerns, time for ekg, lab work, urine lab draw and discussing concerns totaled 45 minutes in office.      Relevant Medications   Semaglutide-Weight Management (WEGOVY) 0.25 MG/0.5ML SOAJ   Other Relevant Orders   Amb ref to Medical Nutrition Therapy-MNT    Meds ordered this encounter  Medications   Semaglutide-Weight Management (WEGOVY) 0.25 MG/0.5ML SOAJ    Sig: Inject 0.25 mg into the skin once a week.    Dispense:  2 mL    Refill:  2    Order Specific Question:   Supervising Provider    Answer:   BEDSOLE, AMY E [[id number]]   nitrofurantoin, macrocrystal-monohydrate, (MACROBID) 100 MG capsule    Sig: Take 1 capsule (100 mg total) by mouth 2 (two) times daily for 5 days.    Dispense:  10 capsule    Refill:  0    Order Specific Question:   Supervising Provider    Answer:   BEDSOLE, AMY E [[id number]]    Follow-up: No follow-ups on file.    Mort Sawyers, FNP

## 2021-08-22 NOTE — Assessment & Plan Note (Addendum)
Will atempt to RX mounjaro vs wegovy based off insurance.  Also referred to weight management services   Pt came in with multiple concerns, time for ekg, lab work, urine lab draw and discussing concerns totaled 45 minutes in office.

## 2021-08-22 NOTE — Patient Instructions (Signed)
Due to recent changes in healthcare laws, you may see results of your imaging and/or laboratory studies on MyChart before I have had a chance to review them.  I understand that in some cases there may be results that are confusing or concerning to you. Please understand that not all results are received at the same time and often I may need to interpret multiple results in order to provide you with the best plan of care or course of treatment. Therefore, I ask that you please give me 2 business days to thoroughly review all your results before contacting my office for clarification. Should we see a critical lab result, you will be contacted sooner.   It was a pleasure seeing you today! Please do not hesitate to reach out with any questions and or concerns.  Regards,   Cindia Hustead FNP-C  

## 2021-08-22 NOTE — Assessment & Plan Note (Addendum)
Stable since 2021  However with fmh heart disease/ will send pt to cardiologist for general workup Due to this, phentermine not an option.

## 2021-08-22 NOTE — Assessment & Plan Note (Signed)
a1c today, improved  Continue metformin 500 mg twice daily

## 2021-08-22 NOTE — Assessment & Plan Note (Signed)
antbx sent to pharmacy, pt to take as directed. Encouraged increased water intake throughout the day. Urine culture/reflex pending results. Choosing to treat due to being symptomatic. If no improvement in the next 2 days pt advised to let me know.  

## 2021-08-22 NOTE — Progress Notes (Signed)
D/w pt in office, rBB no change since 2021. Asymptomatic however referring to cardiologist due to fmh

## 2021-08-22 NOTE — Assessment & Plan Note (Signed)
poct urine dip in office Urine culture ordered pending results  

## 2021-08-23 ENCOUNTER — Encounter: Payer: Self-pay | Admitting: Family

## 2021-08-23 LAB — URINE CULTURE
MICRO NUMBER:: 13643103
SPECIMEN QUALITY:: ADEQUATE

## 2021-09-04 ENCOUNTER — Telehealth: Payer: Self-pay | Admitting: Family

## 2021-09-04 NOTE — Telephone Encounter (Signed)
Patient called and stated she is waking up with different bruises on her body, and they are black and blue. Patient was sent to access nurse.

## 2021-09-04 NOTE — Telephone Encounter (Signed)
Pt already scheduled for appt with T Dugal FNP on 09/05/21 at 12:20. Sending note to T Dugal FNP and  Tresa Endo CMA.

## 2021-09-04 NOTE — Telephone Encounter (Signed)
[facility] Day - Client TELEPHONE ADVICE RECORD AccessNurse Patient Name: Marilyn Wilkinson Gender: Female DOB: [birth year] Age: 41 Y 7 M 19 D Return Phone Number: [phone] (Primary), [phone] (Secondary) Address: [place]  [address] Client [facility] Day - Client Client Site [facility] - Day Provider AA - PHYSICIAN, NOT LISTED- MD Contact Type Call Who Is Calling Patient / Member / Family / Caregiver Call Type Triage / Clinical Relationship To Patient Self Return Phone Number [phone] (Primary) Chief Complaint Bruise Reason for Call Symptomatic / Request for Health Information Initial Comment Caller was transferred from the office, patient has bruises with different colors. Translation No Nurse Assessment Nurse: Hum[place]et, RN, Marchelle Folks Date/Time (Eastern Time): 09/04/2021 11:09:45 AM Confirm and document reason for call. If symptomatic, describe symptoms. ---caller states she has bruises over the course of a week. random places and no injuries. Does the patient have any new or worsening symptoms? ---Yes Will a triage be completed? ---Yes Related visit to physician within the last 2 weeks? ---Yes Does the PT have any chronic conditions? (i.e. diabetes, asthma, this includes High risk factors for pregnancy, etc.) ---Yes List chronic conditions. ---RBBB Is the patient pregnant or possibly pregnant? (Ask all females between the ages of 60-55) ---No Is this a behavioral health or substance abuse call? ---No Guidelines Guideline Title Affirmed Question Affirmed Notes Nurse Date/Time (Eastern Time) Bruises [1] Not caused by an injury AND [2] < 5 unexplained bruises Humfleet, RN, Marchelle Folks 09/04/2021 11:11:00 AM Heart Rate and Heartbeat Questions [1] Palpitations AND [2] no improvement after using Care Advice Humfleet, RN, Marchelle Folks 09/04/2021 11:13:55 AM PLEASE NOTE:  All timestamps contained within this report are represented as [place] Standard Time. CONFIDENTIALTY NOTICE: This fax transmission is intended only for the addressee. It contains information that is legally privileged, confidential or otherwise protected from use or disclosure. If you are not the intended recipient, you are strictly prohibited from reviewing, disclosing, copying using or disseminating any of this information or taking any action in reliance on or regarding this information. If you have received this fax in error, please notify us immediately by telephone so that we can arrange for its return to [place]. Phone: [phone], Toll-Free: [phone], Fax: [phone] Page: 2 of 2 Call Id: [id number] Disp. Time Lamount Cohen Time) Disposition Final User 09/04/2021 11:13:39 AM SEE PCP WITHIN 3 DAYS Humfleet, RN, Marchelle Folks 09/04/2021 11:19:15 AM SEE PCP WITHIN 3 DAYS Yes Humfleet, RN, Marchelle Folks Final Disposition 09/04/2021 11:19:15 AM SEE PCP WITHIN 3 DAYS Yes Humfleet, RN, Earnestine Leys Disagree/Comply Comply Caller Understands Yes PreDisposition Did not know what to do Care Advice Given Per Guideline SEE PCP WITHIN 3 DAYS: * You need to be seen within 2 or 3 days. * PCP VISIT: Call your doctor (or NP/PA) during regular office hours and make an appointment. A clinic or urgent care center are good places to go for care if your doctor's office is closed or you can't get an appointment. NOTE: If office will be open tomorrow, tell caller to call then, not in 3 days. CARE ADVICE given per Bruises (Adult) guideline. * You become worse CALL BACK IF: EXPECTED COURSE: * Most bruises are fully developed by 24 hours. SEE PCP WITHIN 3 DAYS: * You need to be seen within 2 or 3 days. * PCP VISIT: Call your doctor (or NP/PA) during regular office hours and make an appointment. A clinic or urgent care center are good places  to go for care if your doctor's office is closed or you can't get an appointment. NOTE: If  office will be open tomorrow, tell caller to call then, not in 3 days. AVOID CAFFEINE: * Avoid caffeine-containing beverages. Reason: Caffeine is a stimulant and can aggravate palpitations. * Liquid Intake: Drink adequate liquids, 6 to 8 glasses of water daily. HEALTH BASICS: CARE ADVICE given per Heart Rate and Heartbeat Questions (Adult) guideline. * You become worse CALL BACK IF: * More than 3 extra or skipped beats / minute * Chest pain, lightheadedness or difficulty breathing occurs * Heart beating over 140 beats / minute Comments User: Jaclynn Major, RN Date/Time Lamount Cohen Time): 09/04/2021 11:20:24 AM advised patient to wear her samsung watch and to call back for any alerts. gave other call back instructionsunderstands. Referrals REFERRED TO PCP OFFIC

## 2021-09-04 NOTE — Telephone Encounter (Signed)
Noted  

## 2021-09-05 ENCOUNTER — Other Ambulatory Visit: Payer: Self-pay | Admitting: Family

## 2021-09-05 ENCOUNTER — Ambulatory Visit (INDEPENDENT_AMBULATORY_CARE_PROVIDER_SITE_OTHER): Payer: 59 | Admitting: Family

## 2021-09-05 ENCOUNTER — Encounter: Payer: Self-pay | Admitting: Family

## 2021-09-05 VITALS — BP 116/76 | HR 100 | Temp 98.2°F | Resp 16 | Ht 66.0 in | Wt 267.2 lb

## 2021-09-05 DIAGNOSIS — F411 Generalized anxiety disorder: Secondary | ICD-10-CM | POA: Diagnosis not present

## 2021-09-05 DIAGNOSIS — Z8261 Family history of arthritis: Secondary | ICD-10-CM | POA: Diagnosis not present

## 2021-09-05 DIAGNOSIS — R69 Illness, unspecified: Secondary | ICD-10-CM | POA: Diagnosis not present

## 2021-09-05 DIAGNOSIS — R233 Spontaneous ecchymoses: Secondary | ICD-10-CM | POA: Diagnosis not present

## 2021-09-05 DIAGNOSIS — R21 Rash and other nonspecific skin eruption: Secondary | ICD-10-CM | POA: Insufficient documentation

## 2021-09-05 LAB — PROTIME-INR
INR: 1 ratio (ref 0.8–1.0)
Prothrombin Time: 11.4 s (ref 9.6–13.1)

## 2021-09-05 LAB — APTT: aPTT: 35.4 s (ref 25.4–36.8)

## 2021-09-05 LAB — CBC WITH DIFFERENTIAL/PLATELET
Basophils Absolute: 0 10*3/uL (ref 0.0–0.1)
Basophils Relative: 0.4 % (ref 0.0–3.0)
Eosinophils Absolute: 0.2 10*3/uL (ref 0.0–0.7)
Eosinophils Relative: 2.4 % (ref 0.0–5.0)
HCT: 40.4 % (ref 36.0–46.0)
Hemoglobin: 12.9 g/dL (ref 12.0–15.0)
Lymphocytes Relative: 33.9 % (ref 12.0–46.0)
Lymphs Abs: 2.4 10*3/uL (ref 0.7–4.0)
MCHC: 31.8 g/dL (ref 30.0–36.0)
MCV: 74.7 fl — ABNORMAL LOW (ref 78.0–100.0)
Monocytes Absolute: 0.6 10*3/uL (ref 0.1–1.0)
Monocytes Relative: 8.3 % (ref 3.0–12.0)
Neutro Abs: 3.9 10*3/uL (ref 1.4–7.7)
Neutrophils Relative %: 55 % (ref 43.0–77.0)
Platelets: 248 10*3/uL (ref 150.0–400.0)
RBC: 5.41 Mil/uL — ABNORMAL HIGH (ref 3.87–5.11)
RDW: 16.1 % — ABNORMAL HIGH (ref 11.5–15.5)
WBC: 7.1 10*3/uL (ref 4.0–10.5)

## 2021-09-05 MED ORDER — TRIAMCINOLONE ACETONIDE 0.1 % EX CREA: 1.0000 | TOPICAL_CREAM | Freq: Two times a day (BID) | CUTANEOUS | 0 refills | Status: DC

## 2021-09-05 MED ORDER — HYDROXYZINE PAMOATE 25 MG PO CAPS: 25.0000 mg | ORAL_CAPSULE | Freq: Three times a day (TID) | ORAL | 0 refills | Status: DC | PRN

## 2021-09-05 NOTE — Telephone Encounter (Signed)
Agree with precautions given to pt  Agree with nurse assessment in plan.  Thank you for speaking with them. 

## 2021-09-05 NOTE — Progress Notes (Signed)
Established Patient Office Visit  Subjective:  Patient ID: Marilyn Wilkinson, female    DOB: 12/25/1980  Age: 41 y.o. MRN: [id number]  CC:  Chief Complaint  Patient presents with   Bleeding/Bruising    Not sure where they are coming from. No falls has not hit it on anything.    HPI Marilyn Wilkinson is here today with concerns.   Noticing easy bruising that started last week over the weekend.  She hasn't fallen or had any injury per her knowledge. She has a bruise on her left calf as well as on her right posterior upper arm.   Pinches in her chest intermittent quick pinches. Doesn't matter if exerting energy or not. Denies sob. Denies palpitations.  One cup of coffee a day otherwise drinks water.  Last ekg did show rbbb. She has appt scheduled with cardiologist next week Friday for consult.   Has changed her workout a bit, now doing a bit more jogging. Tolerating well when she is exercising.   At night time her hands itch a lot at night, takes benadryl almost nightly which helps. She states this occurs once every other week or so.   Past Medical History:  Diagnosis Date   Anemia    pt. told to use iron but she admits that she is not consistent in taking     Anxiety    Depression    Fallopian tube disorder    BLOCKAGE ON BILATERAL TUBE   Headache(784.0)    migraines   Hypertension    pt. reports that BP flucuates, has never had any treatment for     In vitro fertilization    Infertility of tubal origin    Polycystic ovarian syndrome    Positive ANA (antinuclear antibody)    Right tubal pregnancy without intrauterine pregnancy 06/19/2017   05/29/17: single dose mtx given   Sleep apnea 2004   has lost weight & no longer having problems in this  area    Small bowel intussusception (HCC) 05/23/2021   Status post hysterectomy 12/29/2018   Transient hypertension 10/14/2018    Past Surgical History:  Procedure Laterality Date   CESAREAN SECTION  06/26/2011   Procedure: CESAREAN  SECTION;  Surgeon: Catalina Antigua, MD;  Location: [facility];  Service: Gynecology;  Laterality: N/A;   CYSTOSCOPY N/A 12/29/2018   Procedure: CYSTOSCOPY;  Surgeon: Montezuma Bing, MD;  Location: [facility] OR;  Service: Gynecology;  Laterality: N/A;   DIAGNOSTIC LAPAROSCOPY  07/24/2010   REMOVAL OF BILATERAL TUBES BLOCKAGE   GASTRIC BYPASS  2004   TOTAL LAPAROSCOPIC HYSTERECTOMY WITH SALPINGECTOMY Right 12/29/2018   Procedure: TOTAL LAPAROSCOPIC HYSTERECTOMY WITH RIGHT SALPINGECTOMY;  Surgeon: Dooly Bing, MD;  Location: [facility] OR;  Service: Gynecology;  Laterality: Right;    Family History  Problem Relation Age of Onset   Asthma Mother    Diabetes Mother    Arthritis Mother    Fibromyalgia Mother    COPD Mother        passed at age 47 y/o, hypoxia   Heart disease Mother    Hypertension Father    Diabetes Father    Kidney disease Father    Hypertension Maternal Grandmother    Arthritis Maternal Grandmother    Stroke Maternal Grandmother    Heart disease Maternal Grandfather    Heart defect Daughter     Social History   Socioeconomic History   Marital status: Married    Spouse name: Not on file   Number of children:  2   Years of education: Not on file   Highest education level: Not on file  Occupational History   Occupation: home maker  Tobacco Use   Smoking status: Never   Smokeless tobacco: Never  Vaping Use   Vaping Use: Never used  Substance and Sexual Activity   Alcohol use: Yes    Comment: Social wine   Drug use: No   Sexual activity: Yes    Partners: Male    Birth control/protection: Surgical    Comment: Married  Other Topics Concern   Not on file  Social History Narrative   Two twin girls   Social Determinants of Health   Financial Resource Strain: Not on file  Food Insecurity: Not on file  Transportation Needs: Not on file  Physical Activity: Not on file  Stress: Not on file  Social Connections: Not on file  Intimate Partner Violence: Not on file     Outpatient Medications Prior to Visit  Medication Sig Dispense Refill   aspirin-acetaminophen-caffeine (EXCEDRIN MIGRAINE) 250-250-65 MG tablet Take 1 tablet by mouth every 6 (six) hours as needed for headache.     cetirizine (ZYRTEC) 10 MG tablet Take 10 mg by mouth daily as needed for allergies.     metFORMIN (GLUCOPHAGE) 500 MG tablet Take 1 tablet (500 mg total) by mouth 2 (two) times daily with a meal. 60 tablet 5   vitamin B-12 (CYANOCOBALAMIN) 500 MCG tablet Take 500 mcg by mouth daily.     AMBULATORY NON FORMULARY MEDICATION Medication Name: Diltiazem Gel 2% apply pea sized amount per rectum three times a day for 6 weeks (Patient not taking: Reported on 09/12/2021) 30 g 0   Semaglutide-Weight Management (WEGOVY) 0.25 MG/0.5ML SOAJ Inject 0.25 mg into the skin once a week. (Patient not taking: Reported on 09/12/2021) 2 mL 2   No facility-administered medications prior to visit.    Allergies  Allergen Reactions   Shellfish Allergy Hives    JUST SHRIMP        Objective:    Physical Exam Constitutional:      Appearance: Normal appearance. She is obese.  Cardiovascular:     Rate and Rhythm: Normal rate and regular rhythm.  Pulmonary:     Effort: Pulmonary effort is normal.     Breath sounds: Normal breath sounds.  Skin:    Comments: Small bruising area on left upper thigh  As well as small bruise on right posterior arm  Neurological:     Mental Status: She is alert.     BP 116/76   Pulse 100   Temp 98.2 F (36.8 C)   Resp 16   Ht 5\' 6"  (1.676 m)   Wt 267 lb 4 oz (121.2 kg)   LMP 12/12/2018   SpO2 97%   BMI 43.14 kg/m  Wt Readings from Last 3 Encounters:  09/12/21 267 lb 6.4 oz (121.3 kg)  09/05/21 267 lb 4 oz (121.2 kg)  08/22/21 269 lb 2 oz (122.1 kg)     Health Maintenance Due  Topic Date Due   COVID-19 Vaccine (3 - Pfizer series) 10/01/2019   INFLUENZA VACCINE  09/10/2021    There are no preventive care reminders to display for this  patient.  Lab Results  Component Value Date   TSH 1.450 07/18/2021   Lab Results  Component Value Date   WBC 7.1 09/05/2021   HGB 12.9 09/05/2021   HCT 40.4 09/05/2021   MCV 74.7 (L) 09/05/2021   PLT 248.0 09/05/2021  Lab Results  Component Value Date   NA 138 11/22/2020   K 4.1 11/22/2020   CO2 24 11/22/2020   GLUCOSE 81 11/22/2020   BUN 14 11/22/2020   CREATININE 0.71 11/22/2020   BILITOT 0.6 11/22/2020   ALKPHOS 55 11/22/2020   AST 16 11/22/2020   ALT 12 11/22/2020   PROT 6.8 11/22/2020   ALBUMIN 4.1 11/22/2020   CALCIUM 9.3 11/22/2020   ANIONGAP 8 04/12/2019   GFR 106.78 11/22/2020   Lab Results  Component Value Date   HGBA1C 5.2 08/22/2021      Assessment & Plan:   Problem List Items Addressed This Visit       Musculoskeletal and Integument   Rash of hand - Primary   Relevant Medications   triamcinolone cream (KENALOG) 0.1 %   Other Relevant Orders   CBC with Differential (Completed)   PT AND PTT   ANA (Completed)   Protime-INR (Completed)   APTT (Completed)   Skin rash    Triamcinolone cream 0.1% given for rx  Apply to rash prn   Ordering ana cbc as well pending results      Relevant Orders   CBC with Differential (Completed)   PT AND PTT   ANA (Completed)   Protime-INR (Completed)   APTT (Completed)     Other   Anxiety state   Relevant Medications   hydrOXYzine (VISTARIL) 25 MG capsule   Other Relevant Orders   Protime-INR (Completed)   APTT (Completed)   Family history of rheumatoid arthritis   Relevant Orders   CBC with Differential (Completed)   PT AND PTT   ANA (Completed)   Protime-INR (Completed)   APTT (Completed)   Easy bruising    Hematologic workup in place to include pt inr ptt        Meds ordered this encounter  Medications   triamcinolone cream (KENALOG) 0.1 %    Sig: Apply 1 Application topically 2 (two) times daily.    Dispense:  30 g    Refill:  0    Order Specific Question:   Supervising Provider     Answer:   BEDSOLE, AMY E [[id number]]   hydrOXYzine (VISTARIL) 25 MG capsule    Sig: Take 1 capsule (25 mg total) by mouth every 8 (eight) hours as needed for anxiety or itching.    Dispense:  30 capsule    Refill:  0    Order Specific Question:   Supervising Provider    Answer:   BEDSOLE, AMY E [[id number]]    Follow-up: No follow-ups on file.    Mort Sawyers, FNP

## 2021-09-07 LAB — ANTI-NUCLEAR AB-TITER (ANA TITER): ANA Titer 1: 1:80 {titer} — ABNORMAL HIGH

## 2021-09-07 LAB — ANA: Anti Nuclear Antibody (ANA): POSITIVE — AB

## 2021-09-09 ENCOUNTER — Encounter: Payer: Self-pay | Admitting: Family

## 2021-09-09 NOTE — Progress Notes (Signed)
Positive ana  Could be a positive for autoimmune disease Referring to rheumatology  Any joint pains? Stiffness in the am?

## 2021-09-12 ENCOUNTER — Encounter: Payer: Self-pay | Admitting: Interventional Cardiology

## 2021-09-12 ENCOUNTER — Ambulatory Visit (INDEPENDENT_AMBULATORY_CARE_PROVIDER_SITE_OTHER): Payer: 59 | Admitting: Interventional Cardiology

## 2021-09-12 VITALS — BP 122/90 | HR 81 | Ht 66.0 in | Wt 267.4 lb

## 2021-09-12 DIAGNOSIS — I4510 Unspecified right bundle-branch block: Secondary | ICD-10-CM | POA: Diagnosis not present

## 2021-09-12 DIAGNOSIS — R9431 Abnormal electrocardiogram [ECG] [EKG]: Secondary | ICD-10-CM | POA: Diagnosis not present

## 2021-09-12 NOTE — Progress Notes (Signed)
Cardiology Office Note   Date:  09/12/2021   ID:  Marilyn Wilkinson, DOB [birth year], MRN [id number]  PCP:  Mort Sawyers, FNP    No chief complaint on file.  Abnormal ECG  Wt Readings from Last 3 Encounters:  09/12/21 267 lb 6.4 oz (121.3 kg)  09/05/21 267 lb 4 oz (121.2 kg)  08/22/21 269 lb 2 oz (122.1 kg)       History of Present Illness: Marilyn Wilkinson is a 41 y.o. female who is being seen today for the evaluation of IRBBB at the request of Mort Sawyers, FNP.   Denies : Chest pain. Dizziness. Leg edema. Nitroglycerin use. Orthopnea. Palpitations. Paroxysmal nocturnal dyspnea. Shortness of breath. Syncope.    She exercises regularly, walking, slow jogging over the past year.  No cardiac symptoms.    Routine ECG for weight loss drug showed IRBBB which was present in 2021 as well.     Past Medical History:  Diagnosis Date   Anemia    pt. told to use iron but she admits that she is not consistent in taking     Anxiety    Depression    Fallopian tube disorder    BLOCKAGE ON BILATERAL TUBE   Headache(784.0)    migraines   Hypertension    pt. reports that BP flucuates, has never had any treatment for     In vitro fertilization    Infertility of tubal origin    Polycystic ovarian syndrome    Positive ANA (antinuclear antibody)    Right tubal pregnancy without intrauterine pregnancy 06/19/2017   05/29/17: single dose mtx given   Sleep apnea 2004   has lost weight & no longer having problems in this  area    Small bowel intussusception (HCC) 05/23/2021   Status post hysterectomy 12/29/2018   Transient hypertension 10/14/2018    Past Surgical History:  Procedure Laterality Date   CESAREAN SECTION  06/26/2011   Procedure: CESAREAN SECTION;  Surgeon: Catalina Antigua, MD;  Location: [facility];  Service: Gynecology;  Laterality: N/A;   CYSTOSCOPY N/A 12/29/2018   Procedure: CYSTOSCOPY;  Surgeon: [place] Bing, MD;  Location: [facility] OR;  Service: Gynecology;  Laterality:  N/A;   DIAGNOSTIC LAPAROSCOPY  07/24/2010   REMOVAL OF BILATERAL TUBES BLOCKAGE   GASTRIC BYPASS  2004   TOTAL LAPAROSCOPIC HYSTERECTOMY WITH SALPINGECTOMY Right 12/29/2018   Procedure: TOTAL LAPAROSCOPIC HYSTERECTOMY WITH RIGHT SALPINGECTOMY;  Surgeon: [place] Bing, MD;  Location[facility] OR;  Service: Gynecology;  Laterality: Right;     Current Outpatient Medications  Medication Sig Dispense Refill   aspirin-acetaminophen-caffeine (EXCEDRIN MIGRAINE) 250-250-65 MG tablet Take 1 tablet by mouth every 6 (six) hours as needed for headache.     cetirizine (ZYRTEC) 10 MG tablet Take 10 mg by mouth daily as needed for allergies.     hydrOXYzine (VISTARIL) 25 MG capsule Take 1 capsule (25 mg total) by mouth every 8 (eight) hours as needed for anxiety or itching. 30 capsule 0   metFORMIN (GLUCOPHAGE) 500 MG tablet Take 1 tablet (500 mg total) by mouth 2 (two) times daily with a meal. 60 tablet 5   vitamin B-12 (CYANOCOBALAMIN) 500 MCG tablet Take 500 mcg by mouth daily.     AMBULATORY NON FORMULARY MEDICATION Medication Name: Diltiazem Gel 2% apply pea sized amount per rectum three times a day for 6 weeks (Patient not taking: Reported on 09/12/2021) 30 g 0   Semaglutide-Weight Management (WEGOVY) 0.25 MG/0.5ML SOAJ Inject 0.25 mg into the  skin once a week. (Patient not taking: Reported on 09/12/2021) 2 mL 2   triamcinolone cream (KENALOG) 0.1 % Apply 1 Application topically 2 (two) times daily. (Patient not taking: Reported on 09/12/2021) 30 g 0   No current facility-administered medications for this visit.    Allergies:   Shellfish allergy    Social History:  The patient  reports that she has never smoked. She has never used smokeless tobacco. She reports current alcohol use. She reports that she does not use drugs.   Family History:  The patient's family history includes Arthritis in her maternal grandmother and mother; Asthma in her mother; COPD in her mother; Diabetes in her father and mother;  Fibromyalgia in her mother; Heart defect in her daughter; Heart disease in her maternal grandfather and mother; Hypertension in her father and maternal grandmother; Kidney disease in her father; Stroke in her maternal grandmother.    ROS:  Please see the history of present illness.   Otherwise, review of systems are positive for difficulty with trying to lose weight.   All other systems are reviewed and negative.    PHYSICAL EXAM: VS:  BP (!) 122/90   Pulse 81   Ht 5\' 6"  (1.676 m)   Wt 267 lb 6.4 oz (121.3 kg)   LMP 12/12/2018   SpO2 95%   BMI 43.16 kg/m  , BMI Body mass index is 43.16 kg/m. GEN: Well nourished, well developed, in no acute distress HEENT: normal Neck: no JVD, carotid bruits, or masses Cardiac: RRR; no murmurs, rubs, or gallops,no edema  Respiratory:  clear to auscultation bilaterally, normal work of breathing GI: soft, nontender, nondistended, + BS MS: no deformity or atrophy Skin: warm and dry, no rash Neuro:  Strength and sensation are intact Psych: euthymic mood, full affect   EKG:   The ekg ordered in July 2023 demonstrates    Recent Labs: 11/22/2020: ALT 12; BUN 14; Creatinine, Ser 0.71; Potassium 4.1; Sodium 138 07/18/2021: TSH 1.450 09/05/2021: Hemoglobin 12.9; Platelets 248.0   Lipid Panel    Component Value Date/Time   CHOL 123 11/22/2020 0838   CHOL 137 07/25/2019 0938   TRIG 49.0 11/22/2020 0838   HDL 49.30 11/22/2020 0838   HDL 50 07/25/2019 0938   CHOLHDL 2 11/22/2020 0838   VLDL 9.8 11/22/2020 0838   LDLCALC 64 11/22/2020 0838   LDLCALC 73 07/25/2019 0938     Other studies Reviewed: Additional studies/ records that were reviewed today with results demonstrating: 2022 total cholesterol 123 HDL 49 LDL 64 triglycerides 49.   ASSESSMENT AND PLAN:  IRBBB: Abnormal ECG.  Given rSR' pattern with left axis deviation, want to rule out ASD.  No CHF sx.  No AFib noted.  Morbid obesity: low carb diet.  Avoid processed food.  Whole food,  plant-based diet.  High-fiber diet.  Wegovy  versus phentermine are her options at this point.  There was some insurance issue with wegovy.  We will check with Pharm.D. regarding phentermine safety. PCOS: on metformin. A1C 5.2. Family h/o cardiac disease- congestive heart failure.  No early CAD in the family.     Current medicines are reviewed at length with the patient today.  The patient concerns regarding her medicines were addressed.  The following changes have been made:  No change  Labs/ tests ordered today include:  No orders of the defined types were placed in this encounter.   Recommend 150 minutes/week of aerobic exercise Low fat, low carb, high fiber diet recommended  Disposition:   FU based on echo   Signed, Lance Muss, MD  09/12/2021 10:24 AM    [facility] [address] [place], [place], [place]  [address] Phone: [phone]; Fax: [phone]

## 2021-09-12 NOTE — Patient Instructions (Signed)
Medication Instructions:  ?Your physician recommends that you continue on your current medications as directed. Please refer to the Current Medication list given to you today. ? ?*If you need a refill on your cardiac medications before your next appointment, please call your pharmacy* ? ? ?Lab Work: ?none ?If you have labs (blood work) drawn today and your tests are completely normal, you will receive your results only by: ?MyChart Message (if you have MyChart) OR ?A paper copy in the mail ?If you have any lab test that is abnormal or we need to change your treatment, we will call you to review the results. ? ? ?Testing/Procedures: ?Your physician has requested that you have an echocardiogram. Echocardiography is a painless test that uses sound waves to create images of your heart. It provides your doctor with information about the size and shape of your heart and how well your heart?s chambers and valves are working. This procedure takes approximately one hour. There are no restrictions for this procedure. ? ? ? ?Follow-Up: ?At [facility], you and your health needs are our priority.  As part of our continuing mission to provide you with exceptional heart care, we have created designated Provider Care Teams.  These Care Teams include your primary Cardiologist (physician) and Advanced Practice Providers (APPs -  Physician Assistants and Nurse Practitioners) who all work together to provide you with the care you need, when you need it. ? ?We recommend signing up for the patient portal called "MyChart".  Sign up information is provided on this After Visit Summary.  MyChart is used to connect with patients for Virtual Visits (Telemedicine).  Patients are able to view lab/test results, encounter notes, upcoming appointments, etc.  Non-urgent messages can be sent to your provider as well.   ?To learn more about what you can do with MyChart, go to [email or web address].   ? ?Your next appointment:   ?As needed ? ?The  format for your next appointment:   ?In Person ? ?Provider:   ?Jayadeep Varanasi, MD   ? ? ?Other Instructions ?  ? ?Important Information About Sugar ? ? ? ? ? ? ?

## 2021-09-16 ENCOUNTER — Ambulatory Visit (HOSPITAL_COMMUNITY): Payer: 59 | Attending: Interventional Cardiology

## 2021-09-16 DIAGNOSIS — F411 Generalized anxiety disorder: Secondary | ICD-10-CM | POA: Insufficient documentation

## 2021-09-16 DIAGNOSIS — R233 Spontaneous ecchymoses: Secondary | ICD-10-CM | POA: Insufficient documentation

## 2021-09-16 DIAGNOSIS — Z8261 Family history of arthritis: Secondary | ICD-10-CM | POA: Insufficient documentation

## 2021-09-16 DIAGNOSIS — R21 Rash and other nonspecific skin eruption: Secondary | ICD-10-CM | POA: Insufficient documentation

## 2021-09-16 DIAGNOSIS — R9431 Abnormal electrocardiogram [ECG] [EKG]: Secondary | ICD-10-CM

## 2021-09-16 LAB — ECHOCARDIOGRAM COMPLETE
Area-P 1/2: 3.99 cm2
S' Lateral: 2.8 cm

## 2021-09-16 NOTE — Assessment & Plan Note (Signed)
Hematologic workup in place to include pt inr ptt

## 2021-09-16 NOTE — Assessment & Plan Note (Addendum)
Triamcinolone cream 0.1% given for rx  Apply to rash prn   Ordering ana cbc as well pending results

## 2021-09-18 ENCOUNTER — Ambulatory Visit: Payer: 59 | Admitting: Gastroenterology

## 2021-09-23 ENCOUNTER — Encounter: Payer: Self-pay | Admitting: Interventional Cardiology

## 2021-10-09 ENCOUNTER — Encounter: Payer: 59 | Attending: Family | Admitting: Dietician

## 2021-10-09 ENCOUNTER — Encounter: Payer: Self-pay | Admitting: Dietician

## 2021-10-09 VITALS — Ht 66.0 in | Wt 263.1 lb

## 2021-10-09 DIAGNOSIS — Z6841 Body Mass Index (BMI) 40.0 and over, adult: Secondary | ICD-10-CM | POA: Diagnosis not present

## 2021-10-09 DIAGNOSIS — E282 Polycystic ovarian syndrome: Secondary | ICD-10-CM | POA: Insufficient documentation

## 2021-10-09 DIAGNOSIS — Z713 Dietary counseling and surveillance: Secondary | ICD-10-CM | POA: Insufficient documentation

## 2021-10-09 NOTE — Progress Notes (Signed)
Medical Nutrition Therapy: Visit start time: 0945  end time: 1045  Assessment:   Referral Diagnosis: obesity Other medical history/ diagnoses: history of PCOS Psychosocial issues/ stress concerns: current mild depression symptoms some days per patient, due to upcoming birthday of her mother who passed away 4 years ago.  Medications, supplements: reconciled list in medical record   Preferred learning method:  Auditory Visual   Current weight: 263.1lbs Height: 5'6" BMI: 42.47 Patient's personal weight goal: 180lbs  Progress and evaluation:  Patient has been losing weight since last fall by following low carbohydrate and low fat eating pattern, from 320s down to 250s, but has plateau-ed since 04/2021.   She reports bariatric surgery 9-10 years ago, lost about 180lbs, then gradually resumed higher caloric intake and regained weight. She has been on Metformin for PCOS, but is currently taking sporadically -- sometimes forgets. She has been denied coverage for [facility].  Food allergies: shrimp Special diet practices: no Patient seeks help with overcoming weight loss plateau, resuming weight loss to her goal of 180lbs or less.    Dietary Intake:  Usual eating pattern includes 3 meals and 1-2 snacks per day. Dining out frequency: 1 meals per week. Who plans meals/ buys groceries? self Who prepares meals? self  Breakfast: egg white, [place] sausage with bell pepper, onion, occ with 1 pc low cal bread, coffee with half and half and splenda Snack: usu none; occ string cheese +/ or fruit Lunch: flavored tuna pouch with crackers and sometimes salad Snack: occ popcorn (fresh, not bag) Supper: usu cooks, out once a week; starch ie potato or rice + salmon/ chicken/ fish + veg ie asparagus/ broccoli Snack: none Beverages: water, sparking water, coffee in am  Physical activity: cardio 45 minutes, 3-4 times per week   Intervention:   Nutrition Care Education:   Basic nutrition: basic food  groups; appropriate nutrient balance; appropriate meal and snack schedule; general nutrition guidelines    Weight control: importance of low sugar and low fat choices; portion control; estimated energy needs for weight loss at 1400-1600 kcal, provided guidance for 40% CHO, 30% protein, 30% fat; healthy carb choices for promoting satiety, limiting glycemic response; discussed physical activity and adding strength training exercises; discussed tracking food intake.  Other intervention notes: Patient is currently following a healthy eating pattern; she is motivated to continue. Established goals for shifting macronutrient balance slightly, adding strength/ resistance exercise  Patient declined scheduling follow up at this time; she will schedule later as needed.   Nutritional Diagnosis:  [place]-2.1 Inpaired nutrition utilization As related to PCOS.  As evidenced by medical diagnosis and treatment with MetFORMIN. [place]-3.3 Overweight/obesity As related to history of excess calories, PCOS, stress.  As evidenced by patient with current BMI of 42.47, following low carbohydrate eating pattern to resume weight loss after plateau.   Education Materials given:  Designer, industrial/product with food lists, sample meal pattern Sample menus Exercise packet Visit summary with goals/ instructions   Learner/ who was taught:  Patient   Level of understanding: Verbalizes/ demonstrates competency   Demonstrated degree of understanding via:   Teach back Learning barriers: None  Willingness to learn/ readiness for change: Eager, change in progress   Monitoring and Evaluation:  Dietary intake, exercise, and body weight      follow up: prn

## 2021-10-09 NOTE — Patient Instructions (Signed)
Consider joining gym for access to strength building, or do some strength exercise at home such as squats, lunges, wall push-ups, planks, etc. Or try an exercise app such as FitOn, or youtube videos.  Include 15-30 grams of carb with each meal (1-2 servings). After a few weeks, increase to 30g consistently, then 30-45 grams. This will help ensure adequate daily nutrition. Another option is to resume tracking food intake for a few days or longer. Goal is 1400-1600 calories daily.  Schedule follow up as needed, call sooner with any questions or concerns.

## 2021-10-28 ENCOUNTER — Encounter: Payer: Self-pay | Admitting: Family

## 2021-10-28 DIAGNOSIS — Z9884 Bariatric surgery status: Secondary | ICD-10-CM

## 2021-11-04 NOTE — Addendum Note (Signed)
Addended by: Eugenia Pancoast on: 11/04/2021 07:33 AM   Modules accepted: Orders

## 2021-12-05 ENCOUNTER — Encounter: Payer: Self-pay | Admitting: Oncology

## 2021-12-10 ENCOUNTER — Encounter (INDEPENDENT_AMBULATORY_CARE_PROVIDER_SITE_OTHER): Payer: Self-pay

## 2022-01-29 ENCOUNTER — Telehealth: Payer: 59 | Admitting: Family Medicine

## 2022-01-29 DIAGNOSIS — J019 Acute sinusitis, unspecified: Secondary | ICD-10-CM | POA: Diagnosis not present

## 2022-01-29 DIAGNOSIS — B9789 Other viral agents as the cause of diseases classified elsewhere: Secondary | ICD-10-CM

## 2022-01-29 MED ORDER — FLUTICASONE PROPIONATE 50 MCG/ACT NA SUSP: 2.0000 | Freq: Every day | NASAL | 0 refills | Status: DC

## 2022-01-29 NOTE — Progress Notes (Signed)

## 2022-03-10 ENCOUNTER — Encounter (INDEPENDENT_AMBULATORY_CARE_PROVIDER_SITE_OTHER): Payer: Self-pay | Admitting: Family Medicine

## 2022-03-18 ENCOUNTER — Ambulatory Visit
Admission: EM | Admit: 2022-03-18 | Discharge: 2022-03-18 | Disposition: A | Discharge status: Home / Self Care | Payer: 59 | Attending: Nurse Practitioner | Admitting: Nurse Practitioner

## 2022-03-18 DIAGNOSIS — R058 Other specified cough: Secondary | ICD-10-CM | POA: Diagnosis not present

## 2022-03-18 DIAGNOSIS — Z1152 Encounter for screening for COVID-19: Secondary | ICD-10-CM | POA: Insufficient documentation

## 2022-03-18 DIAGNOSIS — J029 Acute pharyngitis, unspecified: Secondary | ICD-10-CM | POA: Diagnosis not present

## 2022-03-18 DIAGNOSIS — Z20818 Contact with and (suspected) exposure to other bacterial communicable diseases: Secondary | ICD-10-CM | POA: Insufficient documentation

## 2022-03-18 DIAGNOSIS — R6889 Other general symptoms and signs: Secondary | ICD-10-CM | POA: Diagnosis not present

## 2022-03-18 LAB — POCT RAPID STREP A (OFFICE): Rapid Strep A Screen: NEGATIVE

## 2022-03-18 MED ORDER — OSELTAMIVIR PHOSPHATE 75 MG PO CAPS: 75.0000 mg | ORAL_CAPSULE | Freq: Two times a day (BID) | ORAL | 0 refills | Status: AC

## 2022-03-18 MED ORDER — PROMETHAZINE-DM 6.25-15 MG/5ML PO SYRP: 5.0000 mL | ORAL_SOLUTION | Freq: Four times a day (QID) | ORAL | 0 refills | Status: DC | PRN

## 2022-03-18 NOTE — Discharge Instructions (Addendum)
Your strep test was negative in clinic.  I will culture this given your known exposure.  We will also send out a COVID test.  The clinical contact you with results of this is positive.  We do not call negative results. Start Tamiflu twice daily for 5 days Promethazine DM cough syrup as needed for cough.  Please note this medication can make you drowsy.  Do not drink alcohol or drive while on this medication Over-the-counter Tylenol or ibuprofen as needed for body aches and fever Rest and fluids Follow-up with your PCP in 2 to 3 days for recheck Please go to the ER if you have any worsening symptoms

## 2022-03-18 NOTE — ED Triage Notes (Signed)
Pt c/o sore throat headache, fatigue, SOB, cough, chills and fatigue.  At home covid test was negative last night.   Home interventions: alka seltzer cold and flu, aleve PM  Started: Sunday

## 2022-03-18 NOTE — ED Provider Notes (Signed)
[facility]    CSN: [id number] Arrival date & time: 03/18/22  1347      History   Chief Complaint Chief Complaint  Patient presents with   Cough   Fever   Generalized Body Aches   Headache   Sore Throat   Fatigue   Chills    HPI Marilyn Wilkinson is a 42 y.o. female who presents for evaluation of URI symptoms for 2 days. Patient reports associated symptoms of cough, subjective fevers, body aches, headache, sore throat, fatigue, chills. Denies N/V/D, documented fevers, shortness of breath. Patient does not have a hx of asthma or smoking.  Patient daughter had strep throat last week.  No recent travel. Pt is vaccinated for COVID. Pt is not vaccinated for flu this season.  Reports negative home COVID test yesterday.  Pt has taken Alka-Seltzer cold and flu and Aleve OTC for symptoms. Pt has no other concerns at this time.    Cough Associated symptoms: chills, fever, rhinorrhea and sore throat   Fever Associated symptoms: chills, cough, rhinorrhea and sore throat   Headache Associated symptoms: cough, fatigue, fever and sore throat   Sore Throat    Past Medical History:  Diagnosis Date   Anemia    pt. told to use iron but she admits that she is not consistent in taking     Anxiety    Depression    Fallopian tube disorder    BLOCKAGE ON BILATERAL TUBE   Headache(784.0)    migraines   Hypertension    pt. 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/23/2021   Status post hysterectomy 12/29/2018   Transient hypertension 10/14/2018    Patient Active Problem List   Diagnosis Date Noted   Anxiety state  09/16/2021   Family history of rheumatoid arthritis 09/16/2021   Skin rash 09/16/2021   Easy bruising 09/16/2021   Rash of hand 09/05/2021   Right bundle branch block (RBBB) on electrocardiogram (ECG) 08/22/2021   Morbid obesity ([place]) 08/22/2021   Endometriosis 02/06/2021   Migraine without aura and responsive to treatment 02/06/2021   Alpha thalassemia trait 07/25/2019   Microcytosis 02/03/2019   IDA (iron deficiency anemia) 02/03/2019   Vitamin D deficiency 01/24/2019   BMI 45.0-49.9, adult ([place]) 05/25/2017   Weight gain following gastric bypass surgery 01/23/2015   PCOS (polycystic ovarian syndrome) 03/03/2011    Past Surgical History:  Procedure Laterality Date   CESAREAN SECTION  06/26/2011   Procedure: CESAREAN SECTION;  Surgeon: Mora Bellman, MD;  Location: [facility];  Service: Gynecology;  Laterality: N/A;   CYSTOSCOPY N/A 12/29/2018   Procedure: CYSTOSCOPY;  Surgeon: Aletha Halim, MD;  Location: [place];  Service: Gynecology;  Laterality: N/A;   DIAGNOSTIC LAPAROSCOPY  07/24/2010   REMOVAL OF BILATERAL TUBES BLOCKAGE   GASTRIC BYPASS  2004   TOTAL LAPAROSCOPIC HYSTERECTOMY WITH SALPINGECTOMY Right 12/29/2018   Procedure: TOTAL LAPAROSCOPIC HYSTERECTOMY WITH RIGHT SALPINGECTOMY;  Surgeon: Aletha Halim, MD;  Location: [place];  Service: Gynecology;  Laterality: Right;    OB History     Gravida  2   Para  1   Term  0   Preterm  1   AB  1   Living  2      SAB  0   IAB  0   Ectopic  1   Multiple  1   Live Births  2        Obstetric Comments  06/2017: rt tubal ectopic. MTx used          Home Medications    Prior to Admission medications   Medication Sig Start Date End Date Taking? Authorizing Provider  oseltamivir (TAMIFLU) 75 MG capsule Take 1 capsule (75 mg total) by mouth every 12 (twelve) hours for 5 days. 03/18/22 03/23/22 Yes Melynda Ripple, NP  promethazine-dextromethorphan (PROMETHAZINE-DM) 6.25-15 MG/5ML syrup Take 5 mLs by mouth 4  (four) times daily as needed for cough. 03/18/22  Yes Melynda Ripple, NP  aspirin-acetaminophen-caffeine (EXCEDRIN MIGRAINE) [phone] MG tablet Take 1 tablet by mouth every 6 (six) hours as needed for headache.    [provider]  cetirizine (ZYRTEC) 10 MG tablet Take 10 mg by mouth daily as needed for allergies.    [provider]  Cholecalciferol (VITAMIN D3 PO) Vitamin D    [provider]  fluticasone (FLONASE) 50 MCG/ACT nasal spray Place 2 sprays into both nostrils daily. 01/29/22   Perlie Mayo, NP  hydrOXYzine (VISTARIL) 25 MG capsule Take 1 capsule (25 mg total) by mouth every 8 (eight) hours as needed for anxiety or itching. 09/05/21   Eugenia Pancoast, FNP  metFORMIN (GLUCOPHAGE) 500 MG tablet Take 1 tablet (500 mg total) by mouth 2 (two) times daily with a meal. 07/18/21   Aletha Halim, MD  triamcinolone cream (KENALOG) 0.1 % Apply 1 Application topically 2 (two) times daily. 09/05/21   Eugenia Pancoast, FNP  vitamin B-12 (CYANOCOBALAMIN) 500 MCG tablet Take 500 mcg by mouth daily. 04/16/20   [provider]    Family History Family History  Problem Relation Age of Onset   Asthma Mother    Diabetes Mother    Arthritis Mother    Fibromyalgia Mother    COPD Mother        passed at age 37 y/o, hypoxia   Heart disease Mother    Hypertension Father    Diabetes Father    Kidney disease Father    Hypertension Maternal Grandmother    Arthritis Maternal Grandmother    Stroke Maternal Grandmother    Heart disease Maternal Grandfather    Heart defect Daughter     Social History Social History   Tobacco Use   Smoking status: Never   Smokeless tobacco: Never  Vaping Use   Vaping Use: Never used  Substance Use Topics   Alcohol use: Not Currently   Drug use: No     Allergies   Shellfish allergy   Review of Systems Review of Systems  Constitutional:  Positive for chills, fatigue and fever.  HENT:  Positive for rhinorrhea and sore throat.    Respiratory:  Positive for cough.      Physical Exam Triage Vital Signs ED Triage Vitals  Enc Vitals Group     BP 03/18/22 1420 (!) 140/96     Pulse Rate 03/18/22 1420 (!) 122     Resp 03/18/22 1420 20     Temp 03/18/22 1420 100 F (37.8 C)     Temp Source 03/18/22 1420 Oral     SpO2 03/18/22 1420 99 %  Weight --      Height --      Head Circumference --      Peak Flow --      Pain Score 03/18/22 1423 8     Pain Loc --      Pain Edu? --      Excl. in GC? --    No data found.  Updated Vital Signs BP (!) 140/96 (BP Location: Left Arm)   Pulse (!) 122   Temp 100 F (37.8 C) (Oral)   Resp 20   LMP 12/12/2018   SpO2 99%   Visual Acuity Right Eye Distance:   Left Eye Distance:   Bilateral Distance:    Right Eye Near:   Left Eye Near:    Bilateral Near:     Physical Exam Vitals and nursing note reviewed.  Constitutional:      General: She is not in acute distress.    Appearance: She is well-developed. She is not ill-appearing.  HENT:     Head: Normocephalic and atraumatic.     Right Ear: Tympanic membrane and ear canal normal.     Left Ear: Tympanic membrane and ear canal normal.     Nose: Congestion present.     Mouth/Throat:     Mouth: Mucous membranes are moist.     Pharynx: Oropharynx is clear. Uvula midline. Posterior oropharyngeal erythema present.     Tonsils: No tonsillar exudate or tonsillar abscesses.  Eyes:     Conjunctiva/sclera: Conjunctivae normal.     Pupils: Pupils are equal, round, and reactive to light.  Cardiovascular:     Rate and Rhythm: Regular rhythm. Tachycardia present.     Heart sounds: Normal heart sounds.     Comments: Mildly tachycardia in setting of low-grade fever Pulmonary:     Effort: Pulmonary effort is normal.     Breath sounds: Normal breath sounds.  Musculoskeletal:     Cervical back: Normal range of motion and neck supple.  Lymphadenopathy:     Cervical: No cervical adenopathy.  Skin:    General: Skin is  warm and dry.  Neurological:     General: No focal deficit present.     Mental Status: She is alert and oriented to person, place, and time.  Psychiatric:        Mood and Affect: Mood normal.        Behavior: Behavior normal.      UC Treatments / Results  Labs (all labs ordered are listed, but only abnormal results are displayed) Labs Reviewed  SARS CORONAVIRUS 2 (TAT 6-24 HRS)  CULTURE, GROUP A STREP [facility])  POCT RAPID STREP A (OFFICE)    EKG   Radiology No results found.  Procedures Procedures (including critical care time)  Medications Ordered in UC Medications - No data to display  Initial Impression / Assessment and Plan / UC Course  I have reviewed the triage vital signs and the nursing notes.  Pertinent labs & imaging results that were available during my care of the patient were reviewed by me and considered in my medical decision making (see chart for details).     Reviewed exam and symptoms with patient.  No red flags on exam. Negative rapid strep will culture given known exposure COVID PCR and will contact if positive Discussed flulike symptoms.  Start Tamiflu twice daily for 5 days Promethazine DM as needed for cough.  Side effect profile reviewed Rest and fluids OTC analgesics for body aches and fever management PCP  follow-up 2 to 3 days for recheck ER precautions reviewed and patient verbalized understanding Final Clinical Impressions(s) / UC Diagnoses   Final diagnoses:  Sore throat  Exposure to strep throat  Flu-like symptoms     Discharge Instructions      Your strep test was negative in clinic.  I will culture this given your known exposure.  We will also send out a COVID test.  The clinical contact you with results of this is positive.  We do not call negative results. Start Tamiflu twice daily for 5 days Promethazine DM cough syrup as needed for cough.  Please note this medication can make you drowsy.  Do not drink alcohol or drive  while on this medication Over-the-counter Tylenol or ibuprofen as needed for body aches and fever Rest and fluids Follow-up with your PCP in 2 to 3 days for recheck Please go to the ER if you have any worsening symptoms     ED Prescriptions     Medication Sig Dispense Auth. Provider   oseltamivir (TAMIFLU) 75 MG capsule Take 1 capsule (75 mg total) by mouth every 12 (twelve) hours for 5 days. 10 capsule Melynda Ripple, NP   promethazine-dextromethorphan (PROMETHAZINE-DM) 6.25-15 MG/5ML syrup Take 5 mLs by mouth 4 (four) times daily as needed for cough. 118 mL Melynda Ripple, NP      PDMP not reviewed this encounter.   Melynda Ripple, NP 03/18/22 1501

## 2022-03-19 LAB — SARS CORONAVIRUS 2 (TAT 6-24 HRS): SARS Coronavirus 2: NEGATIVE

## 2022-03-20 LAB — CULTURE, GROUP A STREP (THRC)

## 2022-06-19 ENCOUNTER — Encounter: Payer: Self-pay | Admitting: Family

## 2022-06-19 ENCOUNTER — Ambulatory Visit: Payer: 59 | Admitting: Family

## 2022-06-19 VITALS — BP 124/72 | HR 93 | Temp 97.8°F | Ht 66.0 in | Wt 268.4 lb

## 2022-06-19 DIAGNOSIS — M2550 Pain in unspecified joint: Secondary | ICD-10-CM | POA: Insufficient documentation

## 2022-06-19 DIAGNOSIS — R233 Spontaneous ecchymoses: Secondary | ICD-10-CM

## 2022-06-19 DIAGNOSIS — D509 Iron deficiency anemia, unspecified: Secondary | ICD-10-CM | POA: Diagnosis not present

## 2022-06-19 DIAGNOSIS — R519 Headache, unspecified: Secondary | ICD-10-CM

## 2022-06-19 DIAGNOSIS — G43009 Migraine without aura, not intractable, without status migrainosus: Secondary | ICD-10-CM | POA: Diagnosis not present

## 2022-06-19 DIAGNOSIS — N951 Menopausal and female climacteric states: Secondary | ICD-10-CM | POA: Insufficient documentation

## 2022-06-19 DIAGNOSIS — R232 Flushing: Secondary | ICD-10-CM | POA: Insufficient documentation

## 2022-06-19 MED ORDER — TOPIRAMATE 50 MG PO TABS: ORAL_TABLET | ORAL | 0 refills | Status: DC

## 2022-06-19 MED ORDER — SUMATRIPTAN SUCCINATE 50 MG PO TABS: 50.0000 mg | ORAL_TABLET | ORAL | 0 refills | Status: DC | PRN

## 2022-06-19 NOTE — Progress Notes (Signed)
Established Patient Office Visit  Subjective:      CC:  Chief Complaint  Patient presents with   Headache    HPI: Marilyn Wilkinson is a 42 y.o. female presenting on 06/19/2022 for Headache . Last weekend with migraine, during the week still having headaches but not as 'migraine like' . The migraines have increased to once every other week that last 1-2 days long. When she gets the migraines she is taking excedrin migraine, and tried aleve last week as well which finally helped her to get some relief.   When the migraines occur, with aura prior, then light and sound sensitive, no nausea or vomiting or diarrhea. No tingling or numbness.   The headaches she describes are posterior scalp and temporal areas. She also feels some neck tension at times with this. She is also taking excedrin migraine almost daily or every other day.   Only slight increased stress that she can note.  Is drinking a good amount of water during the day.   Denies sinus allergy symptoms. Zyrtec maybe 1-2 times a week.      Social history:  Relevant past medical, surgical, family and social history reviewed and updated as indicated. Interim medical history since our last visit reviewed.  Allergies and medications reviewed and updated.  DATA REVIEWED: CHART IN EPIC     ROS: Negative unless specifically indicated above in HPI.    Current Outpatient Medications:    aspirin-acetaminophen-caffeine (EXCEDRIN MIGRAINE) 250-250-65 MG tablet, Take 1 tablet by mouth every 6 (six) hours as needed for headache., Disp: , Rfl:    cetirizine (ZYRTEC) 10 MG tablet, Take 10 mg by mouth daily as needed for allergies., Disp: , Rfl:    Cholecalciferol (VITAMIN D3 PO), Vitamin D, Disp: , Rfl:    fluticasone (FLONASE) 50 MCG/ACT nasal spray, Place 2 sprays into both nostrils daily., Disp: 16 g, Rfl: 0   metFORMIN (GLUCOPHAGE) 500 MG tablet, Take 1 tablet (500 mg total) by mouth 2 (two) times daily with a meal., Disp: 60  tablet, Rfl: 5   SUMAtriptan (IMITREX) 50 MG tablet, Take 1 tablet (50 mg total) by mouth every 2 (two) hours as needed for migraine. May repeat in 2 hours if headache persists or recurs., Disp: 10 tablet, Rfl: 0   topiramate (TOPAMAX) 50 MG tablet, Take 0.5 tablets (25 mg total) by mouth daily for 7 days, THEN 0.5 tablets (25 mg total) 2 (two) times daily for 7 days. Then 0.5 tablet qam (25 mg) and one tablet (50 mg) qhs for seven days,  then increase to 50 mg twice daily.., Disp: 56 tablet, Rfl: 0   vitamin B-12 (CYANOCOBALAMIN) 500 MCG tablet, Take 500 mcg by mouth daily., Disp: , Rfl:       Objective:    BP 124/72 (BP Location: Left Arm)   Pulse 93   Temp 97.8 F (36.6 C) (Temporal)   Ht 5\' 6"  (1.676 m)   Wt 268 lb 6.4 oz (121.7 kg)   LMP 12/12/2018   SpO2 98%   BMI 43.32 kg/m   Wt Readings from Last 3 Encounters:  06/19/22 268 lb 6.4 oz (121.7 kg)  10/09/21 263 lb 1.6 oz (119.3 kg)  09/12/21 267 lb 6.4 oz (121.3 kg)    Physical Exam Constitutional:      General: She is not in acute distress.    Appearance: Normal appearance. She is normal weight. She is not ill-appearing, toxic-appearing or diaphoretic.  HENT:     Head:  Normocephalic.     Nose:     Right Turbinates: Enlarged and swollen.     Left Turbinates: Enlarged and swollen.  Cardiovascular:     Rate and Rhythm: Normal rate.  Pulmonary:     Effort: Pulmonary effort is normal.  Musculoskeletal:        General: Normal range of motion.  Neurological:     General: No focal deficit present.     Mental Status: She is alert and oriented to person, place, and time. Mental status is at baseline.     Cranial Nerves: Cranial nerves 2-12 are intact. No cranial nerve deficit.  Psychiatric:        Mood and Affect: Mood normal.        Behavior: Behavior normal.        Thought Content: Thought content normal.        Judgment: Judgment normal.           Assessment & Plan:  Iron deficiency anemia, unspecified iron  deficiency anemia type -     CBC; Future -     IBC + Ferritin; Future  Migraine without aura and responsive to treatment Assessment & Plan: Trial topamax goal is to get to 50 mg twice daily  Also rx sumatriptan 50 mg po qd for one dose for abruption of headaches.  Keep up with water intake.  Try to avoid triggers as able Tsh sed rate crp ordered pending results  Orders: -     Topiramate; Take 0.5 tablets (25 mg total) by mouth daily for 7 days, THEN 0.5 tablets (25 mg total) 2 (two) times daily for 7 days. Then 0.5 tablet qam (25 mg) and one tablet (50 mg) qhs for seven days,  then increase to 50 mg twice daily..  Dispense: 56 tablet; Refill: 0 -     SUMAtriptan Succinate; Take 1 tablet (50 mg total) by mouth every 2 (two) hours as needed for migraine. May repeat in 2 hours if headache persists or recurs.  Dispense: 10 tablet; Refill: 0 -     C-reactive protein; Future -     Sedimentation rate; Future -     T4, free; Future -     TSH; Future  Easy bruising -     CBC; Future  Frequent headaches -     Topiramate; Take 0.5 tablets (25 mg total) by mouth daily for 7 days, THEN 0.5 tablets (25 mg total) 2 (two) times daily for 7 days. Then 0.5 tablet qam (25 mg) and one tablet (50 mg) qhs for seven days,  then increase to 50 mg twice daily..  Dispense: 56 tablet; Refill: 0 -     Basic metabolic panel; Future     Return in about 1 month (around 07/20/2022) for can be video visit if she prefers .  Mort Sawyers, MSN, APRN, FNP-C [facility]

## 2022-06-23 DIAGNOSIS — R519 Headache, unspecified: Secondary | ICD-10-CM | POA: Insufficient documentation

## 2022-06-23 NOTE — Assessment & Plan Note (Signed)
Trial topamax goal is to get to 50 mg twice daily  Also rx sumatriptan 50 mg po qd for one dose for abruption of headaches.  Keep up with water intake.  Try to avoid triggers as able Tsh sed rate crp ordered pending results

## 2022-07-19 ENCOUNTER — Other Ambulatory Visit: Payer: Self-pay | Admitting: Family

## 2022-07-19 DIAGNOSIS — G43009 Migraine without aura, not intractable, without status migrainosus: Secondary | ICD-10-CM

## 2022-07-19 DIAGNOSIS — R519 Headache, unspecified: Secondary | ICD-10-CM

## 2022-07-20 ENCOUNTER — Other Ambulatory Visit: Payer: Self-pay | Admitting: Family

## 2022-07-20 DIAGNOSIS — G43009 Migraine without aura, not intractable, without status migrainosus: Secondary | ICD-10-CM

## 2022-07-21 ENCOUNTER — Encounter: Payer: Self-pay | Admitting: Obstetrics and Gynecology

## 2022-07-21 ENCOUNTER — Ambulatory Visit (INDEPENDENT_AMBULATORY_CARE_PROVIDER_SITE_OTHER): Payer: 59 | Admitting: Obstetrics and Gynecology

## 2022-07-21 ENCOUNTER — Telehealth (INDEPENDENT_AMBULATORY_CARE_PROVIDER_SITE_OTHER): Payer: 59 | Admitting: Family

## 2022-07-21 ENCOUNTER — Encounter: Payer: Self-pay | Admitting: Family

## 2022-07-21 VITALS — Ht 66.0 in | Wt 268.0 lb

## 2022-07-21 VITALS — BP 119/89 | HR 96 | Ht 66.0 in | Wt 270.0 lb

## 2022-07-21 DIAGNOSIS — G43009 Migraine without aura, not intractable, without status migrainosus: Secondary | ICD-10-CM

## 2022-07-21 DIAGNOSIS — Z1339 Encounter for screening examination for other mental health and behavioral disorders: Secondary | ICD-10-CM

## 2022-07-21 DIAGNOSIS — Z01419 Encounter for gynecological examination (general) (routine) without abnormal findings: Secondary | ICD-10-CM

## 2022-07-21 DIAGNOSIS — Z1231 Encounter for screening mammogram for malignant neoplasm of breast: Secondary | ICD-10-CM | POA: Diagnosis not present

## 2022-07-21 NOTE — Progress Notes (Signed)
Virtual Visit via Video note  I connected with Marilyn Wilkinson on 07/21/22 at home by video and verified that I am speaking with the correct person using two identifiers.  The provider, Mort Sawyers, FNP is located in their office at time of visit.  I discussed the limitations, risks, security and privacy concerns of performing an evaluation and management service by video and the availability of in person appointments. I also discussed with the patient that there may be a patient responsible charge related to this service. The patient expressed understanding and agreed to proceed.  Subjective: PCP: Mort Sawyers, FNP  Chief Complaint  Patient presents with   Migraine    Migraine     Migraine: was placed on trial topamax , she went up to 50 mg once daily and frequency of headaches has decreased. This was one month ago. She does state prior to this the headaches have only occurred once during the last visit (one month ago)  Was also given RX for sumatriptan 50 mg QD for abruption of headaches.      ROS: Per HPI  Current Outpatient Medications:    aspirin-acetaminophen-caffeine (EXCEDRIN MIGRAINE) 250-250-65 MG tablet, Take 1 tablet by mouth every 6 (six) hours as needed for headache., Disp: , Rfl:    cetirizine (ZYRTEC) 10 MG tablet, Take 10 mg by mouth daily as needed for allergies., Disp: , Rfl:    Cholecalciferol (VITAMIN D3 PO), Vitamin D, Disp: , Rfl:    fluticasone (FLONASE) 50 MCG/ACT nasal spray, Place 2 sprays into both nostrils daily., Disp: 16 g, Rfl: 0   metFORMIN (GLUCOPHAGE) 500 MG tablet, Take 1 tablet (500 mg total) by mouth 2 (two) times daily with a meal., Disp: 60 tablet, Rfl: 5   SUMAtriptan (IMITREX) 50 MG tablet, Take 1 tablet (50 mg total) by mouth every 2 (two) hours as needed for migraine. May repeat in 2 hours if headache persists or recurs., Disp: 10 tablet, Rfl: 0   topiramate (TOPAMAX) 50 MG tablet, Take 0.5 tablets (25 mg total) by mouth daily for 7  days, THEN 0.5 tablets (25 mg total) 2 (two) times daily for 7 days. Then 0.5 tablet qam (25 mg) and one tablet (50 mg) qhs for seven days,  then increase to 50 mg twice daily.., Disp: 56 tablet, Rfl: 0   vitamin B-12 (CYANOCOBALAMIN) 500 MCG tablet, Take 500 mcg by mouth daily., Disp: , Rfl:   Observations/Objective: Physical Exam Constitutional:      General: She is not in acute distress.    Appearance: Normal appearance. She is not ill-appearing.  Pulmonary:     Effort: Pulmonary effort is normal.  Neurological:     General: No focal deficit present.     Mental Status: She is alert and oriented to person, place, and time.  Psychiatric:        Mood and Affect: Mood normal.        Behavior: Behavior normal.        Thought Content: Thought content normal.     Assessment and Plan: Migraine without aura and responsive to treatment Assessment & Plan: Improvement with topamax. Continue 50 mg once daily.  Sumatriptan prn       Follow Up Instructions: Return f/u for CPE sometime after october.   I discussed the assessment and treatment plan with the patient. The patient was provided an opportunity to ask questions and all were answered. The patient agreed with the plan and demonstrated an understanding of the instructions.  The patient was advised to call back or seek an in-person evaluation if the symptoms worsen or if the condition fails to improve as anticipated.  The above assessment and management plan was discussed with the patient. The patient verbalized understanding of and has agreed to the management plan. Patient is aware to call the clinic if symptoms persist or worsen. Patient is aware when to return to the clinic for a follow-up visit. Patient educated on when it is appropriate to go to the emergency department.      Mort Sawyers, MSN, APRN, FNP-C [facility]

## 2022-07-21 NOTE — Progress Notes (Signed)
Obstetrics and Gynecology Annual Patient Evaluation  Appointment Date: 07/21/2022  OBGYN Clinic: [facility]  Primary Care Provider: Mort Sawyers  Chief Complaint:  Chief Complaint  Patient presents with   Gynecologic Exam    History of Present Illness: Marilyn Wilkinson is a 42 y.o. [phone] (Patient's last menstrual period was 12/12/2018.), seen for the above chief complaint. Her past medical history is significant for h/o TLH/BS   No issues or problems currently. No overt menopausal s/s, pt does feel hot at home when others don't.   Review of Systems: Pertinent items noted in HPI and remainder of comprehensive ROS otherwise negative.   Patient Active Problem List   Diagnosis Date Noted   Frequent headaches 06/23/2022   Arthralgia 06/19/2022   Menopausal and female climacteric states 06/19/2022   Anxiety state 09/16/2021   Family history of rheumatoid arthritis 09/16/2021   Right bundle branch block (RBBB) on electrocardiogram (ECG) 08/22/2021   Morbid obesity (HCC) 08/22/2021   Endometriosis 02/06/2021   Migraine without aura and responsive to treatment 02/06/2021   Alpha thalassemia trait 07/25/2019   Microcytosis 02/03/2019   IDA (iron deficiency anemia) 02/03/2019   Vitamin D deficiency 01/24/2019   BMI 45.0-49.9, adult (HCC) 05/25/2017   PCOS (polycystic ovarian syndrome) 03/03/2011     Past Medical History:  Past Medical History:  Diagnosis Date   Anemia    pt. told to use iron but she admits that she is not consistent in taking     Anxiety    Depression    Fallopian tube disorder    BLOCKAGE ON BILATERAL TUBE   Headache(784.0)    migraines   Hypertension    pt. reports that BP flucuates, has never had any treatment for     In vitro fertilization    Infertility of tubal origin    Polycystic ovarian syndrome    Positive ANA (antinuclear antibody)    Right tubal pregnancy without intrauterine pregnancy 06/19/2017   05/29/17:  single dose mtx given   Sleep apnea 2004   has lost weight & no longer having problems in this  area    Small bowel intussusception (HCC) 05/23/2021   Status post hysterectomy 12/29/2018   Transient hypertension 10/14/2018    Past Surgical History:  Past Surgical History:  Procedure Laterality Date   CESAREAN SECTION  06/26/2011   Procedure: CESAREAN SECTION;  Surgeon: Catalina Antigua, MD;  Location: [facility];  Service: Gynecology;  Laterality: N/A;   CYSTOSCOPY N/A 12/29/2018   Procedure: CYSTOSCOPY;  Surgeon: Togiak Bing, MD;  Location: [facility] OR;  Service: Gynecology;  Laterality: N/A;   DIAGNOSTIC LAPAROSCOPY  07/24/2010   REMOVAL OF BILATERAL TUBES BLOCKAGE   GASTRIC BYPASS  2004   TOTAL LAPAROSCOPIC HYSTERECTOMY WITH SALPINGECTOMY Right 12/29/2018   Procedure: TOTAL LAPAROSCOPIC HYSTERECTOMY WITH RIGHT SALPINGECTOMY;  Surgeon: Colony Bing, MD;  Location: [facility] OR;  Service: Gynecology;  Laterality: Right;    Past Obstetrical History:  OB History  Gravida Para Term Preterm AB Living  2 1 0 1 1 2   SAB IAB Ectopic Multiple Live Births  0 0 1 1 2     # Outcome Date GA Lbr Len/2nd Weight Sex Delivery Anes PTL Lv  2 Ectopic 06/2017          1A Preterm 06/26/11 [redacted]w[redacted]d  1 lb 15 oz (0.88 kg) F CS-LTranv Spinal  LIV  1B Preterm 06/26/11 [redacted]w[redacted]d  1 lb 6.2 oz (0.63 kg) F CS-LTranv Spinal  LIV  Obstetric Comments  06/2017: rt tubal ectopic. MTx used    Past Gynecological History: As per HPI. History of Pap Smear(s): Pap cytology negative 2017; 12/2018 TLH cervical pathology negative.  History of HRT use: No.  Social History:  Social History   Socioeconomic History   Marital status: Married    Spouse name: Not on file   Number of children: 2   Years of education: Not on file   Highest education level: Not on file  Occupational History   Occupation: home maker  Tobacco Use   Smoking status: Never   Smokeless tobacco: Never  Vaping Use   Vaping Use: Never used  Substance  and Sexual Activity   Alcohol use: Yes    Comment: socially   Drug use: No   Sexual activity: Yes    Partners: Male    Birth control/protection: Surgical    Comment: Married  Other Topics Concern   Not on file  Social History Narrative   Two twin girls   Social Determinants of Health   Financial Resource Strain: Not on file  Food Insecurity: Not on file  Transportation Needs: Not on file  Physical Activity: Not on file  Stress: Not on file  Social Connections: Not on file  Intimate Partner Violence: Not on file    Family History:  Family History  Problem Relation Age of Onset   Asthma Mother    Diabetes Mother    Arthritis Mother    Fibromyalgia Mother    COPD Mother        passed at age 15 y/o, hypoxia   Heart disease Mother    Hypertension Father    Diabetes Father    Kidney disease Father    Hypertension Maternal Grandmother    Arthritis Maternal Grandmother    Stroke Maternal Grandmother    Heart disease Maternal Grandfather    Heart defect Daughter     Medications [name] had no medications administered during this visit. Current Outpatient Medications  Medication Sig Dispense Refill   aspirin-acetaminophen-caffeine (EXCEDRIN MIGRAINE) 250-250-65 MG tablet Take 1 tablet by mouth every 6 (six) hours as needed for headache.     cetirizine (ZYRTEC) 10 MG tablet Take 10 mg by mouth daily as needed for allergies.     Cholecalciferol (VITAMIN D3 PO) Vitamin D     fluticasone (FLONASE) 50 MCG/ACT nasal spray Place 2 sprays into both nostrils daily. 16 g 0   metFORMIN (GLUCOPHAGE) 500 MG tablet Take 1 tablet (500 mg total) by mouth 2 (two) times daily with a meal. 60 tablet 5   SUMAtriptan (IMITREX) 50 MG tablet TAKE 1 TAB EVERY 2 HOURS AS NEEDED FOR MIGRAINE. MAY REPEAT IN 2 HRS IF HEADACHE PERSISTS OR RECURS. 10 tablet 0   topiramate (TOPAMAX) 50 MG tablet Take 1 tablet (50 mg total) by mouth daily. 90 tablet 1   vitamin B-12 (CYANOCOBALAMIN) 500 MCG  tablet Take 500 mcg by mouth daily.     No current facility-administered medications for this visit.    Allergies Shellfish allergy   Physical Exam:  BP 119/89   Pulse 96   Ht 5\' 6"  (1.676 m)   Wt 270 lb (122.5 kg)   LMP 12/12/2018   BMI 43.58 kg/m  Body mass index is 43.58 kg/m.  General appearance: Well nourished, well developed female in no acute distress.  Neck:  Supple, normal appearance, and no thyromegaly  Cardiovascular: normal s1 and s2.  No murmurs, rubs or gallops. Respiratory:  Clear to auscultation bilateral. Normal respiratory effort Abdomen: positive bowel sounds and no masses, hernias; diffusely non tender to palpation, non distended Breasts: breasts appear normal, no suspicious masses, no skin or nipple changes or axillary nodes, and normal palpation. Neuro/Psych:  Normal mood and affect.  Skin:  Warm and dry.  Lymphatic:  No inguinal lymphadenopathy.   Pelvic exam performed in the presence of a chaperone Pelvic exam: is limited by body habitus EGBUS: within normal limits Vagina: within normal limits and with no blood or discharge in the vault Cervix: surgically absent Cuff: wnl Bimanual: negative  Laboratory: none  Radiology: none  Assessment: patient doing well  Plan:  1. Breast cancer screening by mammogram - MM 3D SCREENING MAMMOGRAM BILATERAL BREAST; Future  2. Well Woman Exam Routine care.   RTC PRN  Return if symptoms worsen or fail to improve.  Future Appointments  Date Time Provider Department Center  08/22/2022  9:40 AM [facility] MM 3 [facility]    Cornelia Copa MD Attending [facility])

## 2022-07-21 NOTE — Progress Notes (Signed)
Patient presents for Annual.  Last pap:  10/12/2015  2020:Total laparoscopic hysterectomy, right salpingectomy, cystoscopy via fluorescein  Mammogram:  05/13/2021  Needs done at [facility]. No Family Hx of Breast Cancer. STD Screening: Declines Flu Vaccine : N/A  CC: Annual/None

## 2022-07-21 NOTE — Assessment & Plan Note (Signed)
Improvement with topamax. Continue 50 mg once daily.  Sumatriptan prn

## 2022-08-22 ENCOUNTER — Ambulatory Visit: Payer: 59

## 2022-09-24 ENCOUNTER — Ambulatory Visit
Admission: RE | Admit: 2022-09-24 | Discharge: 2022-09-24 | Disposition: A | Discharge status: Home / Self Care | Payer: 59 | Source: Ambulatory Visit | Attending: Obstetrics and Gynecology | Admitting: Obstetrics and Gynecology

## 2022-09-24 DIAGNOSIS — Z1231 Encounter for screening mammogram for malignant neoplasm of breast: Secondary | ICD-10-CM

## 2022-09-26 ENCOUNTER — Ambulatory Visit: Payer: 59

## 2022-11-03 ENCOUNTER — Ambulatory Visit (INDEPENDENT_AMBULATORY_CARE_PROVIDER_SITE_OTHER): Payer: 59 | Admitting: Family Medicine

## 2022-11-03 ENCOUNTER — Encounter: Payer: Self-pay | Admitting: Family Medicine

## 2022-11-03 VITALS — BP 128/76 | HR 95 | Temp 98.1°F | Ht 66.0 in | Wt 280.0 lb

## 2022-11-03 DIAGNOSIS — R519 Headache, unspecified: Secondary | ICD-10-CM | POA: Diagnosis not present

## 2022-11-03 DIAGNOSIS — J301 Allergic rhinitis due to pollen: Secondary | ICD-10-CM | POA: Insufficient documentation

## 2022-11-03 MED ORDER — MONTELUKAST SODIUM 10 MG PO TABS: 10.0000 mg | ORAL_TABLET | Freq: Every day | ORAL | 3 refills | Status: DC

## 2022-11-03 NOTE — Assessment & Plan Note (Signed)
Acute worsening, most likely secondary to allergic sinusitis, less likely worsening migraine off preventative.

## 2022-11-03 NOTE — Patient Instructions (Addendum)
Start Xyzal at bedtime.  Start Flonase 2 spray per nostril daily at night.  If not improving in 1-2 weeks... fill prescription for Singulair.  Follow up with Marilyn Wilkinson if not improving for possible migraine prevention treatment.

## 2022-11-03 NOTE — Progress Notes (Signed)
Patient ID: FELECHIA FITZ, female    DOB: Mar 04, 1980, 42 y.o.   MRN: [id number]  This visit was conducted in person.  BP 128/76   Pulse 95   Temp 98.1 F (36.7 C) (Temporal)   Ht 5\' 6"  (1.676 m)   Wt 280 lb (127 kg)   LMP 12/12/2018   SpO2 97%   BMI 45.19 kg/m    CC:  Chief Complaint  Patient presents with   Headache    Headaches 3-4 times a week,  Pt suspects this Is sinus related, she feels a lot of sinus pressure around temples and nostrils.     Subjective:   HPI: ZABELLE LOISEL is a 42 y.o. female patient of Mort Sawyers with history of migraine presenting on 11/03/2022 for Headache (Headaches 3-4 times a week, /Pt suspects this Is sinus related, she feels a lot of sinus pressure around temples and nostrils. )  History of migraine... treated prophylactically with Topamax 50 mg once daily ( she has stopped this given it caused dizziness).  Uses sumatriptan as needed for acute.   She feels like recent increase in facial pressure, sinus pressure in  last month... HA  occurring 3-4 times a week.  Nasal congestion, sneeze.  No fever.  Zyrtec not helping.  Uses Excedrin.       Relevant past medical, surgical, family and social history reviewed and updated as indicated. Interim medical history since our last visit reviewed. Allergies and medications reviewed and updated. Outpatient Medications Prior to Visit  Medication Sig Dispense Refill   aspirin-acetaminophen-caffeine (EXCEDRIN MIGRAINE) 250-250-65 MG tablet Take 1 tablet by mouth every 6 (six) hours as needed for headache.     cetirizine (ZYRTEC) 10 MG tablet Take 10 mg by mouth daily as needed for allergies.     Cholecalciferol (VITAMIN D3 PO) Vitamin D     SUMAtriptan (IMITREX) 50 MG tablet TAKE 1 TAB EVERY 2 HOURS AS NEEDED FOR MIGRAINE. MAY REPEAT IN 2 HRS IF HEADACHE PERSISTS OR RECURS. 10 tablet 0   topiramate (TOPAMAX) 50 MG tablet Take 1 tablet (50 mg total) by mouth daily. 90 tablet 1   vitamin B-12  (CYANOCOBALAMIN) 500 MCG tablet Take 500 mcg by mouth daily.     fluticasone (FLONASE) 50 MCG/ACT nasal spray Place 2 sprays into both nostrils daily. (Patient not taking: Reported on 11/03/2022) 16 g 0   metFORMIN (GLUCOPHAGE) 500 MG tablet Take 1 tablet (500 mg total) by mouth 2 (two) times daily with a meal. (Patient not taking: Reported on 11/03/2022) 60 tablet 5   No facility-administered medications prior to visit.     Per HPI unless specifically indicated in ROS section below Review of Systems  Constitutional:  Negative for fatigue and fever.  HENT:  Negative for congestion.   Eyes:  Negative for pain.  Respiratory:  Negative for cough and shortness of breath.   Cardiovascular:  Negative for chest pain, palpitations and leg swelling.  Gastrointestinal:  Negative for abdominal pain.  Genitourinary:  Negative for dysuria and vaginal bleeding.  Musculoskeletal:  Negative for back pain.  Neurological:  Negative for syncope, light-headedness and headaches.  Psychiatric/Behavioral:  Negative for dysphoric mood.    Objective:  BP 128/76   Pulse 95   Temp 98.1 F (36.7 C) (Temporal)   Ht 5\' 6"  (1.676 m)   Wt 280 lb (127 kg)   LMP 12/12/2018   SpO2 97%   BMI 45.19 kg/m   Wt Readings from Last 3  Encounters:  11/03/22 280 lb (127 kg)  07/21/22 270 lb (122.5 kg)  07/21/22 268 lb (121.6 kg)      Physical Exam Constitutional:      General: She is not in acute distress.    Appearance: Normal appearance. She is well-developed. She is not ill-appearing or toxic-appearing.  HENT:     Head: Normocephalic.     Right Ear: Hearing, tympanic membrane, ear canal and external ear normal. Tympanic membrane is not erythematous, retracted or bulging.     Left Ear: Hearing, tympanic membrane, ear canal and external ear normal. Tympanic membrane is not erythematous, retracted or bulging.     Nose: No mucosal edema or rhinorrhea.     Right Turbinates: Swollen and pale.     Left Turbinates:  Enlarged, swollen and pale.     Right Sinus: Maxillary sinus tenderness present. No frontal sinus tenderness.     Left Sinus: Maxillary sinus tenderness present. No frontal sinus tenderness.     Mouth/Throat:     Mouth: Oropharynx is clear and moist and mucous membranes are normal.     Pharynx: Uvula midline.  Eyes:     General: Lids are normal. Lids are everted, no foreign bodies appreciated.     Extraocular Movements: EOM normal.     Conjunctiva/sclera: Conjunctivae normal.     Pupils: Pupils are equal, round, and reactive to light.  Neck:     Thyroid: No thyroid mass or thyromegaly.     Vascular: No carotid bruit.     Trachea: Trachea normal.  Cardiovascular:     Rate and Rhythm: Normal rate and regular rhythm.     Pulses: Normal pulses.     Heart sounds: Normal heart sounds, S1 normal and S2 normal. No murmur heard.    No friction rub. No gallop.  Pulmonary:     Effort: Pulmonary effort is normal. No tachypnea or respiratory distress.     Breath sounds: Normal breath sounds. No decreased breath sounds, wheezing, rhonchi or rales.  Abdominal:     General: Bowel sounds are normal.     Palpations: Abdomen is soft.     Tenderness: There is no abdominal tenderness.  Musculoskeletal:     Cervical back: Normal range of motion and neck supple.  Skin:    General: Skin is warm, dry and intact.     Findings: No rash.  Neurological:     Mental Status: She is alert.  Psychiatric:        Mood and Affect: Mood is not anxious or depressed.        Speech: Speech normal.        Behavior: Behavior normal. Behavior is cooperative.        Thought Content: Thought content normal.        Cognition and Memory: Cognition and memory normal.        Judgment: Judgment normal.       Results for orders placed or performed during the hospital encounter of 03/18/22  SARS CORONAVIRUS 2 (TAT 6-24 HRS) Anterior Nasal Swab   Specimen: Anterior Nasal Swab  Result Value Ref Range   SARS Coronavirus 2  NEGATIVE NEGATIVE  Culture, group A strep   Specimen: Throat  Result Value Ref Range   Specimen Description THROAT    Special Requests NONE    Culture      NO GROUP A STREP (S.PYOGENES) ISOLATED Performed at [facility], [address], [place], [place] [address]    Report Status  03/20/2022 FINAL   POCT rapid strep A  Result Value Ref Range   Rapid Strep A Screen Negative Negative    Assessment and Plan  Seasonal allergic rhinitis due to pollen Assessment & Plan: Acute worsening, likely worsening headaches. Will treat with Flonase 2 sprays per nostril daily, change Zyrtec to Xyzal at bedtime. Consider nasal saline irrigation. If not improving over the next 1 to 2 weeks she can add Singulair to her regimen 10 mg p.o. nightly.  Symptoms not terribly consistent with her migraines but given she has stopped Topamax she may want to follow-up with her PCP to consider other migraine preventative.  Return and ER precautions provided   Frequent headaches Assessment & Plan: Acute worsening, most likely secondary to allergic sinusitis, less likely worsening migraine off preventative.   Other orders -     Montelukast Sodium; Take 1 tablet (10 mg total) by mouth at bedtime.  Dispense: 30 tablet; Refill: 3    No follow-ups on file.   Kerby Nora, MD

## 2022-11-03 NOTE — Assessment & Plan Note (Signed)
Acute worsening, likely worsening headaches. Will treat with Flonase 2 sprays per nostril daily, change Zyrtec to Xyzal at bedtime. Consider nasal saline irrigation. If not improving over the next 1 to 2 weeks she can add Singulair to her regimen 10 mg p.o. nightly.  Symptoms not terribly consistent with her migraines but given she has stopped Topamax she may want to follow-up with her PCP to consider other migraine preventative.  Return and ER precautions provided

## 2022-11-05 ENCOUNTER — Other Ambulatory Visit: Payer: Self-pay | Admitting: Family

## 2022-11-05 DIAGNOSIS — G43009 Migraine without aura, not intractable, without status migrainosus: Secondary | ICD-10-CM

## 2022-12-04 ENCOUNTER — Other Ambulatory Visit: Payer: Self-pay | Admitting: Family

## 2022-12-04 DIAGNOSIS — G43009 Migraine without aura, not intractable, without status migrainosus: Secondary | ICD-10-CM

## 2023-01-15 ENCOUNTER — Other Ambulatory Visit: Payer: Self-pay | Admitting: Family

## 2023-01-15 DIAGNOSIS — G43009 Migraine without aura, not intractable, without status migrainosus: Secondary | ICD-10-CM

## 2023-02-26 ENCOUNTER — Ambulatory Visit (INDEPENDENT_AMBULATORY_CARE_PROVIDER_SITE_OTHER): Payer: 59 | Admitting: Family

## 2023-02-26 ENCOUNTER — Encounter: Payer: Self-pay | Admitting: Family

## 2023-02-26 ENCOUNTER — Other Ambulatory Visit: Payer: Self-pay | Admitting: Family

## 2023-02-26 DIAGNOSIS — Z9189 Other specified personal risk factors, not elsewhere classified: Secondary | ICD-10-CM

## 2023-02-26 DIAGNOSIS — R17 Unspecified jaundice: Secondary | ICD-10-CM | POA: Diagnosis not present

## 2023-02-26 DIAGNOSIS — E559 Vitamin D deficiency, unspecified: Secondary | ICD-10-CM | POA: Diagnosis not present

## 2023-02-26 DIAGNOSIS — M2550 Pain in unspecified joint: Secondary | ICD-10-CM | POA: Diagnosis not present

## 2023-02-26 DIAGNOSIS — Z79899 Other long term (current) drug therapy: Secondary | ICD-10-CM

## 2023-02-26 DIAGNOSIS — E282 Polycystic ovarian syndrome: Secondary | ICD-10-CM

## 2023-02-26 DIAGNOSIS — D500 Iron deficiency anemia secondary to blood loss (chronic): Secondary | ICD-10-CM

## 2023-02-26 DIAGNOSIS — R7303 Prediabetes: Secondary | ICD-10-CM

## 2023-02-26 DIAGNOSIS — Z8261 Family history of arthritis: Secondary | ICD-10-CM

## 2023-02-26 DIAGNOSIS — L659 Nonscarring hair loss, unspecified: Secondary | ICD-10-CM

## 2023-02-26 DIAGNOSIS — D563 Thalassemia minor: Secondary | ICD-10-CM

## 2023-02-26 DIAGNOSIS — R635 Abnormal weight gain: Secondary | ICD-10-CM

## 2023-02-26 DIAGNOSIS — R768 Other specified abnormal immunological findings in serum: Secondary | ICD-10-CM | POA: Diagnosis not present

## 2023-02-26 DIAGNOSIS — M792 Neuralgia and neuritis, unspecified: Secondary | ICD-10-CM | POA: Diagnosis not present

## 2023-02-26 DIAGNOSIS — L409 Psoriasis, unspecified: Secondary | ICD-10-CM | POA: Insufficient documentation

## 2023-02-26 LAB — CBC
HCT: 42.3 % (ref 36.0–46.0)
Hemoglobin: 13.1 g/dL (ref 12.0–15.0)
MCHC: 31.1 g/dL (ref 30.0–36.0)
MCV: 75.6 fL — ABNORMAL LOW (ref 78.0–100.0)
Platelets: 276 10*3/uL (ref 150.0–400.0)
RBC: 5.59 Mil/uL — ABNORMAL HIGH (ref 3.87–5.11)
RDW: 16.4 % — ABNORMAL HIGH (ref 11.5–15.5)
WBC: 7.5 10*3/uL (ref 4.0–10.5)

## 2023-02-26 LAB — TSH: TSH: 1.78 u[IU]/mL (ref 0.35–5.50)

## 2023-02-26 LAB — LIPID PANEL
Cholesterol: 149 mg/dL (ref 0–200)
HDL: 61.6 mg/dL (ref >39.00)
LDL Cholesterol: 72 mg/dL (ref 0–99)
NonHDL: 87.35
Total CHOL/HDL Ratio: 2
Triglycerides: 75 mg/dL (ref 0.0–149.0)
VLDL: 15 mg/dL (ref 0.0–40.0)

## 2023-02-26 LAB — COMPREHENSIVE METABOLIC PANEL
ALT: 17 U/L (ref 0–35)
AST: 17 U/L (ref 0–37)
Albumin: 4.3 g/dL (ref 3.5–5.2)
Alkaline Phosphatase: 72 U/L (ref 39–117)
BUN: 14 mg/dL (ref 6–23)
CO2: 28 meq/L (ref 19–32)
Calcium: 9 mg/dL (ref 8.4–10.5)
Chloride: 104 meq/L (ref 96–112)
Creatinine, Ser: 0.7 mg/dL (ref 0.40–1.20)
GFR: 106.91 mL/min (ref >60.00)
Glucose, Bld: 84 mg/dL (ref 70–99)
Potassium: 4.7 meq/L (ref 3.5–5.1)
Sodium: 138 meq/L (ref 135–145)
Total Bilirubin: 0.6 mg/dL (ref 0.2–1.2)
Total Protein: 6.9 g/dL (ref 6.0–8.3)

## 2023-02-26 LAB — HEMOGLOBIN A1C: Hgb A1c MFr Bld: 5.7 % (ref 4.6–6.5)

## 2023-02-26 LAB — T4, FREE: Free T4: 0.95 ng/dL (ref 0.60–1.60)

## 2023-02-26 LAB — VITAMIN D 25 HYDROXY (VIT D DEFICIENCY, FRACTURES): VITD: 15.49 ng/mL — ABNORMAL LOW (ref 30.00–100.00)

## 2023-02-26 LAB — VITAMIN B12: Vitamin B-12: 348 pg/mL (ref 211–911)

## 2023-02-26 MED ORDER — CLOBETASOL PROPIONATE 0.05 % EX SOLN: 1.0000 | Freq: Two times a day (BID) | CUTANEOUS | 0 refills | Status: DC

## 2023-02-26 MED ORDER — ZEPBOUND 5 MG/0.5ML ~~LOC~~ SOAJ: 5.0000 mg | SUBCUTANEOUS | 0 refills | Status: DC

## 2023-02-26 MED ORDER — CHOLECALCIFEROL 1.25 MG (50000 UT) PO TABS: 1.0000 | ORAL_TABLET | ORAL | 0 refills | Status: DC

## 2023-02-26 MED ORDER — ZEPBOUND 2.5 MG/0.5ML ~~LOC~~ SOAJ: 2.5000 mg | SUBCUTANEOUS | 0 refills | Status: DC

## 2023-02-26 NOTE — Assessment & Plan Note (Signed)
The beneficiary does not have any FDA labeled contraindications to the requested agent including pregnancy, lactation, h/o medullary thyroid cancer or multiple endocrine neoplasia type II.   Trial zepbound.  Pt advised to work on diet and exercise as tolerated

## 2023-02-26 NOTE — Progress Notes (Signed)
Established Patient Office Visit  Subjective:   Patient ID: Marilyn Wilkinson, female    DOB: July 21, 1980  Age: 43 y.o. MRN: [id number]  CC:  Chief Complaint  Patient presents with   Medical Management of Chronic Issues    Wants to discuss recent weight gain and possibly starting Zepbound for weight loss.    HPI: Marilyn Wilkinson is a 43 y.o. female presenting on 02/26/2023 for Medical Management of Chronic Issues (Wants to discuss recent weight gain and possibly starting Zepbound for weight loss.)  08/2021 with positive ANA. She was recommended to see a rheumatologist but did not follow through but has bene noticing multiple joint pains since and she is looking to revisit that referral. She has had long term right plantar fascitis, which had improved slightly but then came back and now also has similar symptoms in the left foot. She has noticing worsening when she started gaining weight again. She states there is a burning sensation on the heels of her feet, intermittent but mostly constant. If she is being more active they hurt even worse. Hurts also when she is walking. She also has right elbow tenderness, bumped it maybe a few months ago but still with pain. She does have slight bil hand stiffness in the am when she awakes, works out in less than 1 hour.   Does have fmh of autoimmune disease. Mom with RA and myasthenia gravis, and MGM with RA  Obesity, has gained about 21 pounds since last June 2024. She has a lot of pain right now so it is hard for her to exercise the way that she wants to and she is struggling. She at times will try to focus on her diet, but she will do well one day and others days will not.   Wt Readings from Last 3 Encounters:  02/26/23 291 lb 12.8 oz (132.4 kg)  11/03/22 280 lb (127 kg)  07/21/22 270 lb (122.5 kg)          ROS: Negative unless specifically indicated above in HPI.   Relevant past medical history reviewed and updated as indicated.   Allergies and  medications reviewed and updated.   Current Outpatient Medications:    aspirin-acetaminophen-caffeine (EXCEDRIN MIGRAINE) 250-250-65 MG tablet, Take 1 tablet by mouth every 6 (six) hours as needed for headache., Disp: , Rfl:    cetirizine (ZYRTEC) 10 MG tablet, Take 10 mg by mouth daily as needed for allergies., Disp: , Rfl:    Cholecalciferol (VITAMIN D3 PO), Vitamin D, Disp: , Rfl:    clobetasol (TEMOVATE) 0.05 % external solution, Apply 1 Application topically 2 (two) times daily., Disp: 50 mL, Rfl: 0   montelukast (SINGULAIR) 10 MG tablet, Take 1 tablet (10 mg total) by mouth at bedtime., Disp: 30 tablet, Rfl: 3   SUMAtriptan (IMITREX) 50 MG tablet, TAKE 1 TAB EVERY 2 HOURS AS NEEDED FOR MIGRAINE. MAY REPEAT IN 2 HRS IF HEADACHE PERSISTS OR RECURS., Disp: 10 tablet, Rfl: 0   tirzepatide (ZEPBOUND) 2.5 MG/0.5ML Pen, Inject 2.5 mg into the skin once a week. Once complete with four weeks of weekly injection will increase to 5 mg Qweek with new RX, Disp: 2 mL, Rfl: 0   tirzepatide (ZEPBOUND) 5 MG/0.5ML Pen, Inject 5 mg into the skin once a week. After four weeks of 2.5 mg weekly increase to 5 mg once weekly, Disp: 2 mL, Rfl: 0   topiramate (TOPAMAX) 50 MG tablet, Take 1 tablet (50 mg total) by mouth daily.,  Disp: 90 tablet, Rfl: 1   vitamin B-12 (CYANOCOBALAMIN) 500 MCG tablet, Take 500 mcg by mouth daily., Disp: , Rfl:   Allergies  Allergen Reactions   Shellfish Allergy Hives    JUST SHRIMP    Objective:   BP 134/88 (BP Location: Left Arm, Patient Position: Sitting, Cuff Size: Large)   Pulse 94   Temp 98.3 F (36.8 C) (Temporal)   Ht 5\' 6"  (1.676 m)   Wt 291 lb 12.8 oz (132.4 kg)   LMP 12/12/2018   SpO2 98%   BMI 47.10 kg/m    Physical Exam Constitutional:      General: She is not in acute distress.    Appearance: Normal appearance. She is obese. She is not ill-appearing, toxic-appearing or diaphoretic.  HENT:     Head: Normocephalic.  Cardiovascular:     Rate and Rhythm:  Normal rate.  Pulmonary:     Effort: Pulmonary effort is normal.  Musculoskeletal:        General: Normal range of motion.     Right foot: Normal. Normal range of motion. No tenderness.     Left foot: Normal. Normal range of motion. No tenderness.  Skin:    Comments: Scalp dermatitis with small scaly areas of erythema with silver  Neurological:     General: No focal deficit present.     Mental Status: She is alert and oriented to person, place, and time. Mental status is at baseline.  Psychiatric:        Mood and Affect: Mood normal.        Behavior: Behavior normal.        Thought Content: Thought content normal.        Judgment: Judgment normal.     Assessment & Plan:  Morbid obesity (HCC) Assessment & Plan: The beneficiary does not have any FDA labeled contraindications to the requested agent including pregnancy, lactation, h/o medullary thyroid cancer or multiple endocrine neoplasia type II.   Trial zepbound.  Pt advised to work on diet and exercise as tolerated   Orders: -     Zepbound; Inject 5 mg into the skin once a week. After four weeks of 2.5 mg weekly increase to 5 mg once weekly  Dispense: 2 mL; Refill: 0 -     Zepbound; Inject 2.5 mg into the skin once a week. Once complete with four weeks of weekly injection will increase to 5 mg Qweek with new RX  Dispense: 2 mL; Refill: 0 -     Hemoglobin A1c -     TSH -     T4, free -     Lipid panel  Iron deficiency anemia due to chronic blood loss -     CBC -     Vitamin B12  Vitamin D deficiency -     VITAMIN D 25 Hydroxy (Vit-D Deficiency, Fractures)  Alpha thalassemia trait  PCOS (polycystic ovarian syndrome) -     Zepbound; Inject 5 mg into the skin once a week. After four weeks of 2.5 mg weekly increase to 5 mg once weekly  Dispense: 2 mL; Refill: 0 -     Zepbound; Inject 2.5 mg into the skin once a week. Once complete with four weeks of weekly injection will increase to 5 mg Qweek with new RX  Dispense: 2 mL;  Refill: 0 -     Hemoglobin A1c  Family history of rheumatoid arthritis -     Ambulatory referral to Rheumatology  Positive ANA (antinuclear antibody)  Assessment & Plan: Repeat today in office Pending results Referral placed to dermatology for worsening polyarthralgia and family history   Orders: -     Ambulatory referral to Rheumatology -     ANA Screen,IFA,Reflex Titer/Pattern,Reflex Mplx 11 Ab Cascade with IdentRA  Polyarthralgia -     Ambulatory referral to Rheumatology -     ANA Screen,IFA,Reflex Titer/Pattern,Reflex Mplx 11 Ab Cascade with IdentRA  At risk for diabetes mellitus -     Zepbound; Inject 5 mg into the skin once a week. After four weeks of 2.5 mg weekly increase to 5 mg once weekly  Dispense: 2 mL; Refill: 0 -     Zepbound; Inject 2.5 mg into the skin once a week. Once complete with four weeks of weekly injection will increase to 5 mg Qweek with new RX  Dispense: 2 mL; Refill: 0 -     Hemoglobin A1c  Elevated bilirubin -     Comprehensive metabolic panel  Abnormal weight gain -     TSH -     T4, free  On statin therapy -     Lipid panel  Neuralgia -     Vitamin B12  Scalp psoriasis Assessment & Plan: Trial clobetasol solution   Orders: -     Clobetasol Propionate; Apply 1 Application topically 2 (two) times daily.  Dispense: 50 mL; Refill: 0 -     Ambulatory referral to Dermatology  Alopecia Assessment & Plan: May be aggravated by scalp dermatitis, treating.  Can take biotin, and or trial Rogaine. Referral placed to dermatology.    Orders: -     Ambulatory referral to Dermatology     Follow up plan: Return in about 3 months (around 05/27/2023) for f/u weight loss medication.  Mort Sawyers, FNP

## 2023-02-26 NOTE — Assessment & Plan Note (Signed)
Trial clobetasol solution

## 2023-02-26 NOTE — Telephone Encounter (Signed)
Can we get a prior auth going for zepbound please

## 2023-02-26 NOTE — Assessment & Plan Note (Signed)
May be aggravated by scalp dermatitis, treating.  Can take biotin, and or trial Rogaine. Referral placed to dermatology.

## 2023-02-26 NOTE — Assessment & Plan Note (Signed)
Repeat today in office Pending results Referral placed to dermatology for worsening polyarthralgia and family history

## 2023-02-27 MED ORDER — METFORMIN HCL 500 MG PO TABS: 500.0000 mg | ORAL_TABLET | Freq: Two times a day (BID) | ORAL | 3 refills | Status: DC

## 2023-03-02 ENCOUNTER — Encounter: Payer: Self-pay | Admitting: Family

## 2023-03-04 LAB — ANA SCREEN,IFA,REFLEX TITER/PATTERN,REFLEX MPLX 11 AB CASCADE
Anti Nuclear Antibody (ANA): POSITIVE — AB
Cyclic Citrullin Peptide Ab: <16 U
MUTATED CITRULLINATED VIMENTIN (MCV) AB: <20 U/mL (ref <20)
Rheumatoid fact SerPl-aCnc: <10 [IU]/mL (ref <14)

## 2023-03-04 LAB — TIER 1
Chromatin (Nucleosomal) Antibody: <1 AI
ENA SM Ab Ser-aCnc: <1 AI
Ribonucleic Protein(ENA) Antibody, IgG: <1 AI
SM/RNP: <1 AI
ds DNA Ab: 2 [IU]/mL

## 2023-03-04 LAB — TIER 3
Centromere Ab Screen: <1 AI
Ribosomal P Protein Ab: <1 AI

## 2023-03-04 LAB — TIER 2
Jo-1 Autoabs: <1 AI
SSA (Ro) (ENA) Antibody, IgG: <1 AI
SSB (La) (ENA) Antibody, IgG: <1 AI
Scleroderma (Scl-70) (ENA) Antibody, IgG: <1 AI

## 2023-03-04 LAB — INTERPRETATION

## 2023-03-04 LAB — ANTI-NUCLEAR AB-TITER (ANA TITER): ANA Titer 1: 1:40 {titer} — ABNORMAL HIGH

## 2023-03-09 ENCOUNTER — Encounter: Payer: Self-pay | Admitting: Oncology

## 2023-03-09 ENCOUNTER — Other Ambulatory Visit (HOSPITAL_COMMUNITY): Payer: Self-pay

## 2023-03-09 ENCOUNTER — Telehealth: Payer: Self-pay

## 2023-03-09 NOTE — Telephone Encounter (Signed)
Pharmacy Patient Advocate Encounter   Received notification from RX Request Messages that prior authorization for Zepbound 2.5MG /0.5ML pen-injectors is required/requested.   Insurance verification completed.   The patient is insured through [facility] .   Per test claim: PA required; PA started via CoverMyMeds. KEY [id number] . Waiting for clinical questions to populate.

## 2023-03-09 NOTE — Telephone Encounter (Signed)
Clinical questions have been answered and PA submitted. PA currently Pending.

## 2023-03-11 ENCOUNTER — Other Ambulatory Visit (HOSPITAL_COMMUNITY): Payer: Self-pay

## 2023-03-11 NOTE — Telephone Encounter (Signed)
Pharmacy Patient Advocate Encounter  Received notification from [facility] that Prior Authorization for Zepbound 2.5MG /0.5ML pen-injectors has been DENIED.  See denial reason below. No denial letter attached in CMM. Will attach denial letter to Media tab once received.   PA #/Case ID/Reference #: [id number]   DENIAL REASON: Plan exclusion

## 2023-03-12 ENCOUNTER — Encounter: Payer: Self-pay | Admitting: Family

## 2023-03-14 ENCOUNTER — Other Ambulatory Visit: Payer: Self-pay | Admitting: Family

## 2023-03-14 DIAGNOSIS — G43009 Migraine without aura, not intractable, without status migrainosus: Secondary | ICD-10-CM

## 2023-03-15 ENCOUNTER — Ambulatory Visit
Admission: EM | Admit: 2023-03-15 | Discharge: 2023-03-15 | Disposition: A | Discharge status: Home / Self Care | Payer: Commercial Managed Care - PPO | Attending: Family Medicine | Admitting: Family Medicine

## 2023-03-15 DIAGNOSIS — J111 Influenza due to unidentified influenza virus with other respiratory manifestations: Secondary | ICD-10-CM

## 2023-03-15 MED ORDER — PROMETHAZINE-DM 6.25-15 MG/5ML PO SYRP: 5.0000 mL | ORAL_SOLUTION | Freq: Three times a day (TID) | ORAL | 0 refills | Status: DC | PRN

## 2023-03-15 MED ORDER — OSELTAMIVIR PHOSPHATE 75 MG PO CAPS: 75.0000 mg | ORAL_CAPSULE | Freq: Two times a day (BID) | ORAL | 0 refills | Status: DC

## 2023-03-15 MED ORDER — PSEUDOEPHEDRINE HCL 60 MG PO TABS: 60.0000 mg | ORAL_TABLET | Freq: Three times a day (TID) | ORAL | 0 refills | Status: DC | PRN

## 2023-03-15 MED ORDER — IBUPROFEN 600 MG PO TABS: 600.0000 mg | ORAL_TABLET | Freq: Four times a day (QID) | ORAL | 0 refills | Status: DC | PRN

## 2023-03-15 MED ORDER — BENZONATATE 100 MG PO CAPS: 100.0000 mg | ORAL_CAPSULE | Freq: Three times a day (TID) | ORAL | 0 refills | Status: DC | PRN

## 2023-03-15 NOTE — ED Triage Notes (Signed)
Pt c/o cough, head/chest congestion, HA started yesterday-denies fever-reports +flu exposure in the home-NAD-steady gait

## 2023-03-15 NOTE — Discharge Instructions (Addendum)
We will manage this influenza infection with Tamiflu. For sore throat or cough try using a honey-based tea. Use 3 teaspoons of honey with juice squeezed from half lemon. Place shaved pieces of ginger into 1/2-1 cup of water and warm over stove top. Then mix the ingredients and repeat every 4 hours as needed. Please take ibuprofen 600mg  every 6 hours with food alternating with OR taken together with Tylenol 500mg -650mg  every 6 hours for throat pain, fevers, aches and pains. Hydrate very well with at least 2 liters of water. Eat light meals such as soups (chicken and noodles, vegetable, chicken and wild rice).  Do not eat foods that you are allergic to.  Taking an antihistamine like Zyrtec (10mg  daily) can help against postnasal drainage, sinus congestion which can cause sinus pain, sinus headaches, throat pain, painful swallowing, coughing.  You can take this together with pseudoephedrine (Sudafed) at a dose of 60 mg 3 times a day or twice daily as needed for the same kind of nasal drip, congestion.  Use cough syrup as needed.

## 2023-03-15 NOTE — ED Provider Notes (Signed)
[facility]  Note:  This document was prepared using Conservation officer, historic buildings and may include unintentional dictation errors.  MRN: [id number] DOB: 12-31-1980  Subjective:   Marilyn Wilkinson is a 43 y.o. female presenting for 1 day history of headaches, sinus and chest congestion, coughing, malaise and fatigue.  Her child tested positive for flu yesterday.  No history of asthma.  No overt chest pain, shortness of breath or wheezing.  No smoking of any kind including cigarettes, cigars, vaping, marijuana use.    No current facility-administered medications for this encounter.  Current Outpatient Medications:    aspirin-acetaminophen-caffeine (EXCEDRIN MIGRAINE) 250-250-65 MG tablet, Take 1 tablet by mouth every 6 (six) hours as needed for headache., Disp: , Rfl:    cetirizine (ZYRTEC) 10 MG tablet, Take 10 mg by mouth daily as needed for allergies., Disp: , Rfl:    Cholecalciferol 1.25 MG (50000 UT) TABS, Take 1 tablet by mouth once a week., Disp: 12 tablet, Rfl: 0   clobetasol (TEMOVATE) 0.05 % external solution, Apply 1 Application topically 2 (two) times daily., Disp: 50 mL, Rfl: 0   metFORMIN (GLUCOPHAGE) 500 MG tablet, Take 1 tablet (500 mg total) by mouth 2 (two) times daily with a meal., Disp: 180 tablet, Rfl: 3   montelukast (SINGULAIR) 10 MG tablet, Take 1 tablet (10 mg total) by mouth at bedtime., Disp: 30 tablet, Rfl: 3   SUMAtriptan (IMITREX) 50 MG tablet, TAKE 1 TAB EVERY 2 HOURS AS NEEDED FOR MIGRAINE. MAY REPEAT IN 2 HRS IF HEADACHE PERSISTS OR RECURS., Disp: 10 tablet, Rfl: 0   tirzepatide (ZEPBOUND) 2.5 MG/0.5ML Pen, INJECT 2.5 MG INTO THE SKIN ONCE A WEEK. ONCE COMPLETE WITH FOUR WEEKS OF WEEKLY INJECTION WILL INCREASE TO 5 MG QWEEK WITH NEW RX, Disp: 2 mL, Rfl: 0   [START ON 03/19/2023] tirzepatide (ZEPBOUND) 5 MG/0.5ML Pen, Inject 5 mg into the skin once a week. After four weeks of 2.5 mg weekly increase to 5 mg once weekly, Disp: 2 mL, Rfl: 0    topiramate (TOPAMAX) 50 MG tablet, Take 1 tablet (50 mg total) by mouth daily., Disp: 90 tablet, Rfl: 1   vitamin B-12 (CYANOCOBALAMIN) 500 MCG tablet, Take 500 mcg by mouth daily., Disp: , Rfl:    Allergies  Allergen Reactions   Shellfish Allergy Hives    JUST SHRIMP    Past Medical History:  Diagnosis Date   Anemia    pt. told to use iron but she admits that she is not consistent in taking     Anxiety    Depression    Fallopian tube disorder    BLOCKAGE ON BILATERAL TUBE   Headache(784.0)    migraines   Hypertension    pt. reports that BP flucuates, has never had any treatment for     In vitro fertilization    Infertility of tubal origin    Polycystic ovarian syndrome    Positive ANA (antinuclear antibody)    Right tubal pregnancy without intrauterine pregnancy 06/19/2017   05/29/17: single dose mtx given   Sleep apnea 2004   has lost weight & no longer having problems in this  area    Small bowel intussusception (HCC) 05/23/2021   Status post hysterectomy 12/29/2018   Transient hypertension 10/14/2018     Past Surgical History:  Procedure Laterality Date   CESAREAN SECTION  06/26/2011   Procedure: CESAREAN SECTION;  Surgeon: Catalina Antigua, MD;  Location: [facility];  Service: Gynecology;  Laterality:  N/A;   CYSTOSCOPY N/A 12/29/2018   Procedure: CYSTOSCOPY;  Surgeon: Atlanta Bing, MD;  Location: [facility] OR;  Service: Gynecology;  Laterality: N/A;   DIAGNOSTIC LAPAROSCOPY  07/24/2010   REMOVAL OF BILATERAL TUBES BLOCKAGE   GASTRIC BYPASS  2004   TOTAL LAPAROSCOPIC HYSTERECTOMY WITH SALPINGECTOMY Right 12/29/2018   Procedure: TOTAL LAPAROSCOPIC HYSTERECTOMY WITH RIGHT SALPINGECTOMY;  Surgeon: Wickes Bing, MD;  Location: [facility] OR;  Service: Gynecology;  Laterality: Right;    Family History  Problem Relation Age of Onset   Asthma Mother    Fibromyalgia Mother    COPD Mother        passed at age 54 y/o, hypoxia   Heart disease Mother    Rheum arthritis Mother     Myasthenia gravis Mother    Hypertension Father    Diabetes Father    Kidney disease Father    Hypertension Maternal Grandmother    Arthritis/Rheumatoid Maternal Grandmother    Stroke Maternal Grandmother    Heart disease Maternal Grandfather    Heart defect Daughter     Social History   Tobacco Use   Smoking status: Never   Smokeless tobacco: Never  Vaping Use   Vaping status: Never Used  Substance Use Topics   Alcohol use: Yes    Comment: socially   Drug use: No    ROS   Objective:   Vitals: BP 137/87 (BP Location: Left Arm)   Pulse 86   Temp 98.4 F (36.9 C) (Oral)   Resp 20   LMP 12/12/2018   SpO2 97%   Physical Exam Constitutional:      General: She is not in acute distress.    Appearance: Normal appearance. She is well-developed and normal weight. She is not ill-appearing, toxic-appearing or diaphoretic.  HENT:     Head: Normocephalic and atraumatic.     Right Ear: Tympanic membrane, ear canal and external ear normal. No drainage or tenderness. No middle ear effusion. There is no impacted cerumen. Tympanic membrane is not erythematous or bulging.     Left Ear: Tympanic membrane, ear canal and external ear normal. No drainage or tenderness.  No middle ear effusion. There is no impacted cerumen. Tympanic membrane is not erythematous or bulging.     Nose: Nose normal. No congestion or rhinorrhea.     Mouth/Throat:     Mouth: Mucous membranes are moist. No oral lesions.     Pharynx: No pharyngeal swelling, oropharyngeal exudate, posterior oropharyngeal erythema or uvula swelling.     Tonsils: No tonsillar exudate or tonsillar abscesses.  Eyes:     General: No scleral icterus.       Right eye: No discharge.        Left eye: No discharge.     Extraocular Movements: Extraocular movements intact.     Right eye: Normal extraocular motion.     Left eye: Normal extraocular motion.     Conjunctiva/sclera: Conjunctivae normal.  Cardiovascular:     Rate and Rhythm:  Normal rate and regular rhythm.     Heart sounds: Normal heart sounds. No murmur heard.    No friction rub. No gallop.  Pulmonary:     Effort: Pulmonary effort is normal. No respiratory distress.     Breath sounds: No stridor. No wheezing, rhonchi or rales.  Chest:     Chest wall: No tenderness.  Musculoskeletal:     Cervical back: Normal range of motion and neck supple.  Lymphadenopathy:     Cervical: No cervical adenopathy.  Skin:    General: Skin is warm and dry.  Neurological:     General: No focal deficit present.     Mental Status: She is alert and oriented to person, place, and time.  Psychiatric:        Mood and Affect: Mood normal.        Behavior: Behavior normal.     Assessment and Plan :   PDMP not reviewed this encounter.  1. Influenza    Will cover for influenza with Tamiflu given exposure, symptom set, current incidence in the community.  Use supportive care, rest, fluids, hydration, light meals, schedule Tylenol and ibuprofen. Counseled patient on potential for adverse effects with medications prescribed today, patient verbalized understanding. ER and return-to-clinic precautions discussed, patient verbalized understanding.    Wallis Bamberg, PA-C 03/15/23 1322

## 2023-03-16 ENCOUNTER — Encounter: Payer: Self-pay | Admitting: Oncology

## 2023-04-13 ENCOUNTER — Other Ambulatory Visit: Payer: Self-pay | Admitting: Family

## 2023-04-13 DIAGNOSIS — G43009 Migraine without aura, not intractable, without status migrainosus: Secondary | ICD-10-CM

## 2023-05-22 ENCOUNTER — Telehealth: Admitting: Family Medicine

## 2023-05-22 ENCOUNTER — Encounter: Payer: Self-pay | Admitting: Oncology

## 2023-05-22 DIAGNOSIS — B9689 Other specified bacterial agents as the cause of diseases classified elsewhere: Secondary | ICD-10-CM | POA: Diagnosis not present

## 2023-05-22 DIAGNOSIS — J019 Acute sinusitis, unspecified: Secondary | ICD-10-CM

## 2023-05-22 MED ORDER — AMOXICILLIN-POT CLAVULANATE 875-125 MG PO TABS: 1.0000 | ORAL_TABLET | Freq: Two times a day (BID) | ORAL | 0 refills | Status: DC

## 2023-05-22 NOTE — Patient Instructions (Signed)

## 2023-05-22 NOTE — Progress Notes (Signed)
 Virtual Visit Consent   Marilyn Wilkinson, you are scheduled for a virtual visit with a [facility] provider today. Just as with appointments in the office, your consent must be obtained to participate. Your consent will be active for this visit and any virtual visit you may have with one of our providers in the next 365 days. If you have a MyChart account, a copy of this consent can be sent to you electronically.  As this is a virtual visit, video technology does not allow for your provider to perform a traditional examination. This may limit your provider's ability to fully assess your condition. If your provider identifies any concerns that need to be evaluated in person or the need to arrange testing (such as labs, EKG, etc.), we will make arrangements to do so. Although advances in technology are sophisticated, we cannot ensure that it will always work on either your end or our end. If the connection with a video visit is poor, the visit may have to be switched to a telephone visit. With either a video or telephone visit, we are not always able to ensure that we have a secure connection.  By engaging in this virtual visit, you consent to the provision of healthcare and authorize for your insurance to be billed (if applicable) for the services provided during this visit. Depending on your insurance coverage, you may receive a charge related to this service.  I need to obtain your verbal consent now. Are you willing to proceed with your visit today? Marilyn Wilkinson has provided verbal consent on 05/22/2023 for a virtual visit (video or telephone). Marilyn Curio, FNP  Date: 05/22/2023 4:44 PM   Virtual Visit via Video Note   I, Marilyn Wilkinson, connected with  Marilyn Wilkinson  ([id number], 1980-05-20) on 05/22/23 at  4:45 PM EDT by a video-enabled telemedicine application and verified that I am speaking with the correct person using two identifiers.  Location: Patient: Virtual Visit Location Patient:  Home Provider: Virtual Visit Location Provider: Home Office   I discussed the limitations of evaluation and management by telemedicine and the availability of in person appointments. The patient expressed understanding and agreed to proceed.    History of Present Illness: Marilyn Wilkinson is a 43 y.o. who identifies as a female who was assigned female at birth, and is being seen today for sinus pain and pressure with maxillary tenderness. Allergies for a couple of weeks worsening into sinus pain with green mucus. Taking allergy meds and will add flonase. No fever. Mild cough. .  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   At risk for diabetes mellitus 02/26/2023   Positive ANA (antinuclear antibody) 02/26/2023   Seasonal allergic rhinitis due to pollen 11/03/2022   Frequent headaches 06/23/2022   Arthralgia 06/19/2022   Menopausal and female climacteric states 06/19/2022   Anxiety state 09/16/2021   Family history of rheumatoid arthritis 09/16/2021   Right bundle branch block (RBBB) on electrocardiogram (ECG) 08/22/2021   Morbid obesity (HCC) 08/22/2021   Endometriosis 02/06/2021   Migraine without aura and responsive to treatment 02/06/2021   Alpha thalassemia trait 07/25/2019   Microcytosis 02/03/2019   IDA (iron deficiency anemia) 02/03/2019   Vitamin D deficiency 01/24/2019   BMI 45.0-49.9, adult (HCC) 05/25/2017   PCOS (polycystic ovarian syndrome) 03/03/2011    Allergies:  Allergies  Allergen Reactions   Shellfish Allergy Hives  JUST SHRIMP   Medications:  Current Outpatient Medications:    amoxicillin-clavulanate (AUGMENTIN) 875-125 MG tablet, Take 1 tablet by mouth 2 (two) times daily., Disp: 20 tablet, Rfl: 0   aspirin-acetaminophen-caffeine (EXCEDRIN MIGRAINE) 250-250-65 MG tablet, Take 1 tablet by mouth every 6 (six) hours as needed for headache., Disp: , Rfl:    benzonatate  (TESSALON) 100 MG capsule, Take 1 capsule (100 mg total) by mouth 3 (three) times daily as needed for cough., Disp: 30 capsule, Rfl: 0   Cholecalciferol 1.25 MG (50000 UT) TABS, Take 1 tablet by mouth once a week., Disp: 12 tablet, Rfl: 0   clobetasol (TEMOVATE) 0.05 % external solution, Apply 1 Application topically 2 (two) times daily., Disp: 50 mL, Rfl: 0   ibuprofen (ADVIL) 600 MG tablet, Take 1 tablet (600 mg total) by mouth every 6 (six) hours as needed., Disp: 30 tablet, Rfl: 0   metFORMIN (GLUCOPHAGE) 500 MG tablet, Take 1 tablet (500 mg total) by mouth 2 (two) times daily with a meal., Disp: 180 tablet, Rfl: 3   montelukast (SINGULAIR) 10 MG tablet, Take 1 tablet (10 mg total) by mouth at bedtime., Disp: 30 tablet, Rfl: 3   oseltamivir (TAMIFLU) 75 MG capsule, Take 1 capsule (75 mg total) by mouth 2 (two) times daily., Disp: 10 capsule, Rfl: 0   promethazine-dextromethorphan (PROMETHAZINE-DM) 6.25-15 MG/5ML syrup, Take 5 mLs by mouth 3 (three) times daily as needed for cough., Disp: 200 mL, Rfl: 0   pseudoephedrine (SUDAFED) 60 MG tablet, Take 1 tablet (60 mg total) by mouth every 8 (eight) hours as needed for congestion., Disp: 30 tablet, Rfl: 0   SUMAtriptan (IMITREX) 50 MG tablet, TAKE 1 TABLET BY MOUTH EVERY 2 HRS AS NEEDED FOR MIGRAINE, MAY REPEAT IN 2 HRS IF HEADACHE PERSISTS OR RECURS, Disp: 10 tablet, Rfl: 0   tirzepatide (ZEPBOUND) 2.5 MG/0.5ML Pen, INJECT 2.5 MG INTO THE SKIN ONCE A WEEK. ONCE COMPLETE WITH FOUR WEEKS OF WEEKLY INJECTION WILL INCREASE TO 5 MG QWEEK WITH NEW RX, Disp: 2 mL, Rfl: 0   tirzepatide (ZEPBOUND) 5 MG/0.5ML Pen, Inject 5 mg into the skin once a week. After four weeks of 2.5 mg weekly increase to 5 mg once weekly, Disp: 2 mL, Rfl: 0   topiramate (TOPAMAX) 50 MG tablet, Take 1 tablet (50 mg total) by mouth daily., Disp: 90 tablet, Rfl: 1   vitamin B-12 (CYANOCOBALAMIN) 500 MCG tablet, Take 500 mcg by mouth daily., Disp: , Rfl:    Observations/Objective: Patient is well-developed, well-nourished in no acute distress.  Resting comfortably  at home.  Head is normocephalic, atraumatic.  No labored breathing.  Speech is clear and coherent with logical content.  Patient is alert and oriented at baseline.    Assessment and Plan: 1. Acute bacterial sinusitis (Primary)  Increase fluids, humidifier at night, tylenol or ibuprofen as directed, UC if sx worsen.  Follow Up Instructions: I discussed the assessment and treatment plan with the patient. The patient was provided an opportunity to ask questions and all were answered. The patient agreed with the plan and demonstrated an understanding of the instructions.  A copy of instructions were sent to the patient via MyChart unless otherwise noted below.     The patient was advised to call back or seek an in-person evaluation if the symptoms worsen or if the condition fails to improve as anticipated.    Marilyn Curio, FNP

## 2023-06-11 ENCOUNTER — Other Ambulatory Visit: Payer: Self-pay | Admitting: Family

## 2023-06-11 DIAGNOSIS — E559 Vitamin D deficiency, unspecified: Secondary | ICD-10-CM

## 2023-06-11 DIAGNOSIS — G43009 Migraine without aura, not intractable, without status migrainosus: Secondary | ICD-10-CM

## 2023-06-12 MED ORDER — CHOLECALCIFEROL 1.25 MG (50000 UT) PO TABS: 1.0000 | ORAL_TABLET | ORAL | 0 refills | Status: DC

## 2023-06-12 MED ORDER — SUMATRIPTAN SUCCINATE 50 MG PO TABS: ORAL_TABLET | ORAL | 0 refills | Status: DC

## 2023-07-01 ENCOUNTER — Encounter: Payer: Self-pay | Admitting: Oncology

## 2023-07-20 ENCOUNTER — Encounter: Payer: 59 | Admitting: Internal Medicine

## 2023-08-17 ENCOUNTER — Other Ambulatory Visit: Payer: Self-pay | Admitting: Family

## 2023-08-17 DIAGNOSIS — Z9189 Other specified personal risk factors, not elsewhere classified: Secondary | ICD-10-CM

## 2023-08-17 DIAGNOSIS — R7303 Prediabetes: Secondary | ICD-10-CM

## 2023-08-17 DIAGNOSIS — E282 Polycystic ovarian syndrome: Secondary | ICD-10-CM

## 2023-08-19 ENCOUNTER — Other Ambulatory Visit: Payer: Self-pay | Admitting: Family

## 2023-08-19 ENCOUNTER — Encounter: Payer: Self-pay | Admitting: Oncology

## 2023-08-19 DIAGNOSIS — G43009 Migraine without aura, not intractable, without status migrainosus: Secondary | ICD-10-CM

## 2023-08-20 ENCOUNTER — Ambulatory Visit: Admitting: Family

## 2023-09-07 ENCOUNTER — Encounter: Payer: Self-pay | Admitting: Internal Medicine

## 2023-09-07 ENCOUNTER — Ambulatory Visit: Attending: Internal Medicine | Admitting: Internal Medicine

## 2023-09-07 VITALS — BP 123/88 | HR 84 | Resp 17 | Ht 67.0 in | Wt 296.0 lb

## 2023-09-07 DIAGNOSIS — M722 Plantar fascial fibromatosis: Secondary | ICD-10-CM

## 2023-09-07 DIAGNOSIS — R768 Other specified abnormal immunological findings in serum: Secondary | ICD-10-CM

## 2023-09-07 DIAGNOSIS — L409 Psoriasis, unspecified: Secondary | ICD-10-CM | POA: Diagnosis not present

## 2023-09-07 NOTE — Progress Notes (Signed)
 Office Visit Note  Patient: Marilyn Wilkinson             Date of Birth: 07/02/1980           MRN: [id number]             PCP: Corwin Antu, FNP Referring: Corwin Antu, FNP Visit Date: 09/07/2023 Occupation: Medical billing  Subjective:  New Patient (Initial Visit) (Positive ANA , previous pain started believes it to be arthritis. )    Discussed the use of AI scribe software for clinical note transcription with the patient, who gave verbal consent to proceed.  History of Present Illness   Marilyn Wilkinson is a 43 year old female who presents for evaluation of potential inflammatory arthritis. She was referred by her primary doctor due to joint problems, abnormal lab tests, and family history of autoimmune disease.  She initially experienced joint pain last year, which has since resolved.  She thinks there was improvement due to increased physical activity. A positive ANA test was noted in January, with a low titer and dense fine speckled pattern, and a similar result was observed two years prior in July 2023. More specific antibody markers checked in January were negative.  She has scalp dandruff and there was a concern for possible scalp psoriasis from her other doctors.  She had a referral to dermatology to assess this but never saw them.  She was prescribed clobetasol  solution but has not used it because she feels symptoms are mild currently manages it just with head and shoulder shampoo.  She has a history of plantar fasciitis, that bothers her with symptoms intermittently.  She has had problems with right hand carpal tunnel syndrome, but currently does not experience significant pain. She has not been taking any consistent pain medication like Tylenol  or ibuprofen  recently.  She reports frequent bruising, which is not new, but notes a recent significant bruise on her right leg. No abnormal bleeding is reported.  She is currently not working but has a history of medical billing work,  which involved computer use and typing, potentially contributing to her carpal tunnel syndrome.      Labs reviewed 02/2023 ANA 1:40 DFS dsDNA, Sm, RNP, chromatin, SSA, SSb, Scl-70, Jo-1, Ribosomal P, centromere neg RF neg CCP neg MCV neg  08/2021 ANA 1:80 DFS   Activities of Daily Living:  Patient reports morning stiffness for  none.   Patient Denies nocturnal pain.  Difficulty dressing/grooming: Denies Difficulty climbing stairs: Denies Difficulty getting out of chair: Denies Difficulty using hands for taps, buttons, cutlery, and/or writing: Denies  Review of Systems  Constitutional:  Negative for fatigue.  HENT:  Negative for mouth sores and mouth dryness.   Eyes:  Negative for dryness.  Respiratory:  Negative for shortness of breath.   Cardiovascular:  Negative for chest pain and palpitations.  Gastrointestinal:  Positive for constipation. Negative for blood in stool and diarrhea.  Endocrine: Negative for increased urination.  Genitourinary:  Negative for involuntary urination.  Musculoskeletal:  Positive for muscle tenderness. Negative for joint pain, gait problem, joint pain, joint swelling, myalgias, muscle weakness, morning stiffness and myalgias.  Skin:  Positive for rash and sensitivity to sunlight. Negative for color change and hair loss.  Allergic/Immunologic: Negative for susceptible to infections.  Neurological:  Positive for headaches. Negative for dizziness.  Hematological:  Negative for swollen glands.  Psychiatric/Behavioral:  Negative for depressed mood and sleep disturbance. The patient is nervous/anxious.     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   At risk for diabetes mellitus 02/26/2023   Positive ANA (antinuclear antibody) 02/26/2023   Seasonal allergic rhinitis due to pollen 11/03/2022   Frequent headaches 06/23/2022    Arthralgia 06/19/2022   Menopausal and female climacteric states 06/19/2022   Anxiety state 09/16/2021   Family history of rheumatoid arthritis 09/16/2021   Right bundle branch block (RBBB) on electrocardiogram (ECG) 08/22/2021   Morbid obesity (HCC) 08/22/2021   Endometriosis 02/06/2021   Migraine without aura and responsive to treatment 02/06/2021   Alpha thalassemia trait 07/25/2019   Microcytosis 02/03/2019   IDA (iron  deficiency anemia) 02/03/2019   Vitamin D  deficiency 01/24/2019   BMI 45.0-49.9, adult (HCC) 05/25/2017   PCOS (polycystic ovarian syndrome) 03/03/2011    Past Medical History:  Diagnosis Date   Anemia    pt. 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 reports that BP flucuates, has never had any treatment for     In vitro fertilization    Infertility of tubal origin    Polycystic ovarian syndrome    Positive ANA (antinuclear antibody)    Right tubal pregnancy without intrauterine pregnancy 06/19/2017   05/29/17: single dose mtx given   Sleep apnea 2004   has lost weight & no longer having problems in this  area    Small bowel intussusception (HCC) 05/23/2021   Status post hysterectomy 12/29/2018   Transient hypertension 10/14/2018    Family History  Problem Relation Age of Onset   Asthma Mother    Fibromyalgia Mother    COPD Mother        passed at age 53 y/o, hypoxia   Heart disease Mother    Rheum arthritis Mother    Myasthenia gravis Mother    Heart disease Father    Hypertension Father    Diabetes Father    Kidney disease Father    Healthy Sister    Healthy Brother    Hypertension Maternal Grandmother    Arthritis/Rheumatoid Maternal Grandmother    Stroke Maternal Grandmother    Heart disease Maternal Grandfather    Heart defect Daughter        resolved at   Past  Surgical History:  Procedure Laterality Date   CESAREAN SECTION  06/26/2011   Procedure: CESAREAN SECTION;  Surgeon: Winton Felt, MD;  Location: [facility];  Service: Gynecology;  Laterality: N/A;   CYSTOSCOPY N/A 12/29/2018   Procedure: CYSTOSCOPY;  Surgeon: Izell Harari, MD;  Location: [facility] OR;  Service: Gynecology;  Laterality: N/A;   DIAGNOSTIC LAPAROSCOPY  07/24/2010   REMOVAL OF BILATERAL TUBES BLOCKAGE   GASTRIC BYPASS  2004   TOTAL LAPAROSCOPIC HYSTERECTOMY WITH SALPINGECTOMY Right 12/29/2018   Procedure: TOTAL LAPAROSCOPIC HYSTERECTOMY WITH RIGHT SALPINGECTOMY;  Surgeon: Izell Harari, MD;  Location: [facility] OR;  Service: Gynecology;  Laterality: Right;   Social History   Social History Narrative   Two twin girls   Immunization History  Administered Date(s) Administered   Influenza Split 04/14/2011, 11/24/2014   PFIZER(Purple Top)SARS-COV-2 Vaccination 07/09/2019, 08/06/2019   Tdap 06/29/2011, 07/13/2013     Objective: Vital Signs: BP 123/88 (BP Location: Right Arm, Patient Position:  Sitting, Cuff Size: Normal)   Pulse 84   Resp 17   Ht 5' 7 (1.702 m)   Wt 296 lb (134.3 kg)   LMP 12/12/2018   BMI 46.36 kg/m    Physical Exam Constitutional:      Appearance: She is obese.  HENT:     Mouth/Throat:     Mouth: Mucous membranes are moist.     Pharynx: Oropharynx is clear.  Eyes:     Conjunctiva/sclera: Conjunctivae normal.  Cardiovascular:     Rate and Rhythm: Normal rate and regular rhythm.  Pulmonary:     Effort: Pulmonary effort is normal.     Breath sounds: Normal breath sounds.  Musculoskeletal:     Right lower leg: No edema.     Left lower leg: No edema.  Lymphadenopathy:     Cervical: No cervical adenopathy.  Skin:    General: Skin is warm and dry.     Comments: Bruise on medial right leg just below knee Normal-appearing nailfold capillaroscopy No digital pitting  Neurological:     Mental Status: She is alert.  Psychiatric:        Mood and Affect:  Mood normal.      Musculoskeletal Exam:  Shoulders full ROM no tenderness or swelling Elbows full ROM no tenderness or swelling Wrists full ROM no tenderness or swelling Fingers full ROM no tenderness or swelling Knees full ROM no tenderness or swelling, R>L patellofemoral crepitus Ankles full ROM no tenderness or swelling MTPs full ROM no tenderness or swelling, mild 1st MTP bunions   Investigation: No additional findings.  Imaging: No results found.  Recent Labs: Lab Results  Component Value Date   WBC 7.5 02/26/2023   HGB 13.1 02/26/2023   PLT 276.0 02/26/2023   NA 138 02/26/2023   K 4.7 02/26/2023   CL 104 02/26/2023   CO2 28 02/26/2023   GLUCOSE 84 02/26/2023   BUN 14 02/26/2023   CREATININE 0.70 02/26/2023   BILITOT 0.6 02/26/2023   ALKPHOS 72 02/26/2023   AST 17 02/26/2023   ALT 17 02/26/2023   PROT 6.9 02/26/2023   ALBUMIN 4.3 02/26/2023   CALCIUM  9.0 02/26/2023   GFRAA >60 04/12/2019    Speciality Comments: No specialty comments available.  Procedures:  No procedures performed Allergies: Shellfish allergy   Assessment / Plan:     Visit Diagnoses: Positive ANA (antinuclear antibody) Positive ANA test Intermittent joint pain with positive ANA at low titer (1:40), dense fine speckled pattern. Specific antibody markers negative. Symptoms minimal, no active inflammation on exam although she also reports symptoms being much improved compared to what she was dealing with last year.  No red flags no specific clinical criteria on history or physical exam.  Discussed option of checking serum inflammatory markers but I think would be more useful to monitor and possibly recheck these at a time when symptoms come back. - Monitor symptoms, return if joint pain recurs or worsens. - Consider rechecking inflammation markers if symptoms return.  Patellofemoral crepitus Mild crepitus indicating cartilage wear between kneecap and femur. No severe arthritis or knee  replacement needed.  Discussed management strategies including weight loss and upper leg strengthening exercises with some provided in visit summary today.  Plantar fasciitis Intermittent symptoms resolved without treatment.  She has very low arches probably some overpronation in her stride contributing.  Provided print out plantar fasciitis exercises with visit summary today.  Dandruff Scalp flakiness with very mild could be consistent with very mild psoriasis but  there is no erythema or induration, and with no plaques elsewhere and nail changes I would not see any indication for systemic treatment.  Discussed she could try using a nice overall brand medicated shampoo to see if it works any better otherwise okay to continue current plan.       Orders: No orders of the defined types were placed in this encounter.  No orders of the defined types were placed in this encounter.  Follow-Up Instructions: Return if symptoms worsen or fail to improve.   Marilyn Wilkinson Ester, MD  Note - This record has been created using [employer].  Chart creation errors have been sought, but may not always  have been located. Such creation errors do not reflect on  the standard of medical care.

## 2023-09-07 NOTE — Patient Instructions (Addendum)
 Consider trying Nizoral shampoo for possible mild psoriasis scalp rash.  Straight leg raises This exercise strengthens the muscles in front of your thigh (quadriceps) and the muscles that move your hips (hip flexors). Lie on your back with your left / right leg extended and your other knee bent. Tense the muscles in the front of your left / right thigh. You should see your kneecap slide up or see increased dimpling just above the knee. Your thigh may even shake a bit. Keep these muscles tight as you raise your leg 4-6 inches (10-15 cm) off the floor. Do not let your knee bend. Hold this position for __________ seconds. Keep these muscles tense as you lower your leg. Relax your muscles slowly and completely after each repetition. Repeat __________ times. Complete this exercise __________ times a day. Squats This exercise strengthens the muscles in front of your thigh and knee (quadriceps). Stand in front of a table, with your feet and knees pointing straight ahead. You may rest your hands on the table for balance but not for support. Slowly bend your knees and lower your hips like you are going to sit in a chair. Keep your weight over your heels, not over your toes. Keep your lower legs upright so they are parallel with the table legs. Do not let your hips go lower than your knees. Do not bend lower than told by your health care provider. If your knee pain increases, do not bend as low. Hold the squat position for __________ seconds. Slowly push with your legs to return to standing. Do not use your hands to pull yourself to standing. Repeat __________ times. Complete this exercise __________ times a day. Wall slides This exercise strengthens the muscles in front of your thigh and knee (quadriceps). Lean your back against a smooth wall or door, and walk your feet out 18-24 inches (46-61 cm) from it. Place your feet hip-width apart. Slowly slide down the wall or door until your knees bend  __________ degrees. Keep your knees over your heels, not over your toes. Keep your knees in line with your hips. Hold this position for __________ seconds. Repeat __________ times. Complete this exercise __________ times a day. Straight leg raises, side-lying This exercise strengthens the muscles that rotate the leg at the hip and move it away from your body (hip abductors). Lie on your side with your left / right leg in the top position. Lie so your head, shoulder, knee, and hip line up. You may bend your bottom knee to help you keep your balance. Roll your hips slightly forward so your hips are stacked directly over each other and your left / right knee is facing forward. Leading with your heel, lift your top leg 4-6 inches (10-15 cm). You should feel the muscles in your outer hip lifting. Do not let your foot drift forward. Do not let your knee roll toward the ceiling. Hold this position for __________ seconds. Slowly return your leg to the starting position. Let your muscles relax completely after each repetition. Repeat __________ times. Complete this exercise __________ times a day.

## 2023-09-16 ENCOUNTER — Other Ambulatory Visit: Payer: Self-pay | Admitting: Obstetrics and Gynecology

## 2023-09-16 DIAGNOSIS — Z1231 Encounter for screening mammogram for malignant neoplasm of breast: Secondary | ICD-10-CM

## 2023-09-19 ENCOUNTER — Other Ambulatory Visit: Payer: Self-pay | Admitting: Family

## 2023-09-19 DIAGNOSIS — G43009 Migraine without aura, not intractable, without status migrainosus: Secondary | ICD-10-CM

## 2023-09-23 ENCOUNTER — Encounter: Payer: Self-pay | Admitting: Family

## 2023-09-23 ENCOUNTER — Ambulatory Visit (INDEPENDENT_AMBULATORY_CARE_PROVIDER_SITE_OTHER): Admitting: Family

## 2023-09-23 VITALS — BP 128/91 | HR 100 | Temp 98.7°F | Wt 297.0 lb

## 2023-09-23 DIAGNOSIS — L089 Local infection of the skin and subcutaneous tissue, unspecified: Secondary | ICD-10-CM | POA: Diagnosis not present

## 2023-09-23 DIAGNOSIS — Z9189 Other specified personal risk factors, not elsewhere classified: Secondary | ICD-10-CM

## 2023-09-23 DIAGNOSIS — G43009 Migraine without aura, not intractable, without status migrainosus: Secondary | ICD-10-CM

## 2023-09-23 DIAGNOSIS — R519 Headache, unspecified: Secondary | ICD-10-CM

## 2023-09-23 MED ORDER — CEPHALEXIN 500 MG PO CAPS: 500.0000 mg | ORAL_CAPSULE | Freq: Three times a day (TID) | ORAL | 0 refills | Status: AC

## 2023-09-23 MED ORDER — TOPIRAMATE 50 MG PO TABS: 50.0000 mg | ORAL_TABLET | Freq: Every day | ORAL | 1 refills | Status: AC

## 2023-09-23 MED ORDER — PHENTERMINE HCL 8 MG PO TABS: ORAL_TABLET | ORAL | 1 refills | Status: DC

## 2023-09-23 NOTE — Progress Notes (Signed)
 Established Patient Office Visit  Subjective:      CC:  Chief Complaint  Patient presents with   Acute Visit    Possible infected toe nail    HPI: Marilyn Wilkinson is a 43 y.o. female presenting on 09/23/2023 for Acute Visit (Possible infected toe nail) .  Discussed the use of AI scribe software for clinical note transcription with the patient, who gave verbal consent to proceed.  History of Present Illness Marilyn Wilkinson is a 44 year old female who presents with a painful and possibly infected toenail on the second toe of the right foot.  She noticed a significant change in the appearance of her second toe on the right foot after returning from a vacation, following a pedicure. The toenail has a 'hole in it' and is 'risen' underneath. The toe is painful to the touch, with moments of significant pain, and there is a small amount of dry discharge, attributed to her own manipulation. She can move her foot without pain currently, although it has been painful in the past few days. There is no pain when standing or putting weight on it.  She experiences migraines more frequently during the summer, about twice a week. She is currently taking sumatriptan  as needed for acute episodes. She was prescribed topiramate  as a daily preventative for migraines but has not been taking it regularly. No side effects from topiramate  are reported.  She has a history of weight management challenges and has been walking on a treadmill three days a week. She previously tried phentermine  for weight loss, which she found helpful, but stopped in January. No significant side effects from past use of phentermine  are noted.  Wt Readings from Last 3 Encounters:  09/23/23 297 lb (134.7 kg)  09/07/23 296 lb (134.3 kg)  02/26/23 291 lb 12.8 oz (132.4 kg)          Social history:  Relevant past medical, surgical, family and social history reviewed and updated as indicated. Interim medical history since our last  visit reviewed.  Allergies and medications reviewed and updated.  DATA REVIEWED: CHART IN EPIC     ROS: Negative unless specifically indicated above in HPI.    Current Outpatient Medications:    aspirin -acetaminophen -caffeine (EXCEDRIN MIGRAINE) 250-250-65 MG tablet, Take 1 tablet by mouth every 6 (six) hours as needed for headache., Disp: , Rfl:    cephALEXin  (KEFLEX ) 500 MG capsule, Take 1 capsule (500 mg total) by mouth 3 (three) times daily for 7 days., Disp: 21 capsule, Rfl: 0   cetirizine (ZYRTEC) 10 MG tablet, Take 10 mg by mouth daily., Disp: , Rfl:    Cholecalciferol  1.25 MG (50000 UT) TABS, Take 1 tablet by mouth once a week., Disp: 12 tablet, Rfl: 0   metFORMIN  (GLUCOPHAGE ) 500 MG tablet, TAKE 1 TABLET BY MOUTH 2 TIMES DAILY WITH A MEAL., Disp: 180 tablet, Rfl: 1   Phentermine  HCl 8 MG TABS, Take 1/2 tablet for two weeks then increase to one tablet once daily, Disp: 30 tablet, Rfl: 1   SUMAtriptan  (IMITREX ) 50 MG tablet, TAKE 1 TABLET AS NEEDED FOR MIGRAINE, MAY REPEAT IN 2 HOURS X 1 DOSE IF HEADACHE PERSISTS OR RECURS., Disp: 10 tablet, Rfl: 0   topiramate  (TOPAMAX ) 50 MG tablet, Take 1 tablet (50 mg total) by mouth daily., Disp: 90 tablet, Rfl: 1        Objective:        BP (!) 128/91   Pulse 100   Temp 98.7  F (37.1 C) (Temporal)   Wt 297 lb (134.7 kg)   LMP 12/12/2018   SpO2 96%   BMI 46.52 kg/m   Physical Exam VITALS: BP- 142/96 CARDIOVASCULAR: Heart sounds normal. EXTREMITIES: Second toe on right foot, nail risen, fungal infection, painful to touch.  Wt Readings from Last 3 Encounters:  09/23/23 297 lb (134.7 kg)  09/07/23 296 lb (134.3 kg)  02/26/23 291 lb 12.8 oz (132.4 kg)    Physical Exam        Results DIAGNOSTIC EKG: Irregular right bundle branch block, abnormal. No atrial septal defect.  Assessment & Plan:   Assessment and Plan Assessment & Plan Local infection and onychomycosis of right second toe The right second toe  exhibits a local infection with onychomycosis, likely due to a nick during a pedicure. The toe is painful upon touch with some dry discharge, but not warm. The infection is not ingrown but may require intervention if it worsens. - Prescribe Cefalexin to be taken three times a day for seven days. - Advise warm soaks several times a day to reduce swelling and irritation. - Place a referral to podiatry if the condition worsens and requires further intervention.  Morbid obesity with abnormal weight gain Morbid obesity with abnormal weight gain is being managed. She is considering Zepbound  due to insurance issues. Currently walking on a treadmill three days a week and advised to incorporate strength training. Previous use of Contrave was not tolerated due to nausea. Phentermine  and topiramate  (Qsymia) are being considered as a cost-effective alternative to GLP-1s. Phentermine  carries a risk of increased blood pressure, tachycardia, and heart attack, but may be viable given her normal heart evaluation. - Prescribe phentermine  to be taken as half a tablet for 14 days, then increase to one tablet daily. - Continue topiramate  daily as a preventative for migraines and as part of the weight management plan. - Advise monitoring blood pressure twice daily for the first week of phentermine  use. - Schedule follow-up in six weeks to assess weight management progress and medication tolerance.  Migraine Migraines are more frequent during the summer, occurring about twice a week. She is on sumatriptan  for acute migraine relief and topiramate  as a preventative, though she has not been taking topiramate  consistently. No reported side effects from topiramate . - Instruct to take topiramate  daily as a preventative measure. - Continue sumatriptan  as needed for acute migraine relief.  Elevated blood pressure Likely r/t to on the go this am, improved upon resting.  Pt advised of the following:  Continue medication as  prescribed. Monitor blood pressure periodically and/or when you feel symptomatic. Goal is <130/90 on average. Ensure that you have rested for 30 minutes prior to checking your blood pressure. Record your readings and bring them to your next visit if necessary.work on a low sodium diet.  Recording duration: 22 minutes      Return in about 6 weeks (around 11/04/2023) for f/u weight loss medication.     Ginger [name], MSN, APRN, FNP-C [facility]

## 2023-09-24 ENCOUNTER — Encounter: Payer: Self-pay | Admitting: Oncology

## 2023-09-25 MED ORDER — PHENTERMINE HCL 8 MG PO TABS: ORAL_TABLET | ORAL | 1 refills | Status: DC

## 2023-09-25 NOTE — Telephone Encounter (Signed)
 Phentermine  needs PA

## 2023-09-25 NOTE — Addendum Note (Signed)
 Addended by: CORWIN ANTU on: 09/25/2023 11:18 AM   Modules accepted: Orders

## 2023-09-28 ENCOUNTER — Other Ambulatory Visit (HOSPITAL_COMMUNITY): Payer: Self-pay

## 2023-09-28 ENCOUNTER — Telehealth: Payer: Self-pay

## 2023-09-28 NOTE — Telephone Encounter (Signed)
 Pharmacy Patient Advocate Encounter   Received notification from Pt Calls Messages that prior authorization for Phentermine  8 mg tabs is required/requested.   Insurance verification completed.   The patient is insured through [employer] .   Per test claim: patient has plan benefit exclusion. Cash copay $34.35 for 30 day supply.

## 2023-10-02 ENCOUNTER — Telehealth: Payer: Self-pay | Admitting: Family

## 2023-10-02 NOTE — Telephone Encounter (Signed)
 Per Tabitha >> Please r/s CPE. I recently had an issue with the schedule overbooking my session limits and this was one that was added during that time frame.   Can we please call to reschedule to when next CPE availability is?

## 2023-10-03 ENCOUNTER — Other Ambulatory Visit: Payer: Self-pay | Admitting: Medical Genetics

## 2023-10-07 ENCOUNTER — Ambulatory Visit (INDEPENDENT_AMBULATORY_CARE_PROVIDER_SITE_OTHER): Admitting: Podiatry

## 2023-10-07 ENCOUNTER — Encounter: Payer: Self-pay | Admitting: Podiatry

## 2023-10-07 VITALS — Ht 66.0 in | Wt 296.0 lb

## 2023-10-07 DIAGNOSIS — B351 Tinea unguium: Secondary | ICD-10-CM

## 2023-10-07 DIAGNOSIS — Z79899 Other long term (current) drug therapy: Secondary | ICD-10-CM

## 2023-10-07 NOTE — Progress Notes (Signed)
 Subjective:  Patient ID: Marilyn Wilkinson, female    DOB: 07-Feb-1981,  MRN: [id number]  Chief Complaint  Patient presents with   Nail Problem    R 2nd toe nail infection. Pain localized in and under nail.  Started early Aug after pedicure and got worse. Did not soak Non Diabetic no anti coags    43 y.o. female presents with the above complaint.  Patient presents with thickened elongated [place] mycotic toenails x 10 mild pain on palpation hurts with ambulation or shoe pressure she would like to discuss treatment options for nail fungus she has not seen it was prior to seeing me denies any other acute complaints would like to discuss treatment options will try some topical which has not helped.   Review of Systems: Negative except as noted in the HPI. Denies N/V/F/Ch.  Past Medical History:  Diagnosis Date   Anemia    pt. 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 reports that BP flucuates, has never had any treatment for     In vitro fertilization    Infertility of tubal origin    Polycystic ovarian syndrome    Positive ANA (antinuclear antibody)    Right tubal pregnancy without intrauterine pregnancy 06/19/2017   05/29/17: single dose mtx given   Sleep apnea 2004   has lost weight & no longer having problems in this  area    Small bowel intussusception (HCC) 05/23/2021   Status post hysterectomy 12/29/2018   Transient hypertension 10/14/2018    Current Outpatient Medications:    aspirin -acetaminophen -caffeine (EXCEDRIN MIGRAINE) 250-250-65 MG tablet, Take 1 tablet by mouth every 6 (six) hours as needed for headache., Disp: , Rfl:    cetirizine (ZYRTEC) 10 MG tablet, Take 10 mg by mouth daily., Disp: , Rfl:    metFORMIN  (GLUCOPHAGE ) 500 MG tablet, TAKE 1 TABLET BY MOUTH 2 TIMES DAILY WITH A MEAL., Disp: 180 tablet, Rfl:  1   Phentermine  HCl 8 MG TABS, Take 1/2 tablet for two weeks then increase to one tablet once daily, Disp: 30 tablet, Rfl: 1   SUMAtriptan  (IMITREX ) 50 MG tablet, TAKE 1 TABLET AS NEEDED FOR MIGRAINE, MAY REPEAT IN 2 HOURS X 1 DOSE IF HEADACHE PERSISTS OR RECURS., Disp: 10 tablet, Rfl: 0   topiramate  (TOPAMAX ) 50 MG tablet, Take 1 tablet (50 mg total) by mouth daily., Disp: 90 tablet, Rfl: 1  Social History   Tobacco Use  Smoking Status Never   Passive exposure: Past  Smokeless Tobacco Never    Allergies  Allergen Reactions   Shellfish Allergy Hives    JUST SHRIMP   Contrave [Naltrexone-Bupropion Hcl Er] Nausea Only   Objective:  There were no vitals filed for this visit. Body mass index is 47.78 kg/m. Constitutional Well developed. Well nourished.  Vascular Dorsalis pedis pulses palpable bilaterally. Posterior tibial pulses palpable bilaterally. Capillary refill normal to all digits.  No cyanosis or clubbing noted. Pedal hair growth normal.  Neurologic Normal speech. Oriented to person, place, and time. Epicritic sensation to light touch grossly present bilaterally.  Dermatologic Nails thickened onychodystrophy mycotic toenails x 10 Skin within normal limits  Orthopedic: Normal joint ROM without pain or crepitus bilaterally. No visible deformities. No bony tenderness.   Radiographs: None Assessment:  1. Long-term use of high-risk medication   2. Nail fungus   3. Onychomycosis due to dermatophyte    Plan:  Patient was evaluated and treated and all questions answered.  Onychomycosis toenails x 10 -Educated the patient on the etiology of onychomycosis and various treatment options associated with improving the fungal load.  I explained to the patient that there is 3 treatment options available to treat the onychomycosis including topical, p.o., laser treatment.  Patient elected to undergo p.o. options with Lamisil/terbinafine therapy.  In order for me to start the  medication therapy, I explained to the patient the importance of evaluating the liver and obtaining the liver function test.  Once the liver function test comes back normal I will start him on 27-month course of Lamisil therapy.  Patient understood all risk and would like to proceed with Lamisil therapy.  I have asked the patient to immediately stop the Lamisil therapy if she has any reactions to it and call the office or go to the emergency room right away.  Patient states understanding   No follow-ups on file.

## 2023-10-09 ENCOUNTER — Ambulatory Visit
Admission: RE | Admit: 2023-10-09 | Discharge: 2023-10-09 | Disposition: A | Discharge status: Home / Self Care | Source: Ambulatory Visit | Attending: Obstetrics and Gynecology | Admitting: Obstetrics and Gynecology

## 2023-10-09 DIAGNOSIS — Z1231 Encounter for screening mammogram for malignant neoplasm of breast: Secondary | ICD-10-CM | POA: Diagnosis not present

## 2023-10-16 NOTE — Telephone Encounter (Signed)
 Any update on this? Pt is scheduled for CPE that exceeds Tabitha's session limits.

## 2023-10-16 NOTE — Telephone Encounter (Signed)
 I am bit frustrated as well.  This was one of the one's that was scheduled with the scheduling session limit error, and so it reflects poorly on me.   However, leave pt as is to avoid conflict.

## 2023-10-19 ENCOUNTER — Ambulatory Visit
Admission: EM | Admit: 2023-10-19 | Discharge: 2023-10-19 | Disposition: A | Discharge status: Home / Self Care | Attending: Family Medicine | Admitting: Family Medicine

## 2023-10-19 DIAGNOSIS — B9789 Other viral agents as the cause of diseases classified elsewhere: Secondary | ICD-10-CM | POA: Diagnosis not present

## 2023-10-19 DIAGNOSIS — R051 Acute cough: Secondary | ICD-10-CM | POA: Diagnosis not present

## 2023-10-19 DIAGNOSIS — J988 Other specified respiratory disorders: Secondary | ICD-10-CM | POA: Diagnosis not present

## 2023-10-19 DIAGNOSIS — J309 Allergic rhinitis, unspecified: Secondary | ICD-10-CM | POA: Diagnosis not present

## 2023-10-19 LAB — POC SOFIA SARS ANTIGEN FIA: SARS Coronavirus 2 Ag: NEGATIVE

## 2023-10-19 MED ORDER — PREDNISONE 10 MG PO TABS: 30.0000 mg | ORAL_TABLET | Freq: Every day | ORAL | 0 refills | Status: DC

## 2023-10-19 MED ORDER — PROMETHAZINE-DM 6.25-15 MG/5ML PO SYRP: 5.0000 mL | ORAL_SOLUTION | Freq: Three times a day (TID) | ORAL | 0 refills | Status: DC | PRN

## 2023-10-19 NOTE — ED Triage Notes (Signed)
 Pt reports cough, headache, fatigue and sore throat x 2 days. States her kids had same symptoms last week. Sudafed and Aleve gives some relief.

## 2023-10-19 NOTE — Discharge Instructions (Signed)
 We will manage this as a viral syndrome. For sore throat or cough try using a honey-based tea. Use 3 teaspoons of honey with juice squeezed from half lemon. Place shaved pieces of ginger into 1/2-1 cup of water and warm over stove top. Then mix the ingredients and repeat every 4 hours as needed. Please take Tylenol 500mg -650mg  once every 6 hours for fevers, aches and pains. Hydrate very well with at least 2 liters (64 ounces) of water. Eat light meals such as soups (chicken and noodles, chicken wild rice, vegetable).  Do not eat any foods that you are allergic to.  Start an antihistamine like Zyrtec (10mg  daily) for postnasal drainage, sinus congestion.  You can take this together with prednisone and cough syrup.

## 2023-10-19 NOTE — ED Provider Notes (Signed)
 [facility]  Note:  This document was prepared using Conservation officer, historic buildings and may include unintentional dictation errors.  MRN: [id number] DOB: [birth year]  Subjective:   Marilyn Wilkinson is a 43 y.o. female presenting for 2 day history of sinus headache, sinus congestion, throat pain, slightly productive cough, subjective fever. No chest pain, shob, wheezing. No asthma. No smoking of any kind including cigarettes, cigars, vaping, marijuana use.  Had multiple sick contact with her school age children.   No current facility-administered medications for this encounter.  Current Outpatient Medications:    naproxen sodium (ALEVE) 220 MG tablet, Take 220 mg by mouth., Disp: , Rfl:    pseudoephedrine  (SUDAFED) 120 MG 12 hr tablet, Take 120 mg by mouth 2 (two) times daily., Disp: , Rfl:    aspirin -acetaminophen -caffeine (EXCEDRIN MIGRAINE) 250-250-65 MG tablet, Take 1 tablet by mouth every 6 (six) hours as needed for headache., Disp: , Rfl:    cetirizine (ZYRTEC) 10 MG tablet, Take 10 mg by mouth daily., Disp: , Rfl:    metFORMIN  (GLUCOPHAGE ) 500 MG tablet, TAKE 1 TABLET BY MOUTH 2 TIMES DAILY WITH A MEAL., Disp: 180 tablet, Rfl: 1   Phentermine  HCl 8 MG TABS, Take 1/2 tablet for two weeks then increase to one tablet once daily, Disp: 30 tablet, Rfl: 1   SUMAtriptan  (IMITREX ) 50 MG tablet, TAKE 1 TABLET AS NEEDED FOR MIGRAINE, MAY REPEAT IN 2 HOURS X 1 DOSE IF HEADACHE PERSISTS OR RECURS., Disp: 10 tablet, Rfl: 0   topiramate  (TOPAMAX ) 50 MG tablet, Take 1 tablet (50 mg total) by mouth daily., Disp: 90 tablet, Rfl: 1   Allergies  Allergen Reactions   Shellfish Allergy Hives    JUST SHRIMP   Contrave [Naltrexone-Bupropion Hcl Er] Nausea Only    Past Medical History:  Diagnosis Date   Anemia    pt. 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 reports that BP flucuates, has never had any treatment for     In vitro fertilization    Infertility of tubal origin    Polycystic ovarian syndrome    Positive ANA (antinuclear antibody)    Right tubal pregnancy without intrauterine pregnancy 06/19/2017   05/29/17: single dose mtx given   Sleep apnea 2004   has lost weight & no longer having problems in this  area    Small bowel intussusception (HCC) 05/23/2021   Status post hysterectomy 12/29/2018   Transient hypertension 10/14/2018     Past Surgical History:  Procedure Laterality Date   CESAREAN SECTION  06/26/2011   Procedure: CESAREAN SECTION;  Surgeon: Winton Felt, MD;  Location: [facility];  Service: Gynecology;  Laterality: N/A;   CYSTOSCOPY N/A 12/29/2018   Procedure: CYSTOSCOPY;  Surgeon: Izell Harari, MD;  Location: [facility] OR;  Service: Gynecology;  Laterality: N/A;   DIAGNOSTIC LAPAROSCOPY  07/24/2010   REMOVAL OF BILATERAL TUBES BLOCKAGE   GASTRIC BYPASS  2004   TOTAL LAPAROSCOPIC HYSTERECTOMY WITH SALPINGECTOMY Right 12/29/2018   Procedure: TOTAL LAPAROSCOPIC HYSTERECTOMY WITH RIGHT SALPINGECTOMY;  Surgeon: Izell Harari, MD;  Location: [facility];  Service: Gynecology;  Laterality: Right;    Family History  Problem Relation Age of Onset   Asthma Mother    Fibromyalgia Mother  COPD Mother        passed at age 62 y/o, hypoxia   Heart disease Mother    Rheum arthritis Mother    Myasthenia gravis Mother    Heart disease Father    Hypertension Father    Diabetes Father    Kidney disease Father    Healthy Sister    Healthy Brother    Hypertension Maternal Grandmother    Arthritis/Rheumatoid Maternal Grandmother    Stroke Maternal Grandmother    Heart disease Maternal Grandfather    Heart defect Daughter        resolved at    Social History   Tobacco Use   Smoking status: Never    Passive exposure: Past   Smokeless tobacco: Never  Vaping Use    Vaping status: Never Used  Substance Use Topics   Alcohol use: Yes    Comment: socially   Drug use: No    ROS   Objective:   Vitals: BP (!) 139/100 (BP Location: Right Arm)   Pulse (!) 111   Temp 98.5 F (36.9 C) (Oral)   Resp 18   LMP 12/12/2018   SpO2 95%   Physical Exam Constitutional:      General: She is not in acute distress.    Appearance: Normal appearance. She is well-developed and normal weight. She is not ill-appearing, toxic-appearing or diaphoretic.  HENT:     Head: Normocephalic and atraumatic.     Right Ear: Tympanic membrane, ear canal and external ear normal. No drainage or tenderness. No middle ear effusion. There is no impacted cerumen. Tympanic membrane is not erythematous or bulging.     Left Ear: Tympanic membrane, ear canal and external ear normal. No drainage or tenderness.  No middle ear effusion. There is no impacted cerumen. Tympanic membrane is not erythematous or bulging.     Nose: Nose normal. No congestion or rhinorrhea.     Mouth/Throat:     Mouth: Mucous membranes are moist. No oral lesions.     Pharynx: No pharyngeal swelling, oropharyngeal exudate, posterior oropharyngeal erythema or uvula swelling.     Tonsils: No tonsillar exudate or tonsillar abscesses.  Eyes:     General: No scleral icterus.       Right eye: No discharge.        Left eye: No discharge.     Extraocular Movements: Extraocular movements intact.     Right eye: Normal extraocular motion.     Left eye: Normal extraocular motion.     Conjunctiva/sclera: Conjunctivae normal.  Cardiovascular:     Rate and Rhythm: Normal rate and regular rhythm.     Heart sounds: Normal heart sounds. No murmur heard.    No friction rub. No gallop.  Pulmonary:     Effort: Pulmonary effort is normal. No respiratory distress.     Breath sounds: No stridor. No wheezing, rhonchi or rales.  Chest:     Chest wall: No tenderness.  Musculoskeletal:     Cervical back: Normal range of motion and  neck supple.  Lymphadenopathy:     Cervical: No cervical adenopathy.  Skin:    General: Skin is warm and dry.  Neurological:     General: No focal deficit present.     Mental Status: She is alert and oriented to person, place, and time.  Psychiatric:        Mood and Affect: Mood normal.        Behavior: Behavior normal.     Results  for orders placed or performed during the hospital encounter of 10/19/23 (from the past 24 hours)  POC SARS Coronavirus 2 Ag     Status: None   Collection Time: 10/19/23  1:30 PM  Result Value Ref Range   SARS Coronavirus 2 Ag Negative Negative    Assessment and Plan :   PDMP not reviewed this encounter.  1. Viral respiratory infection   2. Acute cough   3. Allergic rhinitis, unspecified seasonality, unspecified trigger    Patient has significant allergies likely worsening her viral respiratory infection symptoms.  Offered a lower dose of prednisone  to help with this with her underlying allergies as well.  Recommend supportive care otherwise.  Hold pseudoephedrine .  For imaging given lack of adventitious lung sounds.  Counseled patient on potential for adverse effects with medications prescribed/recommended today, ER and return-to-clinic precautions discussed, patient verbalized understanding.    Christopher Savannah, PA-C 10/19/23 1356

## 2023-10-21 ENCOUNTER — Encounter: Admitting: Family

## 2023-11-04 ENCOUNTER — Ambulatory Visit: Admitting: Family

## 2023-11-12 ENCOUNTER — Other Ambulatory Visit: Payer: Self-pay | Admitting: Family

## 2023-11-12 DIAGNOSIS — G43009 Migraine without aura, not intractable, without status migrainosus: Secondary | ICD-10-CM

## 2023-11-20 ENCOUNTER — Other Ambulatory Visit: Payer: Self-pay | Admitting: *Deleted

## 2023-11-20 DIAGNOSIS — Z006 Encounter for examination for normal comparison and control in clinical research program: Secondary | ICD-10-CM

## 2023-11-30 ENCOUNTER — Encounter: Admitting: Family

## 2023-12-08 ENCOUNTER — Encounter: Payer: Self-pay | Admitting: Family

## 2023-12-08 ENCOUNTER — Ambulatory Visit: Admitting: Family

## 2023-12-08 VITALS — BP 122/86 | HR 88 | Temp 98.1°F | Ht 66.0 in | Wt 293.2 lb

## 2023-12-08 DIAGNOSIS — R7303 Prediabetes: Secondary | ICD-10-CM

## 2023-12-08 DIAGNOSIS — D500 Iron deficiency anemia secondary to blood loss (chronic): Secondary | ICD-10-CM

## 2023-12-08 DIAGNOSIS — E282 Polycystic ovarian syndrome: Secondary | ICD-10-CM | POA: Diagnosis not present

## 2023-12-08 DIAGNOSIS — Z23 Encounter for immunization: Secondary | ICD-10-CM

## 2023-12-08 DIAGNOSIS — Z9189 Other specified personal risk factors, not elsewhere classified: Secondary | ICD-10-CM | POA: Diagnosis not present

## 2023-12-08 DIAGNOSIS — E559 Vitamin D deficiency, unspecified: Secondary | ICD-10-CM

## 2023-12-08 DIAGNOSIS — Z6841 Body Mass Index (BMI) 40.0 and over, adult: Secondary | ICD-10-CM

## 2023-12-08 DIAGNOSIS — Z13220 Encounter for screening for lipoid disorders: Secondary | ICD-10-CM | POA: Diagnosis not present

## 2023-12-08 LAB — CBC
HCT: 42.9 % (ref 36.0–46.0)
Hemoglobin: 13.3 g/dL (ref 12.0–15.0)
MCHC: 31.1 g/dL (ref 30.0–36.0)
MCV: 74.1 fl — ABNORMAL LOW (ref 78.0–100.0)
Platelets: 274 K/uL (ref 150.0–400.0)
RBC: 5.79 Mil/uL — ABNORMAL HIGH (ref 3.87–5.11)
RDW: 18.3 % — ABNORMAL HIGH (ref 11.5–15.5)
WBC: 8.1 K/uL (ref 4.0–10.5)

## 2023-12-08 LAB — IBC + FERRITIN
Ferritin: 7.4 ng/mL — ABNORMAL LOW (ref 10.0–291.0)
Iron: 76 ug/dL (ref 42–145)
Saturation Ratios: 20.6 % (ref 20.0–50.0)
TIBC: 368.2 ug/dL (ref 250.0–450.0)
Transferrin: 263 mg/dL (ref 212.0–360.0)

## 2023-12-08 LAB — LIPID PANEL
Cholesterol: 148 mg/dL (ref 0–200)
HDL: 58.5 mg/dL (ref >39.00)
LDL Cholesterol: 76 mg/dL (ref 0–99)
NonHDL: 89.93
Total CHOL/HDL Ratio: 3
Triglycerides: 72 mg/dL (ref 0.0–149.0)
VLDL: 14.4 mg/dL (ref 0.0–40.0)

## 2023-12-08 LAB — VITAMIN D 25 HYDROXY (VIT D DEFICIENCY, FRACTURES): VITD: 37.55 ng/mL (ref 30.00–100.00)

## 2023-12-08 LAB — HEMOGLOBIN A1C: Hgb A1c MFr Bld: 5.7 % (ref 4.6–6.5)

## 2023-12-08 MED ORDER — METFORMIN HCL ER 500 MG PO TB24: 500.0000 mg | ORAL_TABLET | Freq: Every day | ORAL | 1 refills | Status: AC

## 2023-12-08 MED ORDER — PHENTERMINE HCL 8 MG PO TABS: ORAL_TABLET | ORAL | 3 refills | Status: AC

## 2023-12-08 NOTE — Progress Notes (Signed)
 .  Subjective:  Patient ID: Marilyn Wilkinson, female    DOB: 29-May-1980  Age: 43 y.o. MRN: [id number]  Patient Care Team: Corwin Antu, FNP as PCP - General (Family Medicine) Dann Candyce RAMAN, MD as PCP - Cardiology (Cardiology)   CC: No chief complaint on file.   HPI ANY Marilyn Wilkinson is a 43 y.o. female who presents today for an annual physical exam. She reports consuming a general diet. Treadmill three times a week for 30 min at a time She generally feels well. She reports sleeping well. She does not have additional problems to discuss today.   Vision:Not within last year Dental:No current dental problems  Last pap: partial hysterectomy with partial salpingectomy still with ovaries   Pt is with acute concerns.   Discussed the use of AI scribe software for clinical note transcription with the patient, who gave verbal consent to proceed.  History of Present Illness Marilyn Wilkinson is a 43 year old female who presents for an annual physical exam.  She has a history of gastric bypass surgery in 2004 and polycystic ovary syndrome (PCOS). Despite the surgery, she experiences cycles of weight loss and regain. She has been inconsistent with gym attendance and strength training. She has tried various weight management strategies, including phentermine  and a program focused on healthy weight and wellness, but has not found them effective long-term.  She takes vitamin D , iron , and B12 supplements regularly. She also takes Topamax  for headaches and metformin  for PCOS, which she takes daily. She has experienced some stomach discomfort with metformin  but not consistently. She has not been taking phentermine  recently due to doubts about its effectiveness.  She has not followed up with a bariatric specialist since her surgery, which was performed in [place]  21 years ago. She is not currently experiencing high blood pressure or headaches related to her medication use. She has not had an eye exam in two  years and is due for a tetanus shot. She has had a hysterectomy but retains her ovaries and one fallopian tube.  Originally from [place] , she now resides in [place]  and has children.    Advanced Directives Patient does not have advanced directives    DEPRESSION SCREENING    12/08/2023    8:20 AM 09/23/2023    8:05 AM 02/26/2023    9:35 AM 07/21/2022    3:43 PM 06/19/2022    8:02 AM 10/09/2021    9:52 AM 11/22/2020    8:51 AM  PHQ 2/9 Scores  PHQ - 2 Score 2 1 1  0 1 1 0  PHQ- 9 Score 5 4 4 3   1      ROS: Negative unless specifically indicated above in HPI.    Current Outpatient Medications:    aspirin -acetaminophen -caffeine (EXCEDRIN MIGRAINE) 250-250-65 MG tablet, Take 1 tablet by mouth every 6 (six) hours as needed for headache., Disp: , Rfl:    cetirizine (ZYRTEC) 10 MG tablet, Take 10 mg by mouth daily., Disp: , Rfl:    metFORMIN  (GLUCOPHAGE -XR) 500 MG 24 hr tablet, Take 1 tablet (500 mg total) by mouth daily with breakfast., Disp: 360 tablet, Rfl: 1   naproxen sodium (ALEVE) 220 MG tablet, Take 220 mg by mouth., Disp: , Rfl:    pseudoephedrine  (SUDAFED) 120 MG 12 hr tablet, Take 120 mg by mouth 2 (two) times daily., Disp: , Rfl:    SUMAtriptan  (IMITREX ) 50 MG tablet, TAKE 1 TABLET AS NEEDED FOR MIGRAINE, MAY REPEAT IN 2 HOURS X  1 DOSE IF HEADACHE PERSISTS OR RECURS., Disp: 10 tablet, Rfl: 0   topiramate  (TOPAMAX ) 50 MG tablet, Take 1 tablet (50 mg total) by mouth daily., Disp: 90 tablet, Rfl: 1   Phentermine  HCl 8 MG TABS, Take 1/2 tablet for two weeks then increase to one tablet once daily, Disp: 30 tablet, Rfl: 3    Objective:    BP 122/86 (BP Location: Left Arm, Patient Position: Sitting, Cuff Size: Large)   Pulse 88   Temp 98.1 F (36.7 C) (Temporal)   Ht 5' 6 (1.676 m)   Wt 293 lb 3.2 oz (133 kg)   LMP 12/12/2018   SpO2 98%   BMI 47.32 kg/m   BP Readings from Last 3 Encounters:  12/08/23 122/86  10/19/23 (!) 139/100  09/23/23 (!) 128/91       Physical Exam Constitutional:      General: She is not in acute distress.    Appearance: Normal appearance. She is obese. She is not ill-appearing.  HENT:     Head: Normocephalic.     Right Ear: Tympanic membrane normal.     Left Ear: Tympanic membrane normal.     Nose: Nose normal.     Mouth/Throat:     Mouth: Mucous membranes are moist.  Eyes:     Extraocular Movements: Extraocular movements intact.     Pupils: Pupils are equal, round, and reactive to light.  Cardiovascular:     Rate and Rhythm: Normal rate and regular rhythm.  Pulmonary:     Effort: Pulmonary effort is normal.     Breath sounds: Normal breath sounds.  Musculoskeletal:        General: Normal range of motion.     Cervical back: Normal range of motion.  Skin:    General: Skin is warm.     Capillary Refill: Capillary refill takes less than 2 seconds.  Neurological:     General: No focal deficit present.     Mental Status: She is alert.  Psychiatric:        Mood and Affect: Mood normal.        Behavior: Behavior normal.        Thought Content: Thought content normal.        Judgment: Judgment normal.       Results       Assessment & Plan:   Assessment and Plan Assessment & Plan Adult Wellness Visit Routine adult wellness visit with discussion on general health maintenance, including vaccinations and screenings. - Administered tetanus vaccine - Confirmed flu vaccine was administered - Discussed HPV vaccine; she declined Patient Counseling(The following topics were reviewed):  Preventative care handout given to pt  Health maintenance and immunizations reviewed. Please refer to Health maintenance section. Pt advised on safe sex, wearing seatbelts in car, and proper nutrition labwork ordered today for annual Dental health: Discussed importance of regular tooth brushing, flossing, and dental visits.   Morbid obesity due to excess calories Chronic morbid obesity with difficulty maintaining  weight loss. Previous gastric bypass in 2004. Current challenges include inconsistent exercise and dietary habits. Discussed importance of strength training and balanced diet with adequate protein and fiber. Consideration of bariatric surgery revision, though unlikely due to previous bypass. Discussed potential benefits of phentermine  and metformin  for weight management. - Increased metformin  to extended release 500 mg, instructed to take two tablets in the morning and one at night, then increase to two tablets twice daily if tolerated - Restart phentermine  at half tablet, then increase  as tolerated - Encouraged strength training twice a week - Will consider referral to dietitian for nutritional counseling  Polycystic ovary syndrome (PCOS) PCOS contributing to weight management challenges. Current treatment includes metformin , which may be increased to better address PCOS symptoms. - Increased metformin  as outlined under Morbid obesity due to excess calories  Migraine Chronic migraines managed with Topamax  and as-needed medications. No new acute issues reported. - Continue current migraine management regimen  Elevated blood pressure, without diagnosis of hypertension Elevated blood pressure without formal diagnosis of hypertension. No current antihypertensive treatment. Discussed monitoring blood pressure, especially when restarting phentermine . 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 No current follow-up with bariatric surgery team.           Follow-up: Return in about 6 months (around 06/07/2024) for f/u weight loss medication.   Ginger [name], FNP

## 2023-12-09 ENCOUNTER — Ambulatory Visit: Payer: Self-pay | Admitting: Family

## 2024-02-10 ENCOUNTER — Ambulatory Visit: Admitting: Podiatry

## 2024-03-01 ENCOUNTER — Encounter: Admitting: Family
